# Patient Record
Sex: Female | Born: 1935 | Race: White | Hispanic: No | Smoking: Never smoker
Health system: Southern US, Community
[De-identification: ages and names within clinical notes are randomized; demographics above are authoritative.]

## PROBLEM LIST (undated history)

## (undated) DIAGNOSIS — I482 Chronic atrial fibrillation, unspecified: Secondary | ICD-10-CM

## (undated) DIAGNOSIS — I10 Essential (primary) hypertension: Secondary | ICD-10-CM

## (undated) DIAGNOSIS — M1990 Unspecified osteoarthritis, unspecified site: Secondary | ICD-10-CM

## (undated) DIAGNOSIS — S8992XA Unspecified injury of left lower leg, initial encounter: Secondary | ICD-10-CM

## (undated) DIAGNOSIS — IMO0001 Reserved for inherently not codable concepts without codable children: Secondary | ICD-10-CM

## (undated) DIAGNOSIS — D469 Myelodysplastic syndrome, unspecified: Secondary | ICD-10-CM

## (undated) DIAGNOSIS — Z5189 Encounter for other specified aftercare: Secondary | ICD-10-CM

## (undated) DIAGNOSIS — E785 Hyperlipidemia, unspecified: Secondary | ICD-10-CM

## (undated) HISTORY — PX: TONSILLECTOMY: SUR1361

## (undated) HISTORY — DX: Unspecified osteoarthritis, unspecified site: M19.90

## (undated) HISTORY — DX: Reserved for inherently not codable concepts without codable children: IMO0001

## (undated) HISTORY — DX: Myelodysplastic syndrome, unspecified: D46.9

## (undated) HISTORY — DX: Hyperlipidemia, unspecified: E78.5

## (undated) HISTORY — PX: ABDOMINAL HYSTERECTOMY: SHX81

## (undated) HISTORY — DX: Essential (primary) hypertension: I10

---

## 1998-06-17 ENCOUNTER — Other Ambulatory Visit: Admission: RE | Admit: 1998-06-17 | Discharge: 1998-06-17 | Payer: Self-pay | Admitting: Family Medicine

## 2000-05-03 ENCOUNTER — Other Ambulatory Visit: Admission: RE | Admit: 2000-05-03 | Discharge: 2000-05-03 | Payer: Self-pay | Admitting: Family Medicine

## 2002-11-26 ENCOUNTER — Encounter: Payer: Self-pay | Admitting: Family Medicine

## 2002-11-26 ENCOUNTER — Encounter: Admission: RE | Admit: 2002-11-26 | Discharge: 2002-11-26 | Payer: Self-pay | Admitting: Family Medicine

## 2007-07-31 ENCOUNTER — Ambulatory Visit (HOSPITAL_COMMUNITY): Admission: RE | Admit: 2007-07-31 | Discharge: 2007-07-31 | Payer: Self-pay | Admitting: Ophthalmology

## 2007-08-21 ENCOUNTER — Ambulatory Visit (HOSPITAL_COMMUNITY): Admission: RE | Admit: 2007-08-21 | Discharge: 2007-08-21 | Payer: Self-pay | Admitting: Ophthalmology

## 2011-07-26 LAB — BASIC METABOLIC PANEL
BUN: 14
CO2: 29
Calcium: 9.4
Chloride: 103
Creatinine, Ser: 1.19
GFR calc Af Amer: 52 — ABNORMAL LOW
GFR calc non Af Amer: 43 — ABNORMAL LOW
Glucose, Bld: 94
Potassium: 3.9
Sodium: 139

## 2011-07-26 LAB — HEMOGLOBIN AND HEMATOCRIT, BLOOD
HCT: 35.8 — ABNORMAL LOW
Hemoglobin: 12.3

## 2011-10-24 ENCOUNTER — Encounter (INDEPENDENT_AMBULATORY_CARE_PROVIDER_SITE_OTHER): Payer: Medicare Other | Admitting: *Deleted

## 2011-10-24 ENCOUNTER — Other Ambulatory Visit: Payer: Self-pay | Admitting: *Deleted

## 2011-10-24 DIAGNOSIS — R0989 Other specified symptoms and signs involving the circulatory and respiratory systems: Secondary | ICD-10-CM

## 2011-10-24 DIAGNOSIS — R42 Dizziness and giddiness: Secondary | ICD-10-CM

## 2012-02-04 ENCOUNTER — Telehealth: Payer: Self-pay | Admitting: *Deleted

## 2012-02-04 NOTE — Telephone Encounter (Signed)
patient refused to come and see dr.rubin did not want to schedule anything until she goes back to see dr.moore in July

## 2012-02-13 ENCOUNTER — Telehealth: Payer: Self-pay | Admitting: Oncology

## 2012-02-13 NOTE — Telephone Encounter (Signed)
Made a follow up call to [facility] re pt refusing new pt appt. S/w gina and per gina they did receive our notification re pt declining appt.

## 2012-09-10 ENCOUNTER — Other Ambulatory Visit: Payer: Self-pay

## 2012-09-10 ENCOUNTER — Telehealth: Payer: Self-pay | Admitting: Hematology & Oncology

## 2012-09-10 ENCOUNTER — Telehealth: Payer: Self-pay | Admitting: Oncology

## 2012-09-10 NOTE — Telephone Encounter (Signed)
Pt aware of 12-6

## 2012-09-10 NOTE — Telephone Encounter (Signed)
Pt decided to go to Dr. Gustavo Lah office

## 2012-09-10 NOTE — Telephone Encounter (Signed)
np pt referral from Dr. Diamond Nickel pt and pt stated that she will call back.

## 2012-09-19 ENCOUNTER — Ambulatory Visit: Payer: Medicare Other

## 2012-09-19 ENCOUNTER — Ambulatory Visit (HOSPITAL_BASED_OUTPATIENT_CLINIC_OR_DEPARTMENT_OTHER): Payer: Medicare Other | Admitting: Lab

## 2012-09-19 ENCOUNTER — Ambulatory Visit (HOSPITAL_BASED_OUTPATIENT_CLINIC_OR_DEPARTMENT_OTHER): Payer: Medicare Other | Admitting: Medical

## 2012-09-19 ENCOUNTER — Encounter (HOSPITAL_COMMUNITY)
Admission: RE | Admit: 2012-09-19 | Discharge: 2012-09-19 | Disposition: A | Discharge status: Home / Self Care | Payer: Medicare Other | Source: Ambulatory Visit | Attending: Hematology & Oncology | Admitting: Hematology & Oncology

## 2012-09-19 VITALS — BP 145/49 | HR 86 | Temp 98.3°F | Resp 18 | Ht 60.0 in | Wt 132.0 lb

## 2012-09-19 DIAGNOSIS — D649 Anemia, unspecified: Secondary | ICD-10-CM

## 2012-09-19 LAB — MANUAL DIFFERENTIAL (CHCC SATELLITE)
ALC: 0.9 10*3/uL (ref 0.6–2.2)
ANC (CHCC HP manual diff): 4.5 10*3/uL (ref 1.5–6.7)
Band Neutrophils: 4 % (ref 0–10)
SEG: 75 % (ref 40–75)

## 2012-09-19 LAB — CBC WITH DIFFERENTIAL (CANCER CENTER ONLY)
HCT: 21.7 % — ABNORMAL LOW (ref 34.8–46.6)
MCH: 40 pg — ABNORMAL HIGH (ref 26.0–34.0)
MCHC: 33.2 g/dL (ref 32.0–36.0)
RDW: 13.4 % (ref 11.1–15.7)

## 2012-09-19 LAB — PREPARE RBC (CROSSMATCH)

## 2012-09-19 LAB — CHCC SATELLITE - SMEAR

## 2012-09-19 NOTE — H&P (Signed)
[facility] NEW PATIENT EVALUATION   Name: Monica Riggs Date: 09/19/2012 MRN: [id number] DOB: January 17, 1919   REFERRING PHYSICIAN: Rudi Heap, M.D.  REASON FOR REFERRAL:  Anemia  HISTORY OF PRESENT ILLNESS: Monica Riggs is a 78 y.o. female who is who was kindly referred to [place] for further evaluation of her anemia.  Monica Riggs is a very pleasant elderly, female, who, for the most part, has been quite healthy.  Monica Riggs daughter and granddaughter accompany her.  Today.  Monica Riggs, reports, that she is been recently followed by her primary care physician for anemia.  Over the past several months.  Her hemoglobin has been holding steady around the 8 range.  It was felt that her anemia of was unclear of etiology, and she needed to be referred to hematologist.  Monica Riggs states, that as far.  She, knows she's never had to have a blood transfusion.  She was never told that she had any type of iron deficiency.  She reports, that she's not been symptomatic.  She does not have any syncopal spells, or dizziness.  She does not have excessive weakness, or fatigue.  She does not hear her heartbeat in her ears.  She is never craves ice.  She is able to perform our her activities of daily living without any hindrance or decline.  She does report, that over the past year.  She has had a decreased in appetite.  She has had some unintentional weight loss over the past 4 months.  She does have issues with constipation.  She denies any type of nausea, vomiting, any cough, chest pain, shortness of breath, fevers, chills, or night sweats.  She denies any palpable adenopathy.  She denies any headaches, visual changes, or rashes.  She denies any odynophagia or dysphasia.  She denies any type of lower leg swelling.  She denies any type of obvious, or abnormal bleeding.  She is 51, and gets around quite remarkable.  She still continues to do things for herself.  She does have a little bit of renal  insufficiency.  Her creatinine was 1.13.  Her LDH is normal at 168.  Her vitamin B12 is 1359.  We are getting a serum protein electrophoresis and erythropoietin level.  Her MCV is quite elevated at 121.  This could potentially signify a myelodysplastic syndrome.  Her hemoglobin today is 7.2.  I do feel that we should go ahead and transfuse her with 2 units of packed red blood cells.  This was discussed with Monica Riggs, as well as her family members.  She is agreeable to this and we will set her up for transfusion on Monday, December 9.   PAST MEDICAL HISTORY:  Hypertension, Hyperlipidemia, Hypothyroidism    PAST SURGICAL HISTORY: Hysterectomy, Tonsillectomy  Current Outpatient Prescriptions on File Prior to Visit  Medication Sig Dispense Refill  . atorvastatin (LIPITOR) 80 MG tablet Take 80 mg by mouth daily. Take 1/2 tablet (40mg ) daily      . diltiazem (TIAZAC) 300 MG 24 hr capsule Take 300 mg by mouth daily.      . ferrous sulfate 325 (65 FE) MG tablet Take 325 mg by mouth 2 (two) times daily.      . hydrochlorothiazide (HYDRODIURIL) 25 MG tablet Take 25 mg by mouth daily.      Marland Kitchen levothyroxine (SYNTHROID, LEVOTHROID) 25 MCG tablet Take 25 mcg by mouth daily.      Marland Kitchen olmesartan (BENICAR) 40 MG tablet Take 40 mg  by mouth daily. Take 1/2 talblet (20mg ) daily      . omega-3 acid ethyl esters (LOVAZA) 1 G capsule Take 2 g by mouth daily.       Peripheral smear  shows a well defined population of red blood cells. There is no  polychromasia. I do not see any nucleated red blood cells. There are  no schistocytes. She had no rouleaux formation. I saw no target cells.  There are no sickle cells. Her white cells appear normal in morphology  and maturation. She has no hypersegmented polys. There are no immature  myeloid or lymphoid forms.  I do not see any blasts. Platelets were well-  Granulated and normal size.  ALLERGIES: Sulfa drugs  SOCIAL HISTORY: She is widowed.  She has 2 twin daughters.   She lives in [place].  She does not have any history of tobacco use.  She denies any alcohol use or history of illicit drug use.  FAMILY HISTORY: Non-Contributory  Laboratory Data: White count 5.7, hemoglobin 7.2, hematocrit 21.7, MCV 121, platelets 215,000  Review of Systems: Constitutional:Negative for malaise/fatigue, fever, chills, weight loss, diaphoresis, activity change, appetite change, and unexpected weight change.  HEENT: Negative for double vision, blurred vision, visual loss, ear pain, tinnitus, congestion, rhinorrhea, epistaxis sore throat or sinus disease, oral pain/lesion, tongue soreness Respiratory: Negative for cough, chest tightness, shortness of breath, wheezing and stridor.  Cardiovascular: Negative for chest pain, palpitations, leg swelling, orthopnea, PND, DOE or claudication Gastrointestinal: Negative for nausea, vomiting, abdominal pain, diarrhea, constipation, blood in stool, melena, hematochezia, abdominal distention, anal bleeding, rectal pain, anorexia and hematemesis.  Genitourinary: Negative for dysuria, frequency, hematuria,  Musculoskeletal: Negative for myalgias, back pain, joint swelling, arthralgias and gait problem.  Skin: Negative for rash, color change, pallor and wound.  Neurological:. Negative for dizziness/light-headedness, tremors, seizures, syncope, facial asymmetry, speech difficulty, weakness, numbness, headaches and paresthesias.  Hematological: Negative for adenopathy. Does not bruise/bleed easily.  Psychiatric/Behavioral:  Negative for depression, no loss of interest in normal activity or change in sleep pattern.   Physical Exam: This is a pleasant, elderly, 78 year old, white female, in no obvious distress Vitals: Temperature 97.0 degrees, pulse 72, respirations 18, blood pressure 120/70, weight 132 pounds HEENT reveals a normocephalic, atraumatic skull, no scleral icterus, no oral lesions  Neck is supple without any cervical or  supraclavicular adenopathy.  Lungs are clear to auscultation bilaterally. There are no wheezes, rales or rhonci Cardiac is regular rate and rhythm with a normal S1 and S2. There are no murmurs, rubs, or bruits.  Abdomen is soft with good bowel sounds, there is no palpable mass. There is no palpable hepatosplenomegaly. There is no palpable fluid wave.  Musculoskeletal no tenderness of the spine, ribs, or hips.  Extremities there are no clubbing, cyanosis, or edema.  Skin no petechia, purpura or ecchymosis Neurologic is nonfocal.   Assessment/Plan: This is a pleasant, 78 year old, female, with the following issues:  #1.  Anemia.  With an elevated MCV this could indicate a vitamin B12 deficiency, severe homolysis, or myelodysplastic syndrome.  Images Gohr's case, this is more than likely MDS.  The only true way to check for MDS is with a bone marrow, biopsy.  I just think that we do not need to put Ms. Ardelean through this.  We are checking an erythropoietin level on her.  We will have to see what this level is.  She may benefit from Aranesp.  Her peripheral smear looked good.  There is no rouleaux  formation or nucleated red blood cells. We will go ahead and transfuse.  Ms. Stanwood, with 2 units of packed red blood cells on Monday.  We will await the results for the rest of her blood workAnd bring her back in a few weeks to discuss further options.  Again, Ms. Fulp is a remarkable 78 year old, female, who is quite independent and has a good quality of life.  .  #2.  Followup.  We will follow back up with Ms. Sisler in 2 weeks, but before then should there be questions or concerns.

## 2012-09-22 ENCOUNTER — Other Ambulatory Visit: Payer: Self-pay | Admitting: Lab

## 2012-09-22 ENCOUNTER — Ambulatory Visit (HOSPITAL_BASED_OUTPATIENT_CLINIC_OR_DEPARTMENT_OTHER): Payer: Medicare Other

## 2012-09-22 VITALS — BP 131/75 | HR 83 | Temp 97.2°F | Resp 18

## 2012-09-22 DIAGNOSIS — D649 Anemia, unspecified: Secondary | ICD-10-CM

## 2012-09-22 LAB — COMPREHENSIVE METABOLIC PANEL
Alkaline Phosphatase: 90 U/L (ref 39–117)
BUN: 13 mg/dL (ref 6–23)
CO2: 27 mEq/L (ref 19–32)
Glucose, Bld: 145 mg/dL — ABNORMAL HIGH (ref 70–99)
Total Bilirubin: 0.7 mg/dL (ref 0.3–1.2)

## 2012-09-22 LAB — ERYTHROPOIETIN: Erythropoietin: 718 m[IU]/mL — ABNORMAL HIGH (ref 2.6–34.0)

## 2012-09-22 LAB — LACTATE DEHYDROGENASE: LDH: 168 U/L (ref 94–250)

## 2012-09-22 LAB — VITAMIN B12: Vitamin B-12: 1359 pg/mL — ABNORMAL HIGH (ref 211–911)

## 2012-09-22 LAB — RETICULOCYTES (CHCC)
ABS Retic: 41.1 10*3/uL (ref 19.0–186.0)
Retic Ct Pct: 2.2 % (ref 0.4–2.3)

## 2012-09-22 MED ORDER — SODIUM CHLORIDE 0.9 % IV SOLN
250.0000 mL | Freq: Once | INTRAVENOUS | Status: AC
  Administered 2012-09-22: 250 mL via INTRAVENOUS

## 2012-09-22 MED ORDER — FUROSEMIDE 10 MG/ML IJ SOLN
10.0000 mg | Freq: Once | INTRAMUSCULAR | Status: AC
  Administered 2012-09-22: 10 mg via INTRAVENOUS

## 2012-09-22 MED ORDER — ACETAMINOPHEN 325 MG PO TABS: 650.0000 mg | ORAL_TABLET | Freq: Once | ORAL | Status: DC

## 2012-09-22 MED ORDER — DIPHENHYDRAMINE HCL 25 MG PO CAPS
25.0000 mg | ORAL_CAPSULE | Freq: Once | ORAL | Status: AC
  Administered 2012-09-22: 25 mg via ORAL

## 2012-09-22 MED ORDER — ACETAMINOPHEN 325 MG PO TABS
650.0000 mg | ORAL_TABLET | Freq: Once | ORAL | Status: AC
  Administered 2012-09-22: 650 mg via ORAL

## 2012-09-22 NOTE — Progress Notes (Signed)
This office note has been dictated.

## 2012-09-22 NOTE — Patient Instructions (Signed)
Blood Transfusion Information WHAT IS A BLOOD TRANSFUSION? A transfusion is the replacement of blood or some of its parts. Blood is made up of multiple cells which provide different functions.  Red blood cells carry oxygen and are used for blood loss replacement.  White blood cells fight against infection.  Platelets control bleeding.  Plasma helps clot blood.  Other blood products are available for specialized needs, such as hemophilia or other clotting disorders. BEFORE THE TRANSFUSION  Who gives blood for transfusions?   You may be able to donate blood to be used at a later date on yourself (autologous donation).  Relatives can be asked to donate blood. This is generally not any safer than if you have received blood from a stranger. The same precautions are taken to ensure safety when a relative's blood is donated.  Healthy volunteers who are fully evaluated to make sure their blood is safe. This is blood bank blood. Transfusion therapy is the safest it has ever been in the practice of medicine. Before blood is taken from a donor, a complete history is taken to make sure that person has no history of diseases nor engages in risky social behavior (examples are intravenous drug use or sexual activity with multiple partners). The donor's travel history is screened to minimize risk of transmitting infections, such as malaria. The donated blood is tested for signs of infectious diseases, such as HIV and hepatitis. The blood is then tested to be sure it is compatible with you in order to minimize the chance of a transfusion reaction. If you or a relative donates blood, this is often done in anticipation of surgery and is not appropriate for emergency situations. It takes many days to process the donated blood. RISKS AND COMPLICATIONS Although transfusion therapy is very safe and saves many lives, the main dangers of transfusion include:   Getting an infectious disease.  Developing a  transfusion reaction. This is an allergic reaction to something in the blood you were given. Every precaution is taken to prevent this. The decision to have a blood transfusion has been considered carefully by your caregiver before blood is given. Blood is not given unless the benefits outweigh the risks. AFTER THE TRANSFUSION  Right after receiving a blood transfusion, you will usually feel much better and more energetic. This is especially true if your red blood cells have gotten low (anemic). The transfusion raises the level of the red blood cells which carry oxygen, and this usually causes an energy increase.  The nurse administering the transfusion will monitor you carefully for complications. HOME CARE INSTRUCTIONS  No special instructions are needed after a transfusion. You may find your energy is better. Speak with your caregiver about any limitations on activity for underlying diseases you may have. SEEK MEDICAL CARE IF:   Your condition is not improving after your transfusion.  You develop redness or irritation at the intravenous (IV) site. SEEK IMMEDIATE MEDICAL CARE IF:  Any of the following symptoms occur over the next 12 hours:  Shaking chills.  You have a temperature by mouth above 102 F (38.9 C), not controlled by medicine.  Chest, back, or muscle pain.  People around you feel you are not acting correctly or are confused.  Shortness of breath or difficulty breathing.  Dizziness and fainting.  You get a rash or develop hives.  You have a decrease in urine output.  Your urine turns a dark color or changes to pink, red, or brown. Any of the following   symptoms occur over the next 10 days:  You have a temperature by mouth above 102 F (38.9 C), not controlled by medicine.  Shortness of breath.  Weakness after normal activity.  The white part of the eye turns yellow (jaundice).  You have a decrease in the amount of urine or are urinating less often.  Your  urine turns a dark color or changes to pink, red, or brown. Document Released: 09/28/2000 Document Revised: 12/24/2011 Document Reviewed: 05/17/2008 Exit[employer] Patient Information 2013 [employer].  

## 2012-09-24 LAB — PROTEIN ELECTROPHORESIS, SERUM, WITH REFLEX
Alpha-1-Globulin: 6.4 % — ABNORMAL HIGH (ref 2.9–4.9)
Beta 2: 4.6 % (ref 3.2–6.5)
Gamma Globulin: 12.8 % (ref 11.1–18.8)

## 2012-09-24 LAB — TYPE AND SCREEN
ABO/RH(D): O NEG
Antibody Screen: NEGATIVE
Unit division: 0

## 2012-09-25 ENCOUNTER — Encounter: Payer: Self-pay | Admitting: Hematology & Oncology

## 2012-10-06 ENCOUNTER — Other Ambulatory Visit (HOSPITAL_BASED_OUTPATIENT_CLINIC_OR_DEPARTMENT_OTHER): Payer: Medicare Other | Admitting: Lab

## 2012-10-06 ENCOUNTER — Ambulatory Visit (HOSPITAL_BASED_OUTPATIENT_CLINIC_OR_DEPARTMENT_OTHER): Payer: Medicare Other | Admitting: Hematology & Oncology

## 2012-10-06 VITALS — BP 129/57 | HR 73 | Temp 97.9°F | Resp 16 | Ht 60.0 in | Wt 127.0 lb

## 2012-10-06 DIAGNOSIS — D46Z Other myelodysplastic syndromes: Secondary | ICD-10-CM

## 2012-10-06 DIAGNOSIS — D649 Anemia, unspecified: Secondary | ICD-10-CM

## 2012-10-06 LAB — CBC WITH DIFFERENTIAL (CANCER CENTER ONLY)
HCT: 28.7 % — ABNORMAL LOW (ref 34.8–46.6)
HGB: 9.6 g/dL — ABNORMAL LOW (ref 11.6–15.9)
MCV: 103 fL — ABNORMAL HIGH (ref 81–101)
RBC: 2.79 10*6/uL — ABNORMAL LOW (ref 3.70–5.32)
WBC: 3.3 10*3/uL — ABNORMAL LOW (ref 3.9–10.0)

## 2012-10-06 LAB — MANUAL DIFFERENTIAL (CHCC SATELLITE)
ALC: 0.9 10*3/uL (ref 0.6–2.2)
Eos: 6 % (ref 0–7)
LYMPH: 27 % (ref 14–48)
MONO: 4 % (ref 0–13)

## 2012-10-06 LAB — RETICULOCYTES (CHCC)
RBC.: 2.88 MIL/uL — ABNORMAL LOW (ref 3.87–5.11)
Retic Ct Pct: 0.9 % (ref 0.4–2.3)

## 2012-10-06 NOTE — Progress Notes (Signed)
This office note has been dictated.

## 2012-10-07 NOTE — Progress Notes (Signed)
CC:   Monica Riggs, M.D.  DIAGNOSIS:  Myelodysplastic syndrome.  CURRENT THERAPY:  Palliative transfusions.  INTERIM HISTORY:  Monica Riggs comes in for followup.  This is her second office visit.  We first saw her back in early December.  At that point in time, our workup basically showed myelodysplastic issues.  She had a vitamin B12 of 1400.  LDH was 168.  She had a negative monoclonal spike in her serum.  She had an erythropoietin level of 718.  Her reticulocyte count, corrected, was less than 1.  Again, I suspect that Monica Riggs has myelodysplasia.  Her blood smear does not show any blasts.  There are no nucleated red blood cells or teardrop cells.  She has gotten 2 units of blood already.  This has helped her.  She feels better for the holidays.  She has had no bleeding.  Her appetite is okay.  She has had no change in bowel or bladder habits.  She has had some leg swelling.  I told her and her family this was likely from her anemia.  PHYSICAL EXAMINATION:  General:  This is an elderly white female in no obvious distress.  Vital signs:  97.9, pulse of 73, respiratory rate 16, blood pressure is 129/57.  Weight is 127.  Head and neck: Normocephalic, atraumatic skull.  There are no ocular or oral lesions. There are no palpable cervical or supraclavicular lymph nodes.  Lungs: Clear bilaterally.  Cardiac:  Regular rate and rhythm with a normal S1 and S2.  There are no murmurs, rubs, or bruits.  Abdomen:  Soft with good bowel sounds.  There is no palpable abdominal mass.  No palpable hepatosplenomegaly.  Extremities:  Some trace edema in her legs.  She has good range of motion of her joints.  Skin:  No rashes, ecchymoses, or petechiae.  LABORATORY STUDIES:  White cell count is 3.3, hemoglobin 9.6, hematocrit 28.7, platelet count 221.  MCV is 103.  IMPRESSION:  Monica Riggs is a 78 year old female with, highly likely, myelodysplasia.  Again, I just do not see that putting her  through a bone marrow test is going to change our management decisions.  Given her age, I just feel that transfusing her as indicated would be appropriate.  She is in agreement with this.  Her family also agrees.  Everything with her lab work and her blood smear is pointing to myelodysplasia.  She has a reticulocyte count that is incredibly low. Her blood smear does not show any obvious leukemic changes.  Again, quality of life is key.  I told Monica Riggs to take some over-the- counter folic acid and vitamin B12.  I do not see how this is going to hurt her.  I think she still needs to have her blood work checked every couple weeks.  Her family doctor can do this and just fax [place] the results.  I gave her granddaughter a prescription for this to give to her family doctor.  I want to see her back in about 2 months' time.  We can see her back sooner, if necessary.    ______________________________ Josph Macho, M.D. PRE/MEDQ  D:  10/06/2012  T:  10/07/2012  Job:  [id number]

## 2012-12-08 ENCOUNTER — Ambulatory Visit (HOSPITAL_BASED_OUTPATIENT_CLINIC_OR_DEPARTMENT_OTHER): Payer: Medicare Other | Admitting: Hematology & Oncology

## 2012-12-08 ENCOUNTER — Encounter (HOSPITAL_COMMUNITY)
Admission: RE | Admit: 2012-12-08 | Discharge: 2012-12-08 | Disposition: A | Discharge status: Home / Self Care | Payer: Medicare Other | Source: Ambulatory Visit | Attending: Hematology & Oncology | Admitting: Hematology & Oncology

## 2012-12-08 ENCOUNTER — Other Ambulatory Visit (HOSPITAL_BASED_OUTPATIENT_CLINIC_OR_DEPARTMENT_OTHER): Payer: Medicare Other | Admitting: Lab

## 2012-12-08 VITALS — BP 150/64 | HR 89 | Temp 97.5°F | Resp 16 | Ht 61.0 in | Wt 133.0 lb

## 2012-12-08 DIAGNOSIS — D4620 Refractory anemia with excess of blasts, unspecified: Secondary | ICD-10-CM

## 2012-12-08 DIAGNOSIS — D46Z Other myelodysplastic syndromes: Secondary | ICD-10-CM

## 2012-12-08 LAB — RETICULOCYTES (CHCC)
RBC.: 2.17 MIL/uL — ABNORMAL LOW (ref 3.87–5.11)
Retic Ct Pct: 2 % (ref 0.4–2.3)

## 2012-12-08 LAB — CBC WITH DIFFERENTIAL (CANCER CENTER ONLY)
HCT: 22.8 % — ABNORMAL LOW (ref 34.8–46.6)
MCH: 37.6 pg — ABNORMAL HIGH (ref 26.0–34.0)
MCV: 113 fL — ABNORMAL HIGH (ref 81–101)
RBC: 2.02 10*6/uL — ABNORMAL LOW (ref 3.70–5.32)
RDW: 19.9 % — ABNORMAL HIGH (ref 11.1–15.7)
WBC: 2.7 10*3/uL — ABNORMAL LOW (ref 3.9–10.0)

## 2012-12-08 LAB — MANUAL DIFFERENTIAL (CHCC SATELLITE)
ANC (CHCC HP manual diff): 1.6 10*3/uL (ref 1.5–6.7)
BASO: 2 % (ref 0–2)
LYMPH: 26 % (ref 14–48)
MONO: 5 % (ref 0–13)
Myelocytes: 3 % — ABNORMAL HIGH (ref 0–0)
PLT EST ~~LOC~~: ADEQUATE

## 2012-12-08 LAB — HOLD TUBE, BLOOD BANK - CHCC SATELLITE

## 2012-12-08 NOTE — Progress Notes (Signed)
This office note has been dictated.

## 2012-12-09 ENCOUNTER — Ambulatory Visit (HOSPITAL_BASED_OUTPATIENT_CLINIC_OR_DEPARTMENT_OTHER): Payer: Medicare Other

## 2012-12-09 VITALS — BP 166/79 | HR 79 | Temp 97.8°F | Resp 18

## 2012-12-09 DIAGNOSIS — D649 Anemia, unspecified: Secondary | ICD-10-CM

## 2012-12-09 DIAGNOSIS — D4620 Refractory anemia with excess of blasts, unspecified: Secondary | ICD-10-CM

## 2012-12-09 LAB — PREPARE RBC (CROSSMATCH)

## 2012-12-09 MED ORDER — SODIUM CHLORIDE 0.9 % IJ SOLN
10.0000 mL | INTRAMUSCULAR | Status: DC | PRN
  Filled 2012-12-09: qty 10

## 2012-12-09 MED ORDER — SODIUM CHLORIDE 0.9 % IV SOLN
250.0000 mL | Freq: Once | INTRAVENOUS | Status: AC
  Administered 2012-12-09: 250 mL via INTRAVENOUS

## 2012-12-09 MED ORDER — FUROSEMIDE 10 MG/ML IJ SOLN
10.0000 mg | Freq: Once | INTRAMUSCULAR | Status: AC
  Administered 2012-12-09: 10 mg via INTRAVENOUS

## 2012-12-09 MED ORDER — ACETAMINOPHEN 325 MG PO TABS
650.0000 mg | ORAL_TABLET | Freq: Once | ORAL | Status: AC
  Administered 2012-12-09: 650 mg via ORAL

## 2012-12-09 NOTE — Progress Notes (Signed)
CC:   Ernestina Penna, M.D.  DIAGNOSIS:  Myelodysplasia-refractory anemia.  CURRENT THERAPY:  Supportive transfusions as indicated.  INTERIM HISTORY:  Monica Riggs comes in for followup.  We last saw her a couple months ago.  She got a transfusion back in early December and did well with it.  She has noticed a little bit more in the way of leg swelling.  She has had a slight increase in fatigue.  There has been no cough.  She has had no fever.  She has not noticed any bleeding.  There has been no bony pain.  She has noticed some slight swelling in her lower legs.  PHYSICAL EXAMINATION:  General:  This is a elderly white female in no obvious distress.  Vital signs:  Temperature of 97.5, pulse 89, respiratory rate 16, blood pressure 150/64.  Weight is 133.  Head and neck:  Normocephalic, atraumatic skull.  There are no ocular or oral lesions.  There are no palpable cervical or supraclavicular lymph nodes. Lungs:  Clear bilaterally.  Cardiac:  Regular rate and rhythm with a normal S1 and S2.  There are no murmurs, rubs, or bruits.  Abdomen: Soft with good bowel sounds.  There is no palpable abdominal mass. There is no palpable hepatosplenomegaly.  Extremities:  Some trace edema in her lower legs.  She has good pulses in her distal extremities. Skin:  No rashes, ecchymoses, or petechiae.  LABORATORY STUDIES:  White cell count is 2.7, hemoglobin 7.6, hematocrit 22.8, platelet count is 215.  MCV is 113.  IMPRESSION:  Ms. Fike is a 79 year old white female with low-grade myelodysplasia.  Because of her age, we have not had to do a bone marrow biopsy on her.  I felt that just transfusing her supportively would be appropriate.  Her erythropoietin level is quite high, so she would not respond to ESA.  We will go ahead and transfuse her tomorrow.  I think this will make her feel better.  We will have her come back in about 3 weeks or so.  She should be able to respond nicely to the  blood as she has had only 1 transfusion to- date.    ______________________________ Josph Macho, M.D. PRE/MEDQ  D:  12/08/2012  T:  12/09/2012  Job:  [id number]

## 2012-12-09 NOTE — Patient Instructions (Signed)
Blood Transfusion Information WHAT IS A BLOOD TRANSFUSION? A transfusion is the replacement of blood or some of its parts. Blood is made up of multiple cells which provide different functions.  Red blood cells carry oxygen and are used for blood loss replacement.  White blood cells fight against infection.  Platelets control bleeding.  Plasma helps clot blood.  Other blood products are available for specialized needs, such as hemophilia or other clotting disorders. BEFORE THE TRANSFUSION  Who gives blood for transfusions?   You may be able to donate blood to be used at a later date on yourself (autologous donation).  Relatives can be asked to donate blood. This is generally not any safer than if you have received blood from a stranger. The same precautions are taken to ensure safety when a relative's blood is donated.  Healthy volunteers who are fully evaluated to make sure their blood is safe. This is blood bank blood. Transfusion therapy is the safest it has ever been in the practice of medicine. Before blood is taken from a donor, a complete history is taken to make sure that person has no history of diseases nor engages in risky social behavior (examples are intravenous drug use or sexual activity with multiple partners). The donor's travel history is screened to minimize risk of transmitting infections, such as malaria. The donated blood is tested for signs of infectious diseases, such as HIV and hepatitis. The blood is then tested to be sure it is compatible with you in order to minimize the chance of a transfusion reaction. If you or a relative donates blood, this is often done in anticipation of surgery and is not appropriate for emergency situations. It takes many days to process the donated blood. RISKS AND COMPLICATIONS Although transfusion therapy is very safe and saves many lives, the main dangers of transfusion include:   Getting an infectious disease.  Developing a  transfusion reaction. This is an allergic reaction to something in the blood you were given. Every precaution is taken to prevent this. The decision to have a blood transfusion has been considered carefully by your caregiver before blood is given. Blood is not given unless the benefits outweigh the risks. AFTER THE TRANSFUSION  Right after receiving a blood transfusion, you will usually feel much better and more energetic. This is especially true if your red blood cells have gotten low (anemic). The transfusion raises the level of the red blood cells which carry oxygen, and this usually causes an energy increase.  The nurse administering the transfusion will monitor you carefully for complications. HOME CARE INSTRUCTIONS  No special instructions are needed after a transfusion. You may find your energy is better. Speak with your caregiver about any limitations on activity for underlying diseases you may have. SEEK MEDICAL CARE IF:   Your condition is not improving after your transfusion.  You develop redness or irritation at the intravenous (IV) site. SEEK IMMEDIATE MEDICAL CARE IF:  Any of the following symptoms occur over the next 12 hours:  Shaking chills.  You have a temperature by mouth above 102 F (38.9 C), not controlled by medicine.  Chest, back, or muscle pain.  People around you feel you are not acting correctly or are confused.  Shortness of breath or difficulty breathing.  Dizziness and fainting.  You get a rash or develop hives.  You have a decrease in urine output.  Your urine turns a dark color or changes to pink, red, or brown. Any of the following   symptoms occur over the next 10 days:  You have a temperature by mouth above 102 F (38.9 C), not controlled by medicine.  Shortness of breath.  Weakness after normal activity.  The white part of the eye turns yellow (jaundice).  You have a decrease in the amount of urine or are urinating less often.  Your  urine turns a dark color or changes to pink, red, or brown. Document Released: 09/28/2000 Document Revised: 12/24/2011 Document Reviewed: 05/17/2008 Exit[employer] Patient Information 2013 [employer].  

## 2012-12-10 LAB — TYPE AND SCREEN: Unit division: 0

## 2012-12-25 ENCOUNTER — Telehealth: Payer: Self-pay | Admitting: Hematology & Oncology

## 2012-12-25 NOTE — Telephone Encounter (Signed)
Pt moved 3-24 to 3-31

## 2013-01-05 ENCOUNTER — Other Ambulatory Visit: Payer: Medicare Other | Admitting: Lab

## 2013-01-05 ENCOUNTER — Ambulatory Visit: Payer: Medicare Other | Admitting: Medical

## 2013-01-12 ENCOUNTER — Other Ambulatory Visit (HOSPITAL_BASED_OUTPATIENT_CLINIC_OR_DEPARTMENT_OTHER): Payer: Medicare Other | Admitting: Lab

## 2013-01-12 ENCOUNTER — Ambulatory Visit (HOSPITAL_BASED_OUTPATIENT_CLINIC_OR_DEPARTMENT_OTHER): Payer: Medicare Other | Admitting: Medical

## 2013-01-12 VITALS — BP 121/60 | HR 69 | Temp 97.4°F | Resp 16 | Ht 61.0 in | Wt 130.0 lb

## 2013-01-12 DIAGNOSIS — D4620 Refractory anemia with excess of blasts, unspecified: Secondary | ICD-10-CM

## 2013-01-12 DIAGNOSIS — D46Z Other myelodysplastic syndromes: Secondary | ICD-10-CM

## 2013-01-12 LAB — MANUAL DIFFERENTIAL (CHCC SATELLITE)
BASO: 1 % (ref 0–2)
Eos: 6 % (ref 0–7)
MONO: 6 % (ref 0–13)
PLT EST ~~LOC~~: ADEQUATE
SEG: 63 % (ref 40–75)

## 2013-01-12 LAB — CBC WITH DIFFERENTIAL (CANCER CENTER ONLY)
HGB: 9 g/dL — ABNORMAL LOW (ref 11.6–15.9)
MCH: 35.7 pg — ABNORMAL HIGH (ref 26.0–34.0)
Platelets: 170 10*3/uL (ref 145–400)
RBC: 2.52 10*6/uL — ABNORMAL LOW (ref 3.70–5.32)
WBC: 2.5 10*3/uL — ABNORMAL LOW (ref 3.9–10.0)

## 2013-01-12 LAB — HOLD TUBE, BLOOD BANK - CHCC SATELLITE

## 2013-01-12 LAB — CHCC SATELLITE - SMEAR

## 2013-01-12 LAB — RETICULOCYTES (CHCC)
ABS Retic: 41.8 10*3/uL (ref 19.0–186.0)
RBC.: 2.61 MIL/uL — ABNORMAL LOW (ref 3.87–5.11)
Retic Ct Pct: 1.6 % (ref 0.4–2.3)

## 2013-01-12 NOTE — Progress Notes (Signed)
DIAGNOSIS:  Myelodysplastic syndrome.  CURRENT THERAPY:  Palliative transfusions.  INTERIM HISTORY: Monica Riggs presents today for an office followup visit. .  Overall, she, reports, that she's doing well.  She still continues to remain active.  The last, time, she received a blood transfusion was February, 24th 2014.  Her hemoglobin today is 9.0.  She goes every few weeks to her primary care physician for lab work, and they faxed it over to [place].  Her blood, has been holding relatively stable since her last transfusion.  She's not complaining of any increase in fatigue, or weakness.  She's not reporting any type of shortness of breath.  She has a good appetite.  She denies any nausea, vomiting, diarrhea, constipation, chest pain, shortness of breath, or cough.  She denies any fevers, chills, or night sweats.  She denies any lower leg swelling.  She denies any obvious, or abnormal bleeding.  She denies any headaches, visual changes, or rashes.  Review of Systems: Constitutional:Negative for malaise/fatigue, fever, chills, weight loss, diaphoresis, activity change, appetite change, and unexpected weight change.  HEENT: Negative for double vision, blurred vision, visual loss, ear pain, tinnitus, congestion, rhinorrhea, epistaxis sore throat or sinus disease, oral pain/lesion, tongue soreness Respiratory: Negative for cough, chest tightness, shortness of breath, wheezing and stridor.  Cardiovascular: Negative for chest pain, palpitations, leg swelling, orthopnea, PND, DOE or claudication Gastrointestinal: Negative for nausea, vomiting, abdominal pain, diarrhea, constipation, blood in stool, melena, hematochezia, abdominal distention, anal bleeding, rectal pain, anorexia and hematemesis.  Genitourinary: Negative for dysuria, frequency, hematuria,  Musculoskeletal: Negative for myalgias, back pain, joint swelling, arthralgias and gait problem.  Skin: Negative for rash, color change, pallor and wound.   Neurological:. Negative for dizziness/light-headedness, tremors, seizures, syncope, facial asymmetry, speech difficulty, weakness, numbness, headaches and paresthesias.  Hematological: Negative for adenopathy. Does not bruise/bleed easily.  Psychiatric/Behavioral:  Negative for depression, no loss of interest in normal activity or change in sleep pattern.   Physical Exam: This is a pleasant, elderly, 79 year old, white female, in no obvious distress Vitals: temperature 97.4 degrees, pulse 69, respirations 16, blood pressure 121/60, weight 130 pounds  HEENT reveals a normocephalic, atraumatic skull, no scleral icterus, no oral lesions  Neck is supple without any cervical or supraclavicular adenopathy.  Lungs are clear to auscultation bilaterally. There are no wheezes, rales or rhonci Cardiac is regular rate and rhythm with a normal S1 and S2. There are no murmurs, rubs, or bruits.  Abdomen is soft with good bowel sounds, there is no palpable mass. There is no palpable hepatosplenomegaly. There is no palpable fluid wave.  Musculoskeletal no tenderness of the spine, ribs, or hips.  Extremities there are no clubbing, cyanosis, or edema.  Skin no petechia, purpura or ecchymosis Neurologic is nonfocal.  Laboratory Data:  white count 2.4, hemoglobin 9.0, hematocrit 26.5.  Platelets 170,000   Current Outpatient Prescriptions on File Prior to Visit  Medication Sig Dispense Refill  . aspirin 325 MG EC tablet Take 325 mg by mouth daily.      Marland Kitchen atorvastatin (LIPITOR) 80 MG tablet Take 80 mg by mouth daily. Take 1/2 tablet (40mg ) daily      . brimonidine (ALPHAGAN) 0.2 % ophthalmic solution Place 1 drop into both eyes 2 (two) times daily.      . cholecalciferol (VITAMIN D) 1000 UNITS tablet Take 1,000 Units by mouth daily.      Marland Kitchen co-enzyme Q-10 50 MG capsule Take 50 mg by mouth daily.      Marland Kitchen  Cyanocobalamin (VITAMIN B 12 PO) Take 1,000 mg by mouth every morning.      . diltiazem (TIAZAC) 300 MG 24 hr  capsule Take 300 mg by mouth daily.      . ferrous sulfate 325 (65 FE) MG tablet Take 325 mg by mouth 2 (two) times daily.      . folic acid (FOLVITE) 400 MCG tablet Take 400 mcg by mouth 2 (two) times daily.      . hydrochlorothiazide (HYDRODIURIL) 25 MG tablet Take 25 mg by mouth daily.      Marland Kitchen levothyroxine (SYNTHROID, LEVOTHROID) 25 MCG tablet Take 25 mcg by mouth daily.      Marland Kitchen olmesartan (BENICAR) 40 MG tablet Take 40 mg by mouth daily. Take 1/2 talblet (20mg ) daily      . omega-3 acid ethyl esters (LOVAZA) 1 G capsule Take 2 g by mouth daily.       No current facility-administered medications on file prior to visit.   Assessment/Plan: This is a pleasant, 79 year old, white female, with the following issues:  #1.  Low-grade myelodysplasia.  Again, secondary to her age, we decided not to do a bone marrow, biopsy on her.  She is agreeable to supportive transfusions only. again, she's not had to receive a blood transfusion in about one month.  Her hemoglobin has remained relatively stable.  She will continue to go to her primary care physician for lab work.  She will followup there in one week.  We will continue to monitor her blood counts closely.  #2.  Followup.  We'll follow back up with Monica Riggs in 3-4 weeks, but before then should there be questions or concerns.

## 2013-01-20 ENCOUNTER — Other Ambulatory Visit (INDEPENDENT_AMBULATORY_CARE_PROVIDER_SITE_OTHER): Payer: Medicare Other

## 2013-01-20 DIAGNOSIS — Q068 Other specified congenital malformations of spinal cord: Secondary | ICD-10-CM

## 2013-01-20 NOTE — Progress Notes (Signed)
Ordered by outside dr,([email or web address])

## 2013-01-21 LAB — CBC WITH DIFFERENTIAL/PLATELET
Basophils Relative: 3 % — ABNORMAL HIGH (ref 0–1)
Eosinophils Absolute: 0.2 10*3/uL (ref 0.0–0.7)
Eosinophils Relative: 6 % — ABNORMAL HIGH (ref 0–5)
Lymphs Abs: 0.7 10*3/uL (ref 0.7–4.0)
MCH: 35.2 pg — ABNORMAL HIGH (ref 26.0–34.0)
MCHC: 34.2 g/dL (ref 30.0–36.0)
MCV: 102.8 fL — ABNORMAL HIGH (ref 78.0–100.0)
Monocytes Relative: 10 % (ref 3–12)
Neutrophils Relative %: 54 % (ref 43–77)
Platelets: 205 10*3/uL (ref 150–400)
RBC: 2.53 MIL/uL — ABNORMAL LOW (ref 3.87–5.11)

## 2013-01-21 NOTE — Progress Notes (Signed)
Patient came in had order from another doctor

## 2013-01-26 ENCOUNTER — Telehealth: Payer: Self-pay | Admitting: *Deleted

## 2013-01-26 NOTE — Telephone Encounter (Signed)
Notified Belenda Cruise of results Copy sent to Dr Myna Hidalgo

## 2013-02-09 ENCOUNTER — Other Ambulatory Visit (INDEPENDENT_AMBULATORY_CARE_PROVIDER_SITE_OTHER): Payer: Medicare Other

## 2013-02-09 DIAGNOSIS — R5381 Other malaise: Secondary | ICD-10-CM

## 2013-02-09 DIAGNOSIS — R5383 Other fatigue: Secondary | ICD-10-CM

## 2013-02-09 LAB — CBC WITH DIFFERENTIAL/PLATELET
Basophils Absolute: 0.1 10*3/uL (ref 0.0–0.1)
Basophils Relative: 2 % — ABNORMAL HIGH (ref 0–1)
Eosinophils Relative: 8 % — ABNORMAL HIGH (ref 0–5)
HCT: 23.9 % — ABNORMAL LOW (ref 36.0–46.0)
MCHC: 34.3 g/dL (ref 30.0–36.0)
MCV: 104.4 fL — ABNORMAL HIGH (ref 78.0–100.0)
Monocytes Absolute: 0.2 10*3/uL (ref 0.1–1.0)
Platelets: 220 10*3/uL (ref 150–400)
RDW: 21.1 % — ABNORMAL HIGH (ref 11.5–15.5)

## 2013-02-09 NOTE — Progress Notes (Signed)
Fax to dr,ennever

## 2013-02-12 ENCOUNTER — Other Ambulatory Visit: Payer: Self-pay | Admitting: Medical

## 2013-02-12 ENCOUNTER — Other Ambulatory Visit: Payer: Medicare Other | Admitting: Lab

## 2013-02-12 ENCOUNTER — Ambulatory Visit: Payer: Medicare Other | Admitting: Hematology & Oncology

## 2013-02-19 ENCOUNTER — Ambulatory Visit (HOSPITAL_BASED_OUTPATIENT_CLINIC_OR_DEPARTMENT_OTHER): Payer: Medicare Other | Admitting: Hematology & Oncology

## 2013-02-19 ENCOUNTER — Other Ambulatory Visit (HOSPITAL_BASED_OUTPATIENT_CLINIC_OR_DEPARTMENT_OTHER): Payer: Medicare Other | Admitting: Lab

## 2013-02-19 ENCOUNTER — Encounter (HOSPITAL_COMMUNITY)
Admission: RE | Admit: 2013-02-19 | Discharge: 2013-02-19 | Disposition: A | Discharge status: Home / Self Care | Payer: Medicare Other | Source: Ambulatory Visit | Attending: Hematology & Oncology | Admitting: Hematology & Oncology

## 2013-02-19 VITALS — BP 134/57 | HR 73 | Temp 97.6°F | Resp 16 | Ht 61.0 in | Wt 131.0 lb

## 2013-02-19 DIAGNOSIS — Q068 Other specified congenital malformations of spinal cord: Secondary | ICD-10-CM | POA: Insufficient documentation

## 2013-02-19 DIAGNOSIS — D4620 Refractory anemia with excess of blasts, unspecified: Secondary | ICD-10-CM

## 2013-02-19 LAB — MANUAL DIFFERENTIAL (CHCC SATELLITE)
Band Neutrophils: 7 % (ref 0–10)
Eos: 7 % (ref 0–7)
LYMPH: 26 % (ref 14–48)
MONO: 10 % (ref 0–13)
PLT EST ~~LOC~~: ADEQUATE
SEG: 48 % (ref 40–75)

## 2013-02-19 LAB — CBC WITH DIFFERENTIAL (CANCER CENTER ONLY)
HCT: 24 % — ABNORMAL LOW (ref 34.8–46.6)
HGB: 7.9 g/dL — ABNORMAL LOW (ref 11.6–15.9)
MCH: 37.3 pg — ABNORMAL HIGH (ref 26.0–34.0)
MCHC: 32.9 g/dL (ref 32.0–36.0)
MCV: 113 fL — ABNORMAL HIGH (ref 81–101)

## 2013-02-19 LAB — PREPARE RBC (CROSSMATCH)

## 2013-02-19 NOTE — Progress Notes (Signed)
This office note has been dictated.

## 2013-02-20 ENCOUNTER — Ambulatory Visit (HOSPITAL_BASED_OUTPATIENT_CLINIC_OR_DEPARTMENT_OTHER): Payer: Medicare Other

## 2013-02-20 VITALS — BP 116/72 | HR 74 | Temp 97.0°F | Resp 20

## 2013-02-20 DIAGNOSIS — D4620 Refractory anemia with excess of blasts, unspecified: Secondary | ICD-10-CM

## 2013-02-20 LAB — PREPARE RBC (CROSSMATCH)

## 2013-02-20 MED ORDER — FUROSEMIDE 10 MG/ML IJ SOLN
10.0000 mg | Freq: Once | INTRAMUSCULAR | Status: AC
  Administered 2013-02-20: 10 mg via INTRAVENOUS

## 2013-02-20 MED ORDER — SODIUM CHLORIDE 0.9 % IV SOLN
250.0000 mL | Freq: Once | INTRAVENOUS | Status: AC
  Administered 2013-02-20: 250 mL via INTRAVENOUS

## 2013-02-20 MED ORDER — ACETAMINOPHEN 325 MG PO TABS
650.0000 mg | ORAL_TABLET | Freq: Once | ORAL | Status: AC
  Administered 2013-02-20: 650 mg via ORAL

## 2013-02-20 NOTE — Progress Notes (Signed)
CC:   Ernestina Penna, M.D.  DIAGNOSIS:  Myelodysplasia, refractory anemia.  CURRENT THERAPY:  Palliative transfusions.  INTERIM HISTORY:  Ms. Dekoning comes in for followup.  She is actually doing fairly well.  She has not been transfused for 3 months.  She does feel a little bit tired.  She has had no bleeding.  There has been no fever.  She has had no nausea or vomiting.  She has had a good appetite.  She does have some leg swelling.  This mostly is during the daytime.  Her medicines have not changed at all.  She is Synthroid for hypothyroidism.  PHYSICAL EXAMINATION:  General:  This is an elderly white female in no obvious distress.  Vital signs:  Show temperature of 97.6, pulse 73, respiratory rate 16, blood pressure 134/57.  Weight is 131.  Head and neck:  Shows a normocephalic, atraumatic skull.  There are no ocular or oral lesions.  There are no palpable cervical or supraclavicular lymph nodes.  Lungs:  Clear bilaterally.  Cardiac:  Regular rate and rhythm with a normal S1, S2.  She has a 1/6 systolic ejection murmur.  Abdomen: Soft with good bowel sounds.  There is no palpable abdominal mass. There is no palpable hepatosplenomegaly.  Extremities:  Show some trace edema in her legs.  Neurological:  Shows a little bit of a tremor.  LABORATORY STUDIES:  White cell count is 2.1, hemoglobin 7.9, hematocrit 24, platelet count 257.  Peripheral smear does not show any blasts.  She has no nucleated red blood cells.  I see no hypersegmented polys.  Platelets are adequate in number and size.  There is no rouleaux formation.  IMPRESSION:  Ms. Perrault is a very nice 79 year old white female with myelodysplasia.  She has refractory anemia.  We have not yet done a bone marrow biopsy on her.  I just do not think that we would benefit from doing a bone marrow biopsy.  I am sure she has abnormal cytogenetics but that really would not change our management of her.  We will go ahead and  transfuse her tomorrow.  This I think will hopefully be able to get her through the summer time.  I will plan to see her back in another 6 weeks.    ______________________________ Josph Macho, M.D. PRE/MEDQ  D:  02/19/2013  T:  02/20/2013  Job:  [id number]

## 2013-02-20 NOTE — Patient Instructions (Signed)
Blood Transfusion Information WHAT IS A BLOOD TRANSFUSION? A transfusion is the replacement of blood or some of its parts. Blood is made up of multiple cells which provide different functions.  Red blood cells carry oxygen and are used for blood loss replacement.  White blood cells fight against infection.  Platelets control bleeding.  Plasma helps clot blood.  Other blood products are available for specialized needs, such as hemophilia or other clotting disorders. BEFORE THE TRANSFUSION  Who gives blood for transfusions?   You may be able to donate blood to be used at a later date on yourself (autologous donation).  Relatives can be asked to donate blood. This is generally not any safer than if you have received blood from a stranger. The same precautions are taken to ensure safety when a relative's blood is donated.  Healthy volunteers who are fully evaluated to make sure their blood is safe. This is blood bank blood. Transfusion therapy is the safest it has ever been in the practice of medicine. Before blood is taken from a donor, a complete history is taken to make sure that person has no history of diseases nor engages in risky social behavior (examples are intravenous drug use or sexual activity with multiple partners). The donor's travel history is screened to minimize risk of transmitting infections, such as malaria. The donated blood is tested for signs of infectious diseases, such as HIV and hepatitis. The blood is then tested to be sure it is compatible with you in order to minimize the chance of a transfusion reaction. If you or a relative donates blood, this is often done in anticipation of surgery and is not appropriate for emergency situations. It takes many days to process the donated blood. RISKS AND COMPLICATIONS Although transfusion therapy is very safe and saves many lives, the main dangers of transfusion include:   Getting an infectious disease.  Developing a  transfusion reaction. This is an allergic reaction to something in the blood you were given. Every precaution is taken to prevent this. The decision to have a blood transfusion has been considered carefully by your caregiver before blood is given. Blood is not given unless the benefits outweigh the risks. AFTER THE TRANSFUSION  Right after receiving a blood transfusion, you will usually feel much better and more energetic. This is especially true if your red blood cells have gotten low (anemic). The transfusion raises the level of the red blood cells which carry oxygen, and this usually causes an energy increase.  The nurse administering the transfusion will monitor you carefully for complications. HOME CARE INSTRUCTIONS  No special instructions are needed after a transfusion. You may find your energy is better. Speak with your caregiver about any limitations on activity for underlying diseases you may have. SEEK MEDICAL CARE IF:   Your condition is not improving after your transfusion.  You develop redness or irritation at the intravenous (IV) site. SEEK IMMEDIATE MEDICAL CARE IF:  Any of the following symptoms occur over the next 12 hours:  Shaking chills.  You have a temperature by mouth above 102 F (38.9 C), not controlled by medicine.  Chest, back, or muscle pain.  People around you feel you are not acting correctly or are confused.  Shortness of breath or difficulty breathing.  Dizziness and fainting.  You get a rash or develop hives.  You have a decrease in urine output.  Your urine turns a dark color or changes to pink, red, or brown. Any of the following   symptoms occur over the next 10 days:  You have a temperature by mouth above 102 F (38.9 C), not controlled by medicine.  Shortness of breath.  Weakness after normal activity.  The white part of the eye turns yellow (jaundice).  You have a decrease in the amount of urine or are urinating less often.  Your  urine turns a dark color or changes to pink, red, or brown. Document Released: 09/28/2000 Document Revised: 12/24/2011 Document Reviewed: 05/17/2008 Exit[employer] Patient Information 2013 [employer].  

## 2013-02-21 LAB — TYPE AND SCREEN
ABO/RH(D): O NEG
Unit division: 0
Unit division: 0

## 2013-03-10 ENCOUNTER — Other Ambulatory Visit: Payer: Self-pay | Admitting: *Deleted

## 2013-03-10 MED ORDER — ATORVASTATIN CALCIUM 80 MG PO TABS: 80.0000 mg | ORAL_TABLET | Freq: Every day | ORAL | Status: DC

## 2013-03-10 MED ORDER — HYDROCHLOROTHIAZIDE 25 MG PO TABS: 25.0000 mg | ORAL_TABLET | Freq: Every day | ORAL | Status: DC

## 2013-03-20 ENCOUNTER — Other Ambulatory Visit (HOSPITAL_BASED_OUTPATIENT_CLINIC_OR_DEPARTMENT_OTHER): Payer: Medicare Other | Admitting: Lab

## 2013-03-20 ENCOUNTER — Ambulatory Visit (HOSPITAL_BASED_OUTPATIENT_CLINIC_OR_DEPARTMENT_OTHER): Payer: Medicare Other | Admitting: Medical

## 2013-03-20 VITALS — BP 93/43 | HR 83 | Temp 97.5°F | Resp 16 | Ht 61.0 in | Wt 129.0 lb

## 2013-03-20 DIAGNOSIS — D4620 Refractory anemia with excess of blasts, unspecified: Secondary | ICD-10-CM

## 2013-03-20 LAB — MANUAL DIFFERENTIAL (CHCC SATELLITE)
ALC: 1 10*3/uL (ref 0.6–2.2)
ANC (CHCC HP manual diff): 1.8 10*3/uL (ref 1.5–6.7)
LYMPH: 32 % (ref 14–48)
MONO: 3 % (ref 0–13)
PLT EST ~~LOC~~: ADEQUATE
SEG: 56 % (ref 40–75)

## 2013-03-20 LAB — CBC WITH DIFFERENTIAL (CANCER CENTER ONLY)
MCH: 35.1 pg — ABNORMAL HIGH (ref 26.0–34.0)
MCHC: 33.3 g/dL (ref 32.0–36.0)
Platelets: 209 10*3/uL (ref 145–400)
RBC: 2.68 10*6/uL — ABNORMAL LOW (ref 3.70–5.32)

## 2013-03-20 LAB — HOLD TUBE, BLOOD BANK - CHCC SATELLITE

## 2013-03-20 NOTE — Progress Notes (Signed)
DIAGNOSIS:  Myelodysplastic syndrome.  CURRENT THERAPY:  Palliative transfusions.  INTERIM HISTORY: Monica Riggs presents today for an office followup visit. .  Overall, she, reports, that she's doing well.  She still continues to remain active.  The last, time, she received a blood transfusion was May 9th 2014.  Her hemoglobin today is 9.4.  She goes every few weeks to her primary care physician for lab work, and they fax it over to [place].  Her blood, has been holding relatively stable since her last transfusion.  She's not complaining of any increase in fatigue, or weakness.  She's not reporting any type of shortness of breath.  She has a good appetite.  She denies any nausea, vomiting, diarrhea, constipation, chest pain, shortness of breath, or cough.  She denies any fevers, chills, or night sweats.  She denies any lower leg swelling.  She denies any obvious, or abnormal bleeding.  She denies any headaches, visual changes, or rashes.  Review of Systems: Constitutional:Negative for malaise/fatigue, fever, chills, weight loss, diaphoresis, activity change, appetite change, and unexpected weight change.  HEENT: Negative for double vision, blurred vision, visual loss, ear pain, tinnitus, congestion, rhinorrhea, epistaxis sore throat or sinus disease, oral pain/lesion, tongue soreness Respiratory: Negative for cough, chest tightness, shortness of breath, wheezing and stridor.  Cardiovascular: Negative for chest pain, palpitations, leg swelling, orthopnea, PND, DOE or claudication Gastrointestinal: Negative for nausea, vomiting, abdominal pain, diarrhea, constipation, blood in stool, melena, hematochezia, abdominal distention, anal bleeding, rectal pain, anorexia and hematemesis.  Genitourinary: Negative for dysuria, frequency, hematuria,  Musculoskeletal: Negative for myalgias, back pain, joint swelling, arthralgias and gait problem.  Skin: Negative for rash, color change, pallor and wound.  Neurological:.  Negative for dizziness/light-headedness, tremors, seizures, syncope, facial asymmetry, speech difficulty, weakness, numbness, headaches and paresthesias.  Hematological: Negative for adenopathy. Does not bruise/bleed easily.  Psychiatric/Behavioral:  Negative for depression, no loss of interest in normal activity or change in sleep pattern.   Physical Exam: This is a pleasant, elderly, 79 year old, white female, in no obvious distress Vitals: Temperature 97.5 degrees pulse 83 respirations 16 blood pressure 90/43 weight 129 pounds HEENT reveals a normocephalic, atraumatic skull, no scleral icterus, no oral lesions  Neck is supple without any cervical or supraclavicular adenopathy.  Lungs are clear to auscultation bilaterally. There are no wheezes, rales or rhonci Cardiac is regular rate and rhythm with a normal S1 and S2. There are no murmurs, rubs, or bruits.  Abdomen is soft with good bowel sounds, there is no palpable mass. There is no palpable hepatosplenomegaly. There is no palpable fluid wave.  Musculoskeletal no tenderness of the spine, ribs, or hips.  Extremities there are no clubbing, cyanosis, or edema.  Skin no petechia, purpura or ecchymosis Neurologic is nonfocal.  Laboratory Data:  White count 3.2 hemoglobin 9.4 hematocrit 28.2 platelets 209,000.  Current Outpatient Prescriptions on File Prior to Visit  Medication Sig Dispense Refill  . aspirin 325 MG EC tablet Take 325 mg by mouth daily.      Marland Kitchen atorvastatin (LIPITOR) 80 MG tablet Take 80 mg by mouth daily. Take 1/2 tablet (40mg ) daily      . brimonidine (ALPHAGAN) 0.2 % ophthalmic solution Place 1 drop into both eyes 2 (two) times daily.      . cholecalciferol (VITAMIN D) 1000 UNITS tablet Take 1,000 Units by mouth daily.      Marland Kitchen co-enzyme Q-10 50 MG capsule Take 50 mg by mouth daily.      . Cyanocobalamin (VITAMIN B  12 PO) Take 1,000 mg by mouth every morning.      . diltiazem (TIAZAC) 300 MG 24 hr capsule Take 300 mg by  mouth daily.      . ferrous sulfate 325 (65 FE) MG tablet Take 325 mg by mouth 2 (two) times daily.      . folic acid (FOLVITE) 400 MCG tablet Take 400 mcg by mouth 2 (two) times daily.      . hydrochlorothiazide (HYDRODIURIL) 25 MG tablet Take 25 mg by mouth daily.      Marland Kitchen levothyroxine (SYNTHROID, LEVOTHROID) 25 MCG tablet Take 25 mcg by mouth daily.      Marland Kitchen olmesartan (BENICAR) 40 MG tablet Take 40 mg by mouth daily. Take 1/2 talblet (20mg ) daily      . omega-3 acid ethyl esters (LOVAZA) 1 G capsule Take 2 g by mouth daily.       No current facility-administered medications on file prior to visit.   Assessment/Plan: This is a pleasant, 79 year old, white female, with the following issues:  #1.  Low-grade myelodysplasia.  Again, secondary to her age, we decided not to do a bone marrow, biopsy on her.  She is agreeable to supportive transfusions only.  She will continue to go to her primary care physician for lab work.  We will continue to monitor her blood counts closely.  #2.  Followup.  We'll follow back up with [name] in 6 weeks, but before then should there be questions or concerns.

## 2013-04-08 ENCOUNTER — Other Ambulatory Visit: Payer: Self-pay | Admitting: *Deleted

## 2013-04-08 MED ORDER — DILTIAZEM HCL ER BEADS 300 MG PO CP24: 300.0000 mg | ORAL_CAPSULE | Freq: Every day | ORAL | Status: DC

## 2013-04-13 ENCOUNTER — Other Ambulatory Visit (INDEPENDENT_AMBULATORY_CARE_PROVIDER_SITE_OTHER): Payer: Medicare Other

## 2013-04-13 DIAGNOSIS — Q068 Other specified congenital malformations of spinal cord: Secondary | ICD-10-CM

## 2013-04-14 ENCOUNTER — Other Ambulatory Visit: Payer: Self-pay

## 2013-04-14 MED ORDER — LEVOTHYROXINE SODIUM 25 MCG PO TABS: 25.0000 ug | ORAL_TABLET | Freq: Every day | ORAL | Status: DC

## 2013-04-22 NOTE — Progress Notes (Signed)
Patient came in for labs only.

## 2013-04-27 ENCOUNTER — Encounter: Payer: Self-pay | Admitting: Family Medicine

## 2013-04-27 ENCOUNTER — Ambulatory Visit (INDEPENDENT_AMBULATORY_CARE_PROVIDER_SITE_OTHER): Payer: Medicare Other | Admitting: Family Medicine

## 2013-04-27 VITALS — BP 122/56 | HR 84 | Temp 96.9°F | Ht 61.5 in | Wt 128.0 lb

## 2013-04-27 DIAGNOSIS — IMO0001 Reserved for inherently not codable concepts without codable children: Secondary | ICD-10-CM

## 2013-04-27 DIAGNOSIS — E559 Vitamin D deficiency, unspecified: Secondary | ICD-10-CM

## 2013-04-27 DIAGNOSIS — E039 Hypothyroidism, unspecified: Secondary | ICD-10-CM

## 2013-04-27 DIAGNOSIS — R5383 Other fatigue: Secondary | ICD-10-CM

## 2013-04-27 DIAGNOSIS — E785 Hyperlipidemia, unspecified: Secondary | ICD-10-CM

## 2013-04-27 DIAGNOSIS — Q068 Other specified congenital malformations of spinal cord: Secondary | ICD-10-CM

## 2013-04-27 NOTE — Addendum Note (Signed)
Addended by: Orma Render F on: 04/27/2013 04:15 PM   Modules accepted: Orders

## 2013-04-27 NOTE — Progress Notes (Signed)
  Subjective:    Patient ID: Monica Riggs, female    DOB: [birth year], 79 y.o.   MRN: [id number]  HPI Patient comes in today for followup of chronic medical problems and management. These problems include hyperlipidemia, hypertension, hypothyroidism, and most prominently myelodysplasia. She sees the hematologist for this. Lab work is being drawn today and a copy of this will be forwarded to her hematologist. Her last hemoglobin in March was 11.1. On June 6 her hemoglobin was 9.4.   Review of Systems  Constitutional: Positive for fatigue.  HENT: Negative.   Eyes: Negative.   Respiratory: Positive for cough (dry).   Cardiovascular: Negative.   Gastrointestinal: Positive for constipation. Negative for abdominal pain.  Endocrine: Negative.   Genitourinary: Negative.   Musculoskeletal: Positive for arthralgias (bilateral hips).  Skin: Negative.   Allergic/Immunologic: Negative.   Neurological: Positive for dizziness. Negative for headaches.  Hematological: Negative.   Psychiatric/Behavioral: Negative.        Objective:   Physical Exam BP 122/56  Pulse 84  Temp(Src) 96.9 F (36.1 C) (Oral)  Ht 5' 1.5" (1.562 m)  Wt 128 lb (58.06 kg)  BMI 23.8 kg/m2  The patient appeared well nourished and normally developed for her age, alert and oriented to time and place. Speech, behavior and judgement appear normal. Vital signs as documented.  Head exam is unremarkable. No scleral icterus or pallor noted.  Neck is without jugular venous distension, thyromegally, or carotid bruits. Carotid upstrokes are brisk bilaterally. No cervical adenopathy. Lungs are clear anteriorly and posteriorly to auscultation. Normal respiratory effort. Cardiac exam reveals regular rate and rhythm at 72/min. First and second heart sounds normal.  No murmurs, rubs or gallops.  Abdominal exam reveals normal bowl sounds, no masses, no organomegaly and no aortic enlargement. No inguinal adenopathy. There was no  tenderness to palpation. Extremities are nonedematous and both femoral and pedal pulses are normal. Skin without pallor or jaundice.  Warm and dry, without rash. Neurologic exam reveals normal deep tendon reflexes and normal sensation.          Assessment & Plan:  1. Myelodysplasia - POCT CBC; Standing  2. Hyperlipemia - Hepatic function panel; Standing - NMR Lipoprofile with Lipids; Standing  3. Hypothyroid - Thyroid Panel With TSH  4. Fatigue - POCT CBC; Standing - BASIC METABOLIC PANEL WITH GFR; Standing  5. Vitamin D deficiency - Vitamin D 25 hydroxy; Standing  Patient Instructions  Fall precautions discussed Continue current meds and therapeutic lifestyle changes

## 2013-04-27 NOTE — Patient Instructions (Addendum)
Fall precautions discussed Continue current meds and therapeutic lifestyle changes 

## 2013-04-27 NOTE — Addendum Note (Signed)
Addended by: Orma Render F on: 04/27/2013 04:16 PM   Modules accepted: Orders

## 2013-04-28 LAB — HEPATIC FUNCTION PANEL
Albumin: 4.1 g/dL (ref 3.5–5.2)
Alkaline Phosphatase: 98 U/L (ref 39–117)
Bilirubin, Direct: 0.2 mg/dL (ref 0.0–0.3)
Indirect Bilirubin: 0.5 mg/dL (ref 0.0–0.9)
Total Bilirubin: 0.7 mg/dL (ref 0.3–1.2)

## 2013-04-28 LAB — BASIC METABOLIC PANEL WITH GFR
CO2: 27 mEq/L (ref 19–32)
Chloride: 101 mEq/L (ref 96–112)
Creat: 1.02 mg/dL (ref 0.50–1.10)
GFR, Est Non African American: 48 mL/min — ABNORMAL LOW
Potassium: 4.2 mEq/L (ref 3.5–5.3)
Sodium: 139 mEq/L (ref 135–145)

## 2013-04-28 LAB — NMR LIPOPROFILE WITH LIPIDS
HDL Particle Number: 30.5 umol/L (ref >=30.5)
HDL Size: 9.6 nm (ref >=9.2)
HDL-C: 44 mg/dL (ref >=40)
LDL (calc): 42 mg/dL (ref <100)
LDL Size: 19.8 nm — ABNORMAL LOW (ref >20.5)
LP-IR Score: 29 (ref <=45)
Triglycerides: 110 mg/dL (ref <150)

## 2013-04-28 LAB — CBC WITH DIFFERENTIAL/PLATELET
Eosinophils Relative: 9 % — ABNORMAL HIGH (ref 0–5)
HCT: 20.9 % — ABNORMAL LOW (ref 36.0–46.0)
Lymphocytes Relative: 17 % (ref 12–46)
Lymphs Abs: 0.6 10*3/uL — ABNORMAL LOW (ref 0.7–4.0)
MCV: 105 fL — ABNORMAL HIGH (ref 78.0–100.0)
Neutro Abs: 2.2 10*3/uL (ref 1.7–7.7)
Platelets: 248 10*3/uL (ref 150–400)
RBC: 1.99 MIL/uL — ABNORMAL LOW (ref 3.87–5.11)
WBC: 3.5 10*3/uL — ABNORMAL LOW (ref 4.0–10.5)

## 2013-04-28 LAB — THYROID PANEL WITH TSH
Free Thyroxine Index: 3.9 (ref 1.0–3.9)
TSH: 3.62 u[IU]/mL (ref 0.350–4.500)

## 2013-05-01 ENCOUNTER — Other Ambulatory Visit: Payer: Medicare Other | Admitting: Lab

## 2013-05-01 ENCOUNTER — Ambulatory Visit: Payer: Medicare Other | Admitting: Hematology & Oncology

## 2013-05-01 ENCOUNTER — Ambulatory Visit (HOSPITAL_BASED_OUTPATIENT_CLINIC_OR_DEPARTMENT_OTHER): Payer: Medicare Other | Admitting: Hematology & Oncology

## 2013-05-01 ENCOUNTER — Ambulatory Visit (HOSPITAL_COMMUNITY)
Admission: RE | Admit: 2013-05-01 | Discharge: 2013-05-01 | Disposition: A | Discharge status: Home / Self Care | Payer: Medicare Other | Source: Ambulatory Visit | Attending: Hematology & Oncology | Admitting: Hematology & Oncology

## 2013-05-01 ENCOUNTER — Other Ambulatory Visit (HOSPITAL_BASED_OUTPATIENT_CLINIC_OR_DEPARTMENT_OTHER): Payer: Medicare Other | Admitting: Lab

## 2013-05-01 VITALS — BP 124/47 | HR 69 | Temp 97.5°F | Resp 16 | Ht 61.0 in | Wt 127.0 lb

## 2013-05-01 DIAGNOSIS — D4620 Refractory anemia with excess of blasts, unspecified: Secondary | ICD-10-CM | POA: Insufficient documentation

## 2013-05-01 DIAGNOSIS — Q068 Other specified congenital malformations of spinal cord: Secondary | ICD-10-CM

## 2013-05-01 LAB — MANUAL DIFFERENTIAL (CHCC SATELLITE)
ALC: 0.8 10*3/uL (ref 0.6–2.2)
ANC (CHCC HP manual diff): 2.4 10*3/uL (ref 1.5–6.7)
BASO: 4 % — ABNORMAL HIGH (ref 0–2)
Band Neutrophils: 2 % (ref 0–10)
LYMPH: 19 % (ref 14–48)
PLT EST ~~LOC~~: ADEQUATE
SEG: 57 % (ref 40–75)

## 2013-05-01 LAB — CBC WITH DIFFERENTIAL (CANCER CENTER ONLY)
HGB: 6.8 g/dL — CL (ref 11.6–15.9)
MCH: 38 pg — ABNORMAL HIGH (ref 26.0–34.0)
Platelets: 249 10*3/uL (ref 145–400)
RBC: 1.79 10*6/uL — ABNORMAL LOW (ref 3.70–5.32)
WBC: 4.1 10*3/uL (ref 3.9–10.0)

## 2013-05-01 NOTE — Progress Notes (Signed)
This office note has been dictated.

## 2013-05-01 NOTE — Addendum Note (Signed)
Addended by: Arlan Organ R on: 05/01/2013 03:15 PM   Modules accepted: Orders, SmartSet

## 2013-05-02 NOTE — Progress Notes (Signed)
CC:   Ernestina Penna, M.D.  DIAGNOSIS:  Refractory anemia.  CURRENT THERAPY:  Transfusions for symptom support.  INTERIM HISTORY:  [name] comes in for followup.  She is feeling a little bit tired.  I think the last time she got transfused was back in May.  She has had no bleeding.  Her appetite is down a little bit.  She has had no fever.  She has had some weight loss.  She has had no cough or shortness of breath.  Overall, her performance status is ECOG 2-3.  PHYSICAL EXAM:  General:  This is an elderly, somewhat thin white female in no obvious distress.  Vital Signs:  Temperature of 97.5, pulse 69, respiratory rate 16, blood pressure 124/47.  Weight is 127.  Head and Neck:  She has a normocephalic, atraumatic skull.  There are no ocular or oral lesions.  There are no palpable cervical or supraclavicular lymph nodes.  Lungs:  Clear bilaterally.  Cardiac:  Regular rate and rhythm with a normal S1 and S2.  There are no murmurs, rubs, or bruits. Abdomen:  Soft with good bowel sounds.  There is no palpable abdominal mass.  There is no palpable hepatosplenomegaly.  Extremities:  Show no clubbing, cyanosis, or edema.  Neurological:  Shows no focal neurological deficits.  LABORATORY STUDIES:  The white cell count is 4.1.  Hemoglobin 6.8, hematocrit 20.5, platelet count 249.  MCV is 115.  Peripheral smear shows no blasts.  She does have some metamyelocytes. There are no nucleated red blood cells.  Platelets are adequate in number and size.  I see no target cells.  IMPRESSION:  Monica Riggs is a 79 year old white female with myelodysplasia.  She has refractory anemia.  For now, we will go ahead and get her set up with another transfusion. The fact that she had been transfused 2-1/2 months ago, to me, is encouraging.  We will go ahead and plan to see her in another couple months ourselves.  She does have blood work done up at [facility].  Her  erythropoietin level when we first saw her was 718, so she would not be a candidate for ESA.    ______________________________ Josph Macho, M.D. PRE/MEDQ  D:  05/01/2013  T:  05/02/2013  Job:  [id number]

## 2013-05-04 ENCOUNTER — Ambulatory Visit (HOSPITAL_BASED_OUTPATIENT_CLINIC_OR_DEPARTMENT_OTHER): Payer: Medicare Other

## 2013-05-04 VITALS — BP 134/64 | HR 70 | Temp 98.1°F | Resp 18

## 2013-05-04 DIAGNOSIS — Q068 Other specified congenital malformations of spinal cord: Secondary | ICD-10-CM

## 2013-05-04 MED ORDER — FUROSEMIDE 10 MG/ML IJ SOLN
10.0000 mg | Freq: Once | INTRAMUSCULAR | Status: AC
  Administered 2013-05-04: 10 mg via INTRAVENOUS

## 2013-05-04 MED ORDER — ACETAMINOPHEN 325 MG PO TABS
650.0000 mg | ORAL_TABLET | Freq: Once | ORAL | Status: AC
  Administered 2013-05-04: 650 mg via ORAL

## 2013-05-04 NOTE — Patient Instructions (Addendum)
Blood Transfusion  A blood transfusion replaces your blood or some of its parts. Blood is replaced when you have lost blood because of surgery, an accident, or for severe blood conditions like anemia. You can donate blood to be used on yourself if you have a planned surgery. If you lose blood during that surgery, your own blood can be given back to you. Any blood given to you is checked to make sure it matches your blood type. Your temperature, blood pressure, and heart rate (vital signs) will be checked often.  GET HELP RIGHT AWAY IF:   You feel sick to your stomach (nauseous) or throw up (vomit).  You have watery poop (diarrhea).  You have shortness of breath or trouble breathing.  You have blood in your pee (urine) or have dark colored pee.  You have chest pain or tightness.  Your eyes or skin turn yellow (jaundice).  You have a temperature by mouth above 102 F (38.9 C), not controlled by medicine.  You start to shake and have chills.  You develop a a red rash (hives) or feel itchy.  You develop lightheadedness or feel confused.  You develop back, joint, or muscle pain.  You do not feel hungry (lost appetite).  You feel tired, restless, or nervous.  You develop belly (abdominal) cramps. Document Released: 12/28/2008 Document Revised: 12/24/2011 Document Reviewed: 12/28/2008 Exit[employer] Patient Information 2014 [employer].  

## 2013-05-05 LAB — TYPE AND SCREEN: Unit division: 0

## 2013-05-07 ENCOUNTER — Encounter: Payer: Self-pay | Admitting: Hematology & Oncology

## 2013-05-12 ENCOUNTER — Telehealth: Payer: Self-pay | Admitting: *Deleted

## 2013-05-12 NOTE — Telephone Encounter (Signed)
Message copied by Bearl Mulberry on Tue May 12, 2013 12:35 PM ------      Message from: Ernestina Penna      Created: Tue Apr 28, 2013  1:38 PM       Thyroid Is within normal limits      Electrolytes were good. Blood sugar slightly increased at 111. Kidney function tests good..      Liver function tests within normal limits      Vitamin D level is good and within normal limits      All cholesterol numbers by mask lipid testing were excellent      The CBC had a white blood cell count that was slightly decreased at 3.5. The hemoglobin was decreased at 7.2. The hematocrit was 20.9. Platelets were adequate.       ------

## 2013-05-12 NOTE — Telephone Encounter (Signed)
Pt notified of results

## 2013-05-14 ENCOUNTER — Other Ambulatory Visit: Payer: Self-pay | Admitting: *Deleted

## 2013-05-14 MED ORDER — OLMESARTAN MEDOXOMIL 40 MG PO TABS: 40.0000 mg | ORAL_TABLET | Freq: Every day | ORAL | Status: DC

## 2013-06-08 ENCOUNTER — Other Ambulatory Visit (INDEPENDENT_AMBULATORY_CARE_PROVIDER_SITE_OTHER): Payer: Medicare Other

## 2013-06-08 DIAGNOSIS — C801 Malignant (primary) neoplasm, unspecified: Secondary | ICD-10-CM

## 2013-06-08 NOTE — Progress Notes (Signed)
Patient came in for labs from dr.ennever hemoglobin recheck

## 2013-06-09 LAB — HEMOGLOBIN: Hemoglobin: 7.8 g/dL — ABNORMAL LOW (ref 11.1–15.9)

## 2013-07-02 ENCOUNTER — Ambulatory Visit (HOSPITAL_COMMUNITY)
Admission: RE | Admit: 2013-07-02 | Discharge: 2013-07-02 | Disposition: A | Discharge status: Home / Self Care | Payer: Medicare Other | Source: Ambulatory Visit | Attending: Hematology & Oncology | Admitting: Hematology & Oncology

## 2013-07-02 ENCOUNTER — Other Ambulatory Visit (HOSPITAL_BASED_OUTPATIENT_CLINIC_OR_DEPARTMENT_OTHER): Payer: Medicare Other | Admitting: Lab

## 2013-07-02 ENCOUNTER — Ambulatory Visit (HOSPITAL_BASED_OUTPATIENT_CLINIC_OR_DEPARTMENT_OTHER): Payer: Medicare Other | Admitting: Hematology & Oncology

## 2013-07-02 VITALS — BP 127/47 | HR 72 | Temp 97.2°F | Resp 14 | Ht 61.0 in | Wt 127.0 lb

## 2013-07-02 DIAGNOSIS — D4620 Refractory anemia with excess of blasts, unspecified: Secondary | ICD-10-CM

## 2013-07-02 DIAGNOSIS — D649 Anemia, unspecified: Secondary | ICD-10-CM | POA: Insufficient documentation

## 2013-07-02 LAB — CBC WITH DIFFERENTIAL (CANCER CENTER ONLY)
HCT: 20 % — ABNORMAL LOW (ref 34.8–46.6)
MCHC: 32.5 g/dL (ref 32.0–36.0)
MCV: 115 fL — ABNORMAL HIGH (ref 81–101)
Platelets: 201 10*3/uL (ref 145–400)
RDW: 21.2 % — ABNORMAL HIGH (ref 11.1–15.7)

## 2013-07-02 LAB — MANUAL DIFFERENTIAL (CHCC SATELLITE)
ALC: 0.6 10*3/uL (ref 0.6–2.2)
ANC (CHCC HP manual diff): 2.4 10*3/uL (ref 1.5–6.7)
Metamyelocytes: 1 % — ABNORMAL HIGH (ref 0–0)
SEG: 61 % (ref 40–75)

## 2013-07-02 LAB — HOLD TUBE, BLOOD BANK - CHCC SATELLITE

## 2013-07-02 NOTE — Progress Notes (Signed)
This office note has been dictated.

## 2013-07-03 ENCOUNTER — Ambulatory Visit (HOSPITAL_BASED_OUTPATIENT_CLINIC_OR_DEPARTMENT_OTHER): Payer: Medicare Other

## 2013-07-03 VITALS — BP 134/63 | HR 69 | Temp 96.9°F | Resp 20

## 2013-07-03 DIAGNOSIS — D4620 Refractory anemia with excess of blasts, unspecified: Secondary | ICD-10-CM

## 2013-07-03 MED ORDER — ACETAMINOPHEN 325 MG PO TABS
650.0000 mg | ORAL_TABLET | Freq: Once | ORAL | Status: AC
  Administered 2013-07-03: 650 mg via ORAL

## 2013-07-03 MED ORDER — SODIUM CHLORIDE 0.9 % IV SOLN
250.0000 mL | Freq: Once | INTRAVENOUS | Status: AC
  Administered 2013-07-03: 250 mL via INTRAVENOUS

## 2013-07-06 LAB — TYPE AND SCREEN: Unit division: 0

## 2013-07-08 ENCOUNTER — Encounter: Payer: Self-pay | Admitting: Hematology & Oncology

## 2013-07-13 ENCOUNTER — Other Ambulatory Visit: Payer: Self-pay

## 2013-07-13 MED ORDER — DILTIAZEM HCL ER BEADS 300 MG PO CP24: 300.0000 mg | ORAL_CAPSULE | Freq: Every day | ORAL | Status: DC

## 2013-07-13 MED ORDER — GLUCOSE BLOOD VI STRP: ORAL_STRIP | Status: DC

## 2013-07-14 NOTE — Progress Notes (Signed)
DIAGNOSIS:  Refractory anemia-low grade.  CURRENT THERAPY:  Transfusions as indicated.  INTERIM HISTORY:  Monica Riggs comes in for her followup.  She is doing okay.  She was last transfused back in July. She is starting to feel more tired.  This usually is an automatic indicator for transfusion.  She is not bleeding.  She has had a poor appetite.  Her weight has not changed however, she says that she forces herself to eat.  There has been no cough.  She has had no leg swelling.  There has been no rashes.  Overall, her performance status is ECOG 2.  PHYSICAL EXAMINATION:  General:  This is an elderly, petite, white female in no obvious distress.  Vital signs:  Temperature of 97.4, pulse 72, respiratory rate 18, blood pressure 127/47.  Weight is 127.  Head and neck:  Normocephalic, atraumatic skull.  There are no ocular or oral lesions.  She has pale conjunctivae.  She has no adenopathy in the neck. Thyroid is nonpalpable.  Lungs:  Clear bilaterally.  There are no rales, wheezes or rhonchi.  Cardiac:  Regular rate and rhythm with a normal S1 and S2.  There are no murmurs, rubs or bruits.  Abdomen:  Soft.  She has good bowel sounds.  There is no fluid wave.  There is no palpable hepatosplenomegaly.  Extremities:  No clubbing, cyanosis or edema. Neurological:  Shows no focal neurological deficits.  LABORATORY STUDIES:  White cell count is 3.3, hemoglobin 6.5, hematocrit 20, platelet count 201.  MCV is 115. On her peripheral smear, which I have reviewed, I do not see any blasts. She had macrocytic red cells.  There were no nucleated red blood cells. Platelets were adequate in number and size.  IMPRESSION:  The patient is a very nice 79 year old white female with refractory anemia.  We are transfusing her as indicated for symptoms. Again, she was last transfused back in July so she has really done quite well.  We transfused her every 2 or 3 months.  We will go ahead and plan to get her  back to see [place] in another couple months.  She has blood work done over at [facility], I think, every couple of weeks or so.  Again, I will plan to see her back myself in 2 months.  Her daughter was with her.  I reviewed the lab work with her daughter.  We will have Monica Riggs come in tomorrow for her transfusion.   ______________________________ Josph Macho, M.D. PRE/MEDQ  D:  07/02/2013  T:  07/14/2013  Job:  [id number]

## 2013-08-19 ENCOUNTER — Ambulatory Visit (HOSPITAL_COMMUNITY)
Admission: RE | Admit: 2013-08-19 | Discharge: 2013-08-19 | Disposition: A | Discharge status: Home / Self Care | Payer: Medicare Other | Source: Ambulatory Visit | Attending: Hematology & Oncology | Admitting: Hematology & Oncology

## 2013-08-19 DIAGNOSIS — D649 Anemia, unspecified: Secondary | ICD-10-CM | POA: Insufficient documentation

## 2013-08-19 DIAGNOSIS — D469 Myelodysplastic syndrome, unspecified: Secondary | ICD-10-CM | POA: Insufficient documentation

## 2013-09-03 ENCOUNTER — Ambulatory Visit (HOSPITAL_BASED_OUTPATIENT_CLINIC_OR_DEPARTMENT_OTHER): Payer: Medicare Other | Admitting: Hematology & Oncology

## 2013-09-03 ENCOUNTER — Other Ambulatory Visit (HOSPITAL_BASED_OUTPATIENT_CLINIC_OR_DEPARTMENT_OTHER): Payer: Medicare Other | Admitting: Lab

## 2013-09-03 VITALS — BP 121/53 | HR 92 | Temp 97.5°F | Resp 14 | Ht 61.0 in | Wt 131.0 lb

## 2013-09-03 DIAGNOSIS — D469 Myelodysplastic syndrome, unspecified: Secondary | ICD-10-CM

## 2013-09-03 DIAGNOSIS — D4620 Refractory anemia with excess of blasts, unspecified: Secondary | ICD-10-CM

## 2013-09-03 DIAGNOSIS — D46Z Other myelodysplastic syndromes: Secondary | ICD-10-CM

## 2013-09-03 LAB — CBC WITH DIFFERENTIAL (CANCER CENTER ONLY)
HCT: 18.8 % — ABNORMAL LOW (ref 34.8–46.6)
HGB: 6 g/dL — CL (ref 11.6–15.9)
MCV: 113 fL — ABNORMAL HIGH (ref 81–101)
RBC: 1.66 10*6/uL — ABNORMAL LOW (ref 3.70–5.32)
WBC: 3.2 10*3/uL — ABNORMAL LOW (ref 3.9–10.0)

## 2013-09-03 LAB — MANUAL DIFFERENTIAL (CHCC SATELLITE)
ANC (CHCC HP manual diff): 2.2 10*3/uL (ref 1.5–6.7)
BASO: 1 % (ref 0–2)
Eos: 5 % (ref 0–7)
MONO: 7 % (ref 0–13)
Myelocytes: 1 % — ABNORMAL HIGH (ref 0–0)
SEG: 64 % (ref 40–75)

## 2013-09-03 LAB — RETICULOCYTES (CHCC)
ABS Retic: 39.8 10*3/uL (ref 19.0–186.0)
RBC.: 1.73 MIL/uL — ABNORMAL LOW (ref 3.87–5.11)
Retic Ct Pct: 2.3 % (ref 0.4–2.3)

## 2013-09-03 NOTE — Progress Notes (Signed)
Hematology and Oncology Follow Up Visit  Monica Riggs [id number] [birth year] 79 y.o. 09/03/2013   Principle Diagnosis:   Myelodysplasia-refractory anemia  Current Therapy:    Supportive transfusions as indicated or symptoms.     Interim History:  [name] is in for followup. We last saw back in September. We transfuse her at that time.  She is doing okay. She does feel a little more weak. She did not want to have her blood work checked by her family Dr. Geronimo Riggs had no bleeding. She's had no fever. There's been no change in bowel or bladder habits. She's had no increased leg swelling. She's had a little bit of a cough which is chronic this time of year.  She does feel that more fatigue. She feels like she is starting to slow down. She's not going to cook for Thanksgiving which is unusual for her.    Medications: Current outpatient prescriptions:aspirin 325 MG EC tablet, Take 325 mg by mouth daily., Disp: , Rfl: ;  atorvastatin (LIPITOR) 80 MG tablet, Take 1 tablet (80 mg total) by mouth daily. Take 1/2 tablet (40mg ) daily, Disp: 30 tablet, Rfl: 5;  brimonidine (ALPHAGAN) 0.2 % ophthalmic solution, Place 1 drop into both eyes 2 (two) times daily., Disp: , Rfl: ;  cholecalciferol (VITAMIN D) 1000 UNITS tablet, Take 1,000 Units by mouth daily., Disp: , Rfl:  co-enzyme Q-10 50 MG capsule, Take 50 mg by mouth daily., Disp: , Rfl: ;  Cyanocobalamin (VITAMIN B 12 PO), Take 1,000 mg by mouth every morning., Disp: , Rfl: ;  diltiazem (TIAZAC) 300 MG 24 hr capsule, Take 1 capsule (300 mg total) by mouth daily., Disp: 30 capsule, Rfl: 3;  ferrous sulfate 325 (65 FE) MG tablet, Take 325 mg by mouth 2 (two) times daily., Disp: , Rfl:  folic acid (FOLVITE) 400 MCG tablet, Take 400 mcg by mouth 2 (two) times daily., Disp: , Rfl: ;  hydrochlorothiazide (HYDRODIURIL) 25 MG tablet, Take 1 tablet (25 mg total) by mouth daily., Disp: 30 tablet, Rfl: 9;  levothyroxine (SYNTHROID, LEVOTHROID) 25 MCG tablet,  Take 1 tablet (25 mcg total) by mouth daily., Disp: 30 tablet, Rfl: 8 olmesartan (BENICAR) 40 MG tablet, Take 1 tablet (40 mg total) by mouth daily. Take 1/2 talblet (20mg ) daily, Disp: 30 tablet, Rfl: 3;  omega-3 acid ethyl esters (LOVAZA) 1 G capsule, Take 2 g by mouth daily., Disp: , Rfl:   Allergies:  Allergies  Allergen Reactions  . Sulfa Antibiotics Rash    Past Medical History, Surgical history, Social history, and Family History were reviewed and updated.  Physical Exam:  height is 5\' 1"  (1.549 m) and weight is 131 lb (59.421 kg). Her oral temperature is 97.5 F (36.4 C). Her blood pressure is 121/53 and her pulse is 92. Her respiration is 14.   She is a lovely petite elderly female in no obvious distress. Vital signs are listed above. Head in exam shows some conjunctival pallor. She has no oral lesions. She has no adenopathy in the neck. Lungs are clear. She has no rales wheezes or rhonchi. Cardiac exam irregular rate and rhythm which is chronic. She is a 1/6 systolic ejection murmur. Abdomen is soft. She has good bowel sounds. There is no fluid wave. There is no hepato-splenomegaly. Extremities shows some trace edema in her legs. She has osteoarthritic changes in her joints. She has muscle atrophy which is age-related. Muscle strength is 4/5. Skin exam no ecchymoses. Neurological exam no focal neurological deficits.  Lab Results  Component Value Date   WBC 3.2* 09/03/2013   HGB 6.0* 09/03/2013   HCT 18.8* 09/03/2013   MCV 113* 09/03/2013   PLT 277 09/03/2013     Chemistry      Component Value Date/Time   NA 139 04/27/2013 1616   K 4.2 04/27/2013 1616   CL 101 04/27/2013 1616   CO2 27 04/27/2013 1616   BUN 18 04/27/2013 1616   CREATININE 1.02 04/27/2013 1616   CREATININE 1.13* 09/19/2012 1429      Component Value Date/Time   CALCIUM 9.7 04/27/2013 1616   ALKPHOS 98 04/27/2013 1616   AST 12 04/27/2013 1616   ALT 10 04/27/2013 1616   BILITOT 0.7 04/27/2013 1616          Impression and Plan: Ms.. Monica Riggs is 79 year old white female with myelodysplasia. We will go ahead and and and transfuse her.  I noted that her white cell count is trending downward. This we will need to watch closely. This could be an indicator form her marrow beginning to transform into something more aggressive.  Mrs. Monica Riggs would not want anything more aggressive to be done at she understands that she has a process that is not curable and that she wants to focus on her quality of life. I totally agree with this.  We will transfuse her tomorrow. I'll plan to see her back in 6 weeks.   Josph Macho, MD 11/20/201412:04 PM

## 2013-09-04 ENCOUNTER — Ambulatory Visit (HOSPITAL_BASED_OUTPATIENT_CLINIC_OR_DEPARTMENT_OTHER): Payer: Medicare Other

## 2013-09-04 VITALS — BP 123/66 | HR 77 | Temp 98.1°F | Resp 20

## 2013-09-04 DIAGNOSIS — D469 Myelodysplastic syndrome, unspecified: Secondary | ICD-10-CM

## 2013-09-04 DIAGNOSIS — Z23 Encounter for immunization: Secondary | ICD-10-CM

## 2013-09-04 DIAGNOSIS — D4620 Refractory anemia with excess of blasts, unspecified: Secondary | ICD-10-CM

## 2013-09-04 MED ORDER — SODIUM CHLORIDE 0.9 % IV SOLN
250.0000 mL | Freq: Once | INTRAVENOUS | Status: AC
  Administered 2013-09-04: 250 mL via INTRAVENOUS

## 2013-09-04 MED ORDER — ACETAMINOPHEN 325 MG PO TABS
ORAL_TABLET | ORAL | Status: AC
  Filled 2013-09-04: qty 2

## 2013-09-04 MED ORDER — FUROSEMIDE 10 MG/ML IJ SOLN
10.0000 mg | Freq: Once | INTRAMUSCULAR | Status: AC
  Administered 2013-09-04: 10 mg via INTRAVENOUS

## 2013-09-04 MED ORDER — ACETAMINOPHEN 325 MG PO TABS
650.0000 mg | ORAL_TABLET | Freq: Once | ORAL | Status: AC
  Administered 2013-09-04: 650 mg via ORAL

## 2013-09-04 MED ORDER — INFLUENZA VAC SPLIT QUAD 0.5 ML IM SUSP
0.5000 mL | Freq: Once | INTRAMUSCULAR | Status: AC
  Administered 2013-09-04: 0.5 mL via INTRAMUSCULAR
  Filled 2013-09-04: qty 0.5

## 2013-09-04 MED ORDER — FUROSEMIDE 10 MG/ML IJ SOLN
INTRAMUSCULAR | Status: AC
  Filled 2013-09-04: qty 4

## 2013-09-04 NOTE — Patient Instructions (Signed)
Blood Transfusion  A blood transfusion replaces your blood or some of its parts. Blood is replaced when you have lost blood because of surgery, an accident, or for severe blood conditions like anemia. You can donate blood to be used on yourself if you have a planned surgery. If you lose blood during that surgery, your own blood can be given back to you. Any blood given to you is checked to make sure it matches your blood type. Your temperature, blood pressure, and heart rate (vital signs) will be checked often.  GET HELP RIGHT AWAY IF:   You feel sick to your stomach (nauseous) or throw up (vomit).  You have watery poop (diarrhea).  You have shortness of breath or trouble breathing.  You have blood in your pee (urine) or have dark colored pee.  You have chest pain or tightness.  Your eyes or skin turn yellow (jaundice).  You have a temperature by mouth above 102 F (38.9 C), not controlled by medicine.  You start to shake and have chills.  You develop a a red rash (hives) or feel itchy.  You develop lightheadedness or feel confused.  You develop back, joint, or muscle pain.  You do not feel hungry (lost appetite).  You feel tired, restless, or nervous.  You develop belly (abdominal) cramps. Document Released: 12/28/2008 Document Revised: 12/24/2011 Document Reviewed: 12/28/2008 Exit[employer] Patient Information 2014 [employer].  

## 2013-09-06 LAB — TYPE AND SCREEN
Antibody Screen: NEGATIVE
Unit division: 0

## 2013-09-07 ENCOUNTER — Encounter: Payer: Self-pay | Admitting: Hematology & Oncology

## 2013-09-07 ENCOUNTER — Encounter: Payer: Self-pay | Admitting: *Deleted

## 2013-09-18 ENCOUNTER — Ambulatory Visit (HOSPITAL_COMMUNITY)
Admission: RE | Admit: 2013-09-18 | Discharge: 2013-09-18 | Disposition: A | Discharge status: Home / Self Care | Payer: Medicare Other | Source: Ambulatory Visit | Attending: Hematology & Oncology | Admitting: Hematology & Oncology

## 2013-09-18 DIAGNOSIS — D4620 Refractory anemia with excess of blasts, unspecified: Secondary | ICD-10-CM | POA: Insufficient documentation

## 2013-09-18 DIAGNOSIS — I4891 Unspecified atrial fibrillation: Secondary | ICD-10-CM | POA: Insufficient documentation

## 2013-09-21 NOTE — Progress Notes (Signed)
CC:   Monica Riggs, M.D.  DIAGNOSIS:  Refractory anemia - low grade.  CURRENT THERAPY:  Transfusions as indicated.  INTERIM HISTORY:  Monica Riggs comes in for followup.  She is doing okay. She was last transfused back in July.  She started to feel more tired.  This usually is an automatic indicator for transfusion.  She is not bleeding.  She has had poor appetite.  Her weight has not changed, however.  She says that she forces herself to eat.  There has been no cough.  She has had no leg swelling.  There have been no rashes.  Overall, her performance status is ECOG 2.  PHYSICAL EXAMINATION:  General:  This is an elderly, petite, white female, in no obvious distress.  Vital Signs:  Temperature of 97.4, pulse 72, respiratory rate 18, blood pressure 127/47.  Weight is 127. Head and Neck:  Normocephalic, atraumatic skull.  There are no ocular or oral lesions.  She has pale conjunctivae.  She has no adenopathy in the neck.  Thyroid is nonpalpable.  Lungs:  Clear bilaterally.  There are no rales, wheezes, or rhonchi.  Cardiac:  Regular rate and rhythm with a normal S1 and S2.  There are no murmurs, rubs, or bruits.  Abdomen: Soft.  She has good bowel sounds.  There is no fluid wave.  There is no palpable hepatosplenomegaly.  Extremities:  No clubbing, cyanosis, or edema.  Neurological:  No focal neurological deficits.  LABORATORY STUDIES:  White cell count is 3.3, hemoglobin 6.5, hematocrit 20, platelet count 201.  MCV is 115.  On peripheral smear, which I reviewed, I do not see any blasts.  She had macrocytic red cells.  There were no nucleated red blood cells. Platelets were adequate in number and size.  IMPRESSION:  Monica Riggs is a very nice 79 year old white female with refractory anemia.  We are transfusing her as indicated for symptoms. Again, she was last transfused back in July.  She has really done quite well.  We typically transfuse her every 2-3 months.  We will go  ahead and plan to get her back to see [place] in another couple of months.  She has blood work done at [facility], I think every couple of weeks or so.  Again, I will plan to see back her myself in 2 months.  ADDENDUM:  Her daughter was with her.  I reviewed her lab work with her daughter.  We will have Monica Riggs come in tomorrow for her transfusion.    ______________________________ Josph Macho, M.D. PRE/MEDQ  D:  07/02/2013  T:  09/20/2013  Job:  [id number]

## 2013-10-01 ENCOUNTER — Ambulatory Visit (INDEPENDENT_AMBULATORY_CARE_PROVIDER_SITE_OTHER): Payer: Medicare Other | Admitting: Family Medicine

## 2013-10-01 ENCOUNTER — Ambulatory Visit (INDEPENDENT_AMBULATORY_CARE_PROVIDER_SITE_OTHER): Payer: Medicare Other

## 2013-10-01 ENCOUNTER — Encounter: Payer: Self-pay | Admitting: Family Medicine

## 2013-10-01 VITALS — BP 120/63 | HR 117 | Temp 96.5°F | Ht 61.0 in | Wt 129.0 lb

## 2013-10-01 DIAGNOSIS — D649 Anemia, unspecified: Secondary | ICD-10-CM

## 2013-10-01 DIAGNOSIS — E039 Hypothyroidism, unspecified: Secondary | ICD-10-CM

## 2013-10-01 DIAGNOSIS — R5383 Other fatigue: Secondary | ICD-10-CM

## 2013-10-01 DIAGNOSIS — IMO0001 Reserved for inherently not codable concepts without codable children: Secondary | ICD-10-CM

## 2013-10-01 DIAGNOSIS — I499 Cardiac arrhythmia, unspecified: Secondary | ICD-10-CM

## 2013-10-01 DIAGNOSIS — I4891 Unspecified atrial fibrillation: Secondary | ICD-10-CM

## 2013-10-01 DIAGNOSIS — Q068 Other specified congenital malformations of spinal cord: Secondary | ICD-10-CM

## 2013-10-01 DIAGNOSIS — E559 Vitamin D deficiency, unspecified: Secondary | ICD-10-CM

## 2013-10-01 DIAGNOSIS — Z23 Encounter for immunization: Secondary | ICD-10-CM

## 2013-10-01 DIAGNOSIS — E785 Hyperlipidemia, unspecified: Secondary | ICD-10-CM

## 2013-10-01 DIAGNOSIS — R5381 Other malaise: Secondary | ICD-10-CM

## 2013-10-01 NOTE — Patient Instructions (Addendum)
Continue current medications. Continue good therapeutic lifestyle changes which include good diet and exercise. Fall precautions discussed with patient. Always be careful Schedule your flu vaccine if you haven't had it yet If you are over 77 years old - you may need Prevnar 13 or the adult Pneumonia vaccine. Continue your regular followup with the hematologist We made the scheduling you to see the cardiologist because of the cardiac arrhythmia that was detected today

## 2013-10-01 NOTE — Progress Notes (Signed)
Subjective:    Patient ID: Monica Riggs, female    DOB: February 08, 1919, 79 y.o.   MRN: [id number]  HPI Pt here for follow up and management of chronic medical problems. Patient is doing well for her age. She is active physically and handles her activities of daily living and a very positive way. She still sees the hematologist for her myelodysplasia. She does complain of some shortness of breath with activity but no more than usual. She denies any paroxysmal nocturnal dyspnea. She denies chest pain. Calls of her myelodysplasia she sees the hematologist and gets CBCs fairly regularly. He indicates that her anemia secondary to myelodysplasia is getting worse and that she may have to see him more often. She gets transfusions periodically and this has been about every 3 months. She is hearing impaired and wears bilateral hearing aids. Her granddaughter is very close to her and monitors her well being on a daily basis. She is in the office by herself today.      Patient Active Problem List   Diagnosis Date Noted  . Hyperlipemia 04/27/2013  . Hypothyroid 04/27/2013  . Fatigue 04/27/2013  . Vitamin D deficiency 04/27/2013  . Myelodysplasia 01/12/2013   Outpatient Encounter Prescriptions as of 10/01/2013  Medication Sig  . aspirin 325 MG EC tablet Take 325 mg by mouth daily.  Marland Kitchen atorvastatin (LIPITOR) 80 MG tablet Take 1 tablet (80 mg total) by mouth daily. Take 1/2 tablet (40mg ) daily  . brimonidine (ALPHAGAN) 0.2 % ophthalmic solution Place 1 drop into both eyes 2 (two) times daily.  . cholecalciferol (VITAMIN D) 1000 UNITS tablet Take 1,000 Units by mouth daily.  Marland Kitchen co-enzyme Q-10 50 MG capsule Take 50 mg by mouth daily.  . Cyanocobalamin (VITAMIN B 12 PO) Take 1,000 mg by mouth every morning.  . diltiazem (TIAZAC) 300 MG 24 hr capsule Take 1 capsule (300 mg total) by mouth daily.  . ferrous sulfate 325 (65 FE) MG tablet Take 325 mg by mouth 2 (two) times daily.  . folic acid (FOLVITE) 400 MCG  tablet Take 400 mcg by mouth 2 (two) times daily.  . hydrochlorothiazide (HYDRODIURIL) 25 MG tablet Take 1 tablet (25 mg total) by mouth daily.  Marland Kitchen levothyroxine (SYNTHROID, LEVOTHROID) 25 MCG tablet Take 1 tablet (25 mcg total) by mouth daily.  Marland Kitchen olmesartan (BENICAR) 40 MG tablet Take 1 tablet (40 mg total) by mouth daily. Take 1/2 talblet (20mg ) daily  . omega-3 acid ethyl esters (LOVAZA) 1 G capsule Take 2 g by mouth daily.    Review of Systems  Constitutional: Negative.   HENT: Negative.   Eyes: Negative.   Respiratory: Negative.   Cardiovascular: Negative.   Gastrointestinal: Negative.   Endocrine: Negative.   Genitourinary: Negative.   Musculoskeletal: Negative.   Skin: Negative.        Skin change since last blood transfusion. Very dry skin- upper body  Allergic/Immunologic: Negative.   Neurological: Negative.   Hematological: Negative.   Psychiatric/Behavioral: Negative.        Objective:   Physical Exam  Nursing note and vitals reviewed. Constitutional: She is oriented to person, place, and time. She appears well-developed and well-nourished. No distress.  Somewhat pale in color but this is normal for her appearance. She is pleasant smiling and cooperative  HENT:  Head: Normocephalic and atraumatic.  Right Ear: External ear normal.  Left Ear: External ear normal.  Nose: Nose normal.  Mouth/Throat: Oropharynx is clear and moist.  Bilateral hearing aids are in  place and she does have some cerumen in the left ear canal  Eyes: Conjunctivae and EOM are normal. Pupils are equal, round, and reactive to light. Right eye exhibits no discharge. Left eye exhibits no discharge. No scleral icterus.  Neck: Normal range of motion. Neck supple. No thyromegaly present.  Cardiovascular: Normal rate, normal heart sounds and intact distal pulses.  Exam reveals no gallop and no friction rub.   No murmur heard. Irregular irregular rhythm at 84 per minute  Pulmonary/Chest: Effort normal  and breath sounds normal. No respiratory distress. She has no wheezes. She has no rales. She exhibits no tenderness.  Abdominal: Soft. Bowel sounds are normal. She exhibits no mass. There is no tenderness. There is no rebound and no guarding.  No inguinal nodes  Genitourinary:  A breast check today revealed no masses or lumps and no axillary adenopathy on either side.  Musculoskeletal: Normal range of motion. She exhibits no edema and no tenderness.  Lymphadenopathy:    She has no cervical adenopathy.  Neurological: She is alert and oriented to person, place, and time. She has normal reflexes. No cranial nerve deficit.  Skin: Skin is warm and dry.  Skin pallor  Psychiatric: She has a normal mood and affect. Her behavior is normal. Judgment and thought content normal.   BP 120/63  Pulse 117  Temp(Src) 96.5 F (35.8 C) (Oral)  Ht 5\' 1"  (1.549 m)  Wt 129 lb (58.514 kg)  BMI 24.39 kg/m2  EKG: Atrial fibrillation at 89 per minute       Assessment & Plan:  1. Hyperlipemia - POCT CBC - BMP8+EGFR - Hepatic function panel - NMR, lipoprofile  2. Hypothyroid - POCT CBC - Thyroid Panel With TSH  3. Vitamin D deficiency - POCT CBC - Vit D  25 hydroxy (rtn osteoporosis monitoring)  4. Fatigue  5. Myelodysplasia -Followed by hematologist  6. Cardiac arrhythmia -EKG today with possible referral to cardiologist  7. Anemia -This is secondary to her myelodysplasia - Continue to followup with the hematologist  Orders Placed This Encounter  Procedures  . BMP8+EGFR  . Hepatic function panel  . NMR, lipoprofile  . Vit D  25 hydroxy (rtn osteoporosis monitoring)  . Thyroid Panel With TSH  . POCT CBC   No orders of the defined types were placed in this encounter.   Patient Instructions  Continue current medications. Continue good therapeutic lifestyle changes which include good diet and exercise. Fall precautions discussed with patient. Always be careful Schedule your flu  vaccine if you haven't had it yet If you are over 50 years old - you may need Prevnar 13 or the adult Pneumonia vaccine. Continue your regular followup with the hematologist We made the scheduling you to see the cardiologist because of the cardiac arrhythmia that was detected today   Nyra Capes MD

## 2013-10-01 NOTE — Addendum Note (Signed)
Addended by: Magdalene River on: 10/01/2013 11:35 AM   Modules accepted: Orders

## 2013-10-02 LAB — CBC WITH DIFFERENTIAL
Basophils Absolute: 0.1 10*3/uL (ref 0.0–0.2)
Basos: 3 %
Eos: 4 %
HCT: 22.5 % — ABNORMAL LOW (ref 34.0–46.6)
Hemoglobin: 7.8 g/dL — CL (ref 11.1–15.9)
Lymphocytes Absolute: 0.6 10*3/uL — ABNORMAL LOW (ref 0.7–3.1)
MCH: 33.5 pg — ABNORMAL HIGH (ref 26.6–33.0)
MCV: 97 fL (ref 79–97)
Monocytes Absolute: 0.2 10*3/uL (ref 0.1–0.9)
Monocytes: 8 %
Neutrophils Absolute: 1.7 10*3/uL (ref 1.4–7.0)
Platelets: 282 10*3/uL (ref 150–379)
RBC: 2.33 x10E6/uL — CL (ref 3.77–5.28)
RDW: 25.9 % — ABNORMAL HIGH (ref 12.3–15.4)
WBC: 2.7 10*3/uL — ABNORMAL LOW (ref 3.4–10.8)

## 2013-10-03 ENCOUNTER — Other Ambulatory Visit: Payer: Self-pay | Admitting: Family Medicine

## 2013-10-03 DIAGNOSIS — E039 Hypothyroidism, unspecified: Secondary | ICD-10-CM

## 2013-10-03 LAB — HEPATIC FUNCTION PANEL
ALT: 20 IU/L (ref 0–32)
Alkaline Phosphatase: 96 IU/L (ref 39–117)
Bilirubin, Direct: 0.21 mg/dL (ref 0.00–0.40)
Total Bilirubin: 0.5 mg/dL (ref 0.0–1.2)

## 2013-10-03 LAB — BMP8+EGFR
CO2: 21 mmol/L (ref 18–29)
Calcium: 9.5 mg/dL (ref 8.6–10.2)
Chloride: 96 mmol/L — ABNORMAL LOW (ref 97–108)
Creatinine, Ser: 1.04 mg/dL — ABNORMAL HIGH (ref 0.57–1.00)
GFR calc Af Amer: 54 mL/min/{1.73_m2} — ABNORMAL LOW (ref >59)
Glucose: 120 mg/dL — ABNORMAL HIGH (ref 65–99)
Potassium: 4 mmol/L (ref 3.5–5.2)

## 2013-10-03 LAB — NMR, LIPOPROFILE
Cholesterol: 101 mg/dL (ref <200)
HDL Particle Number: 33.8 umol/L (ref >=30.5)
LDL Size: 20.9 nm (ref >20.5)
LP-IR Score: <25 (ref <=45)
Small LDL Particle Number: <90 nmol/L (ref <=527)
Triglycerides by NMR: 79 mg/dL (ref <150)

## 2013-10-03 LAB — THYROID PANEL WITH TSH
T4, Total: 9 ug/dL (ref 4.5–12.0)
TSH: 6.12 u[IU]/mL — ABNORMAL HIGH (ref 0.450–4.500)

## 2013-10-03 LAB — VITAMIN D 25 HYDROXY (VIT D DEFICIENCY, FRACTURES): Vit D, 25-Hydroxy: 78.3 ng/mL (ref 30.0–100.0)

## 2013-10-03 MED ORDER — LEVOTHYROXINE SODIUM 25 MCG PO TABS: ORAL_TABLET | ORAL | Status: DC

## 2013-10-06 MED ORDER — LEVOTHYROXINE SODIUM 50 MCG PO TABS: 50.0000 ug | ORAL_TABLET | Freq: Every day | ORAL | Status: DC

## 2013-10-12 ENCOUNTER — Other Ambulatory Visit: Payer: Self-pay

## 2013-10-12 MED ORDER — ATORVASTATIN CALCIUM 80 MG PO TABS: 80.0000 mg | ORAL_TABLET | Freq: Every day | ORAL | Status: DC

## 2013-10-14 ENCOUNTER — Other Ambulatory Visit (HOSPITAL_BASED_OUTPATIENT_CLINIC_OR_DEPARTMENT_OTHER): Payer: Medicare Other | Admitting: Lab

## 2013-10-14 ENCOUNTER — Ambulatory Visit (HOSPITAL_BASED_OUTPATIENT_CLINIC_OR_DEPARTMENT_OTHER): Payer: Medicare Other | Admitting: Hematology & Oncology

## 2013-10-14 VITALS — BP 111/48 | HR 94 | Temp 97.4°F | Resp 14 | Ht 61.0 in | Wt 133.0 lb

## 2013-10-14 DIAGNOSIS — D4620 Refractory anemia with excess of blasts, unspecified: Secondary | ICD-10-CM

## 2013-10-14 LAB — IRON AND TIBC CHCC
%SAT: 87 % — ABNORMAL HIGH (ref 21–57)
TIBC: 184 ug/dL — ABNORMAL LOW (ref 236–444)
UIBC: 24 ug/dL — ABNORMAL LOW (ref 120–384)

## 2013-10-14 LAB — CHCC SATELLITE - SMEAR

## 2013-10-14 LAB — CBC WITH DIFFERENTIAL (CANCER CENTER ONLY)
HCT: 20.7 % — ABNORMAL LOW (ref 34.8–46.6)
HGB: 6.6 g/dL — CL (ref 11.6–15.9)
MCH: 34.2 pg — ABNORMAL HIGH (ref 26.0–34.0)
MCV: 107 fL — ABNORMAL HIGH (ref 81–101)
Platelets: 240 10*3/uL (ref 145–400)

## 2013-10-14 LAB — MANUAL DIFFERENTIAL (CHCC SATELLITE)
ANC (CHCC HP manual diff): 2.3 10*3/uL (ref 1.5–6.7)
BASO: 4 % — ABNORMAL HIGH (ref 0–2)
Eos: 5 % (ref 0–7)
LYMPH: 19 % (ref 14–48)
MONO: 2 % (ref 0–13)
Metamyelocytes: 1 % — ABNORMAL HIGH (ref 0–0)
PLT EST ~~LOC~~: ADEQUATE

## 2013-10-14 LAB — HOLD TUBE, BLOOD BANK - CHCC SATELLITE

## 2013-10-14 LAB — RETICULOCYTES (CHCC): Retic Ct Pct: 1.6 % (ref 0.4–2.3)

## 2013-10-14 NOTE — Progress Notes (Signed)
This office note has been dictated.

## 2013-10-15 NOTE — Progress Notes (Signed)
CC:   [name], M.D.  DIAGNOSIS:  Refractory anemia.  CURRENT THERAPY:  Transfusions as necessary.  INTERIM HISTORY:  [name] comes in for a followup.  She is doing fairly well.  She does feel more short of breath.  She does have atrial fibrillation.  She is followed by Cardiology for this.  She has had no bleeding.  She has had no nausea or vomiting.  There has been no rashes.  She has had no change in bowel or bladder habits.  PHYSICAL EXAMINATION:  General:  This is an elderly white female in no obvious distress.  Vital signs:  Shows temperature of 97.4, pulse 94, respiratory rate 14, blood pressure 111/48.  Weight is 133 pounds.  Head and Neck:  Normocephalic, atraumatic skull.  There are no ocular or oral lesions.  There are no palpable cervical, or supraclavicular lymph nodes.  Lungs:  Clear bilaterally.  Cardiac:  Regular rate and a regular rhythm consistent with atrial fibrillation.  She has a 1/6 systolic ejection murmur.  Abdomen:  Soft.  She has good bowel sounds.  There is no fluid wave.  There is no palpable abdominal mass.  There is no palpable hepatosplenomegaly.  Extremities:  Shows some trace edema in her legs.  This is chronic.  Skin:  No rashes, ecchymoses, or petechia.  LABORATORY STUDIES:  White cell count is at 3.3, hemoglobin 6.6, hematocrit 20.7, and platelet count is 240.  IMPRESSION:  [name] is a very charming 80 year old white female with myelodysplasia.  She has refractory anemia.  She responds well to the transfusion.  We will go ahead and transfuse her in a couple of days.  This I think will be helpful for her.  She does have the atrial fibrillation.  Cardiology is just being very conservative with this which I totally agree with.  We will go ahead and plan to get her back to see [place] in another 4 weeks or so.    ______________________________ Volanda Napoleon, M.D. PRE/MEDQ  D:  10/14/2013  T:  10/15/2013  Job:  [id number]

## 2013-10-16 ENCOUNTER — Ambulatory Visit (HOSPITAL_COMMUNITY)
Admission: RE | Admit: 2013-10-16 | Discharge: 2013-10-16 | Disposition: A | Discharge status: Home / Self Care | Payer: Medicare Other | Source: Ambulatory Visit | Attending: Hematology & Oncology | Admitting: Hematology & Oncology

## 2013-10-16 ENCOUNTER — Ambulatory Visit (HOSPITAL_BASED_OUTPATIENT_CLINIC_OR_DEPARTMENT_OTHER): Payer: Medicare Other

## 2013-10-16 VITALS — BP 120/68 | HR 77 | Temp 96.6°F | Resp 20

## 2013-10-16 DIAGNOSIS — D649 Anemia, unspecified: Secondary | ICD-10-CM | POA: Insufficient documentation

## 2013-10-16 DIAGNOSIS — D4620 Refractory anemia with excess of blasts, unspecified: Secondary | ICD-10-CM | POA: Insufficient documentation

## 2013-10-16 LAB — PREPARE RBC (CROSSMATCH)

## 2013-10-16 MED ORDER — SODIUM CHLORIDE 0.9 % IV SOLN
250.0000 mL | Freq: Once | INTRAVENOUS | Status: AC
  Administered 2013-10-16: 250 mL via INTRAVENOUS

## 2013-10-16 MED ORDER — FUROSEMIDE 10 MG/ML IJ SOLN
INTRAMUSCULAR | Status: AC
  Filled 2013-10-16: qty 4

## 2013-10-16 MED ORDER — FUROSEMIDE 10 MG/ML IJ SOLN
10.0000 mg | Freq: Once | INTRAMUSCULAR | Status: AC
  Administered 2013-10-16: 10 mg via INTRAVENOUS

## 2013-10-16 MED ORDER — ACETAMINOPHEN 325 MG PO TABS
ORAL_TABLET | ORAL | Status: AC
  Filled 2013-10-16: qty 2

## 2013-10-16 MED ORDER — ACETAMINOPHEN 325 MG PO TABS
650.0000 mg | ORAL_TABLET | Freq: Once | ORAL | Status: AC
  Administered 2013-10-16: 650 mg via ORAL

## 2013-10-16 NOTE — Patient Instructions (Signed)
Blood Transfusion  A blood transfusion replaces your blood or some of its parts. Blood is replaced when you have lost blood because of surgery, an accident, or for severe blood conditions like anemia. You can donate blood to be used on yourself if you have a planned surgery. If you lose blood during that surgery, your own blood can be given back to you. Any blood given to you is checked to make sure it matches your blood type. Your temperature, blood pressure, and heart rate (vital signs) will be checked often.  GET HELP RIGHT AWAY IF:   You feel sick to your stomach (nauseous) or throw up (vomit).  You have watery poop (diarrhea).  You have shortness of breath or trouble breathing.  You have blood in your pee (urine) or have dark colored pee.  You have chest pain or tightness.  Your eyes or skin turn yellow (jaundice).  You have a temperature by mouth above 102 F (38.9 C), not controlled by medicine.  You start to shake and have chills.  You develop a a red rash (hives) or feel itchy.  You develop lightheadedness or feel confused.  You develop back, joint, or muscle pain.  You do not feel hungry (lost appetite).  You feel tired, restless, or nervous.  You develop belly (abdominal) cramps. Document Released: 12/28/2008 Document Revised: 12/24/2011 Document Reviewed: 12/28/2008 Exit[employer] Patient Information 2014 [employer].  

## 2013-10-17 LAB — TYPE AND SCREEN
ABO/RH(D): O NEG
Antibody Screen: NEGATIVE
Unit division: 0
Unit division: 0

## 2013-10-19 ENCOUNTER — Encounter: Payer: Self-pay | Admitting: Hematology & Oncology

## 2013-10-21 ENCOUNTER — Other Ambulatory Visit: Payer: Medicare Other

## 2013-10-21 ENCOUNTER — Ambulatory Visit: Payer: Medicare Other | Admitting: Cardiology

## 2013-10-21 NOTE — Progress Notes (Signed)
Patient dropped off fobt 

## 2013-10-22 LAB — FECAL OCCULT BLOOD, IMMUNOCHEMICAL: Fecal Occult Bld: NEGATIVE

## 2013-11-11 ENCOUNTER — Other Ambulatory Visit: Payer: Self-pay | Admitting: *Deleted

## 2013-11-11 ENCOUNTER — Ambulatory Visit (HOSPITAL_BASED_OUTPATIENT_CLINIC_OR_DEPARTMENT_OTHER): Payer: Medicare Other | Admitting: Lab

## 2013-11-11 ENCOUNTER — Ambulatory Visit (HOSPITAL_BASED_OUTPATIENT_CLINIC_OR_DEPARTMENT_OTHER): Payer: Medicare Other | Admitting: Hematology & Oncology

## 2013-11-11 DIAGNOSIS — D469 Myelodysplastic syndrome, unspecified: Principal | ICD-10-CM

## 2013-11-11 DIAGNOSIS — D4620 Refractory anemia with excess of blasts, unspecified: Secondary | ICD-10-CM

## 2013-11-11 DIAGNOSIS — IMO0001 Reserved for inherently not codable concepts without codable children: Secondary | ICD-10-CM

## 2013-11-11 LAB — MANUAL DIFFERENTIAL (CHCC SATELLITE)
ALC: 0.7 10*3/uL (ref 0.6–2.2)
ANC (CHCC HP manual diff): 1.3 10*3/uL — ABNORMAL LOW (ref 1.5–6.7)
BAND NEUTROPHILS: 3 % (ref 0–10)
BASO: 2 % (ref 0–2)
EOS: 7 % (ref 0–7)
LYMPH: 29 % (ref 14–48)
MONO: 3 % (ref 0–13)
Other Cells: 3 % — ABNORMAL HIGH (ref 0–0)
PLT EST ~~LOC~~: ADEQUATE
SEG: 53 % (ref 40–75)

## 2013-11-11 LAB — CBC WITH DIFFERENTIAL (CANCER CENTER ONLY)
HEMATOCRIT: 24.7 % — AB (ref 34.8–46.6)
HGB: 8.3 g/dL — ABNORMAL LOW (ref 11.6–15.9)
MCH: 34 pg (ref 26.0–34.0)
MCHC: 33.6 g/dL (ref 32.0–36.0)
MCV: 101 fL (ref 81–101)
Platelets: 265 10*3/uL (ref 145–400)
RBC: 2.44 10*6/uL — ABNORMAL LOW (ref 3.70–5.32)
RDW: 24.1 % — ABNORMAL HIGH (ref 11.1–15.7)
WBC: 2.3 10*3/uL — ABNORMAL LOW (ref 3.9–10.0)

## 2013-11-11 LAB — CHCC SATELLITE - SMEAR

## 2013-11-11 LAB — HOLD TUBE, BLOOD BANK - CHCC SATELLITE

## 2013-11-11 MED ORDER — OLMESARTAN MEDOXOMIL 40 MG PO TABS: 40.0000 mg | ORAL_TABLET | Freq: Every day | ORAL | Status: DC

## 2013-11-11 MED ORDER — DILTIAZEM HCL ER BEADS 300 MG PO CP24: 300.0000 mg | ORAL_CAPSULE | Freq: Every day | ORAL | Status: DC

## 2013-11-11 NOTE — Progress Notes (Signed)
This office note has been dictated.

## 2013-11-12 NOTE — Progress Notes (Signed)
CC:   [name], M.D.  DIAGNOSIS:  Refractory anemia - low-grade myelodysplasia.  CURRENT THERAPY:  Transfusion support.  INTERIM HISTORY:  Monica Riggs comes in for followup.  She is doing quite well.  She was transfused on October 16, 2013.  This transfusion seemed to have helped her out quite a bit.  She has had no nausea or vomiting.  She has had no significant fatigue. There has been no cough or shortness of breath.  There has been no leg swelling.  There has been no change in bowel or bladder habits.  Overall, her performance status is ECOG 2.  PHYSICAL EXAMINATION:  General:  This is an elderly white female, in no obvious distress.  Vital Signs:  Temperature of 97.5, pulse 87, respiratory rate 14, blood pressure 127/61, weight is 126 pounds.  Head and Neck:  Normocephalic, atraumatic skull.  There are no ocular or oral lesions.  There are no palpable cervical or supraclavicular lymph nodes. Lungs:  Clear bilaterally.  Cardiac:  Regular rate and rhythm with a normal S1, S2.  There are no murmurs, rubs, or bruits.  Abdomen:  Soft. She has good bowel sounds.  There is no fluid wave.  There is no palpable abdominal mass.  There is no palpable hepatosplenomegaly. Extremities:  No clubbing, cyanosis, or edema.  Neurologic:  No focal neurological deficits.  LABORATORY STUDIES:  White cell count is 2.3, hemoglobin 8.3, hematocrit 24.7, platelet count 265.  IMPRESSION:  Monica Riggs is a very charming 80 year old white female. She has refractory anemia.  We are transfusing her for symptoms.  She has done very well with this.  She has had transfusions every 4 to 6 weeks.  We will go ahead and plan to get her back in another 4 to 5 weeks.  This would be a good time for followup.  I would suspect that by then, she probably would need transfusion.  I am just glad that her quality of life is made well and made better with transfusion  support.    ______________________________ Volanda Napoleon, M.D. PRE/MEDQ  D:  11/11/2013  T:  11/12/2013  Job:  [id number]

## 2013-11-23 ENCOUNTER — Ambulatory Visit (HOSPITAL_COMMUNITY)
Admission: RE | Admit: 2013-11-23 | Discharge: 2013-11-23 | Disposition: A | Discharge status: Home / Self Care | Payer: Medicare Other | Source: Ambulatory Visit | Attending: Hematology & Oncology | Admitting: Hematology & Oncology

## 2013-12-16 ENCOUNTER — Other Ambulatory Visit (HOSPITAL_BASED_OUTPATIENT_CLINIC_OR_DEPARTMENT_OTHER): Payer: Medicare Other | Admitting: Lab

## 2013-12-16 ENCOUNTER — Encounter: Payer: Self-pay | Admitting: Hematology & Oncology

## 2013-12-16 ENCOUNTER — Ambulatory Visit (HOSPITAL_BASED_OUTPATIENT_CLINIC_OR_DEPARTMENT_OTHER): Payer: Medicare Other | Admitting: Hematology & Oncology

## 2013-12-16 ENCOUNTER — Ambulatory Visit (HOSPITAL_COMMUNITY)
Admission: RE | Admit: 2013-12-16 | Discharge: 2013-12-16 | Disposition: A | Discharge status: Home / Self Care | Payer: Medicare Other | Source: Ambulatory Visit | Attending: Hematology & Oncology | Admitting: Hematology & Oncology

## 2013-12-16 VITALS — BP 114/59 | HR 110 | Temp 97.6°F | Resp 14 | Ht 63.0 in | Wt 133.0 lb

## 2013-12-16 DIAGNOSIS — D4620 Refractory anemia with excess of blasts, unspecified: Secondary | ICD-10-CM

## 2013-12-16 DIAGNOSIS — D649 Anemia, unspecified: Secondary | ICD-10-CM | POA: Insufficient documentation

## 2013-12-16 DIAGNOSIS — IMO0001 Reserved for inherently not codable concepts without codable children: Secondary | ICD-10-CM

## 2013-12-16 DIAGNOSIS — D469 Myelodysplastic syndrome, unspecified: Secondary | ICD-10-CM | POA: Insufficient documentation

## 2013-12-16 LAB — CBC WITH DIFFERENTIAL (CANCER CENTER ONLY)
HCT: 17.6 % — ABNORMAL LOW (ref 34.8–46.6)
HGB: 5.8 g/dL — CL (ref 11.6–15.9)
MCH: 36 pg — ABNORMAL HIGH (ref 26.0–34.0)
MCHC: 33 g/dL (ref 32.0–36.0)
MCV: 109 fL — AB (ref 81–101)
Platelets: 274 10*3/uL (ref 145–400)
RBC: 1.61 10*6/uL — ABNORMAL LOW (ref 3.70–5.32)
WBC: 3.6 10*3/uL — AB (ref 3.9–10.0)

## 2013-12-16 LAB — MANUAL DIFFERENTIAL (CHCC SATELLITE)
ALC: 1 10*3/uL (ref 0.6–2.2)
ANC (CHCC HP manual diff): 2.3 10*3/uL (ref 1.5–6.7)
Band Neutrophils: 2 % (ref 0–10)
Eos: 4 % (ref 0–7)
LYMPH: 27 % (ref 14–48)
MONO: 7 % (ref 0–13)
PLT EST ~~LOC~~: ADEQUATE
SEG: 60 % (ref 40–75)

## 2013-12-16 LAB — CHCC SATELLITE - SMEAR

## 2013-12-16 LAB — HOLD TUBE, BLOOD BANK - CHCC SATELLITE

## 2013-12-16 LAB — RETICULOCYTES (CHCC)
ABS RETIC: 29.9 10*3/uL (ref 19.0–186.0)
RBC.: 1.66 MIL/uL — ABNORMAL LOW (ref 3.87–5.11)
RETIC CT PCT: 1.8 % (ref 0.4–2.3)

## 2013-12-16 LAB — PREPARE RBC (CROSSMATCH)

## 2013-12-16 NOTE — Progress Notes (Signed)
  DIAGNOSIS:  Refractory anemia-low-grade myelodysplasia   CURRENT THERAPY:  Transfusion support as indicated   INTERIM HISTORY:  Mr. Wickstrom comes in for a followup. She is more tired. She is more short of breath. She's having more swelling in the legs. This is always an indicator that her hemoglobin is down.    His been 2 months since she was transfused. As such, she is ready for another transfusion.   Traumatized doing okay. She's had no nausea vomiting. She's had no bleeding. PHYSICAL EXAMINATION:  Elderly white female in no obvious distress. Vital signs are temperature of 97 6. Pulse 110. Blood pressure 140/59. Weight is 133 pounds. Head and neck exam shows no ocular or oral lesions. She has pale conjunctiva. There is no adenopathy in the neck. Lungs are clear. Cardiac exam irregular rate and rhythm. She is tachycardic. This is atrial fibrillation. Abdomen soft. There is no fluid wave. There is no liver or spleen tip. Extremities shows 1-2+ edema.   LABORATORY STUDIES: White cell count is 2.6. Hemoglobin 5.8. Hematocrit 17.6. Platelet count 274. MCV is 109. Blood smear does not show any immature myeloid cells. There is no teardrop cells. There is no nucleated red cells. Platelets are okay.  IMPRESSION:  Ms. Jerline Pain is a 80 year old white female. She has myelodysplasia. We transfuse her every 6-8 weeks.    We will go ahead and transfuse her.   We do have to be careful with respect to iron issues. In December, her ferritin was 744. Saturation was 87%.  I'll plan to go back to see [place] in another month.  Volanda Napoleon, MD 12/16/2013

## 2013-12-17 ENCOUNTER — Ambulatory Visit (HOSPITAL_BASED_OUTPATIENT_CLINIC_OR_DEPARTMENT_OTHER): Payer: Medicare Other

## 2013-12-17 ENCOUNTER — Encounter: Payer: Self-pay | Admitting: Hematology & Oncology

## 2013-12-17 VITALS — BP 132/68 | HR 92 | Temp 96.8°F | Resp 20

## 2013-12-17 DIAGNOSIS — D4620 Refractory anemia with excess of blasts, unspecified: Secondary | ICD-10-CM

## 2013-12-17 MED ORDER — ACETAMINOPHEN 325 MG PO TABS
ORAL_TABLET | ORAL | Status: AC
  Filled 2013-12-17: qty 2

## 2013-12-17 MED ORDER — SODIUM CHLORIDE 0.9 % IV SOLN
250.0000 mL | Freq: Once | INTRAVENOUS | Status: AC
  Administered 2013-12-17: 250 mL via INTRAVENOUS

## 2013-12-17 MED ORDER — ACETAMINOPHEN 325 MG PO TABS
650.0000 mg | ORAL_TABLET | Freq: Once | ORAL | Status: AC
  Administered 2013-12-17: 650 mg via ORAL

## 2013-12-17 MED ORDER — FUROSEMIDE 10 MG/ML IJ SOLN: 20.0000 mg | Freq: Once | INTRAMUSCULAR | Status: DC

## 2013-12-17 NOTE — Patient Instructions (Signed)
Blood Transfusion  A blood transfusion replaces your blood or some of its parts. Blood is replaced when you have lost blood because of surgery, an accident, or for severe blood conditions like anemia. You can donate blood to be used on yourself if you have a planned surgery. If you lose blood during that surgery, your own blood can be given back to you. Any blood given to you is checked to make sure it matches your blood type. Your temperature, blood pressure, and heart rate (vital signs) will be checked often.  GET HELP RIGHT AWAY IF:   You feel sick to your stomach (nauseous) or throw up (vomit).  You have watery poop (diarrhea).  You have shortness of breath or trouble breathing.  You have blood in your pee (urine) or have dark colored pee.  You have chest pain or tightness.  Your eyes or skin turn yellow (jaundice).  You have a temperature by mouth above 102 F (38.9 C), not controlled by medicine.  You start to shake and have chills.  You develop a a red rash (hives) or feel itchy.  You develop lightheadedness or feel confused.  You develop back, joint, or muscle pain.  You do not feel hungry (lost appetite).  You feel tired, restless, or nervous.  You develop belly (abdominal) cramps. Document Released: 12/28/2008 Document Revised: 12/24/2011 Document Reviewed: 12/28/2008 Exit[employer] Patient Information 2014 [employer].  

## 2013-12-18 LAB — TYPE AND SCREEN
ABO/RH(D): O NEG
ANTIBODY SCREEN: NEGATIVE
Unit division: 0
Unit division: 0

## 2014-01-04 ENCOUNTER — Encounter: Payer: Self-pay | Admitting: Family Medicine

## 2014-01-04 ENCOUNTER — Ambulatory Visit (INDEPENDENT_AMBULATORY_CARE_PROVIDER_SITE_OTHER): Payer: Medicare Other | Admitting: Family Medicine

## 2014-01-04 VITALS — BP 124/65 | HR 81 | Temp 96.5°F | Ht 63.0 in | Wt 123.0 lb

## 2014-01-04 DIAGNOSIS — E039 Hypothyroidism, unspecified: Secondary | ICD-10-CM

## 2014-01-04 DIAGNOSIS — E559 Vitamin D deficiency, unspecified: Secondary | ICD-10-CM

## 2014-01-04 DIAGNOSIS — E785 Hyperlipidemia, unspecified: Secondary | ICD-10-CM

## 2014-01-04 DIAGNOSIS — Z8679 Personal history of other diseases of the circulatory system: Secondary | ICD-10-CM

## 2014-01-04 DIAGNOSIS — D649 Anemia, unspecified: Secondary | ICD-10-CM

## 2014-01-04 NOTE — Patient Instructions (Addendum)
Medicare Annual Wellness Visit  [facility] and the medical providers at [facility] strive to bring you the best medical care.  In doing so we not only want to address your current medical conditions and concerns but also to detect new conditions early and prevent illness, disease and health-related problems.    Medicare offers a yearly Wellness Visit which allows our clinical staff to assess your need for preventative services including immunizations, lifestyle education, counseling to decrease risk of preventable diseases and screening for fall risk and other medical concerns.    This visit is provided free of charge (no copay) for all Medicare recipients. The clinical pharmacists at [facility] have begun to conduct these Wellness Visits which will also include a thorough review of all your medications.    As you primary medical provider recommend that you make an appointment for your Annual Wellness Visit if you have not done so already this year.  You may set up this appointment before you leave today or you may call back ([phone]) and schedule an appointment.  Please make sure when you call that you mention that you are scheduling your Annual Wellness Visit with the clinical pharmacist so that the appointment may be made for the proper length of time.      Continue current medications. Continue good therapeutic lifestyle changes which include good diet and exercise. Fall precautions discussed with patient. If an FOBT was given today- please return it to our front desk. If you are over 62 years old - you may need Prevnar 76 or the adult Pneumonia vaccine.  Please discuss the Lifeline with your granddaughter Take Tylenol as needed for arthralgias

## 2014-01-04 NOTE — Progress Notes (Signed)
Subjective:    Patient ID: Monica Riggs, female    DOB: [birth year], 80 y.o.   MRN: [id number]  HPI Pt here for follow up and management of chronic medical problems. The patient does have a fairly profound anemia secondary to myelodysplasia and she is being followed by the hematologist regularly for this. She does complain of some edema and arthralgias. The patient's biggest complaint is her shortness of breath with activity. Because she still lives at home, we will recommend that she get a Lifeline. She is seen by the hematologist once or twice a month and he usually does not give her a transfusion unless her hemoglobin gets as low as 6. His goal for her according to the patient is between 8 and 9. She has had lab work done today.        Patient Active Problem List   Diagnosis Date Noted  . Hyperlipemia 04/27/2013  . Hypothyroid 04/27/2013  . Fatigue 04/27/2013  . Vitamin D deficiency 04/27/2013  . Myelodysplasia 01/12/2013   Outpatient Encounter Prescriptions as of 01/04/2014  Medication Sig  . aspirin 325 MG EC tablet Take 325 mg by mouth daily.  Marland Kitchen atorvastatin (LIPITOR) 80 MG tablet Take 1 tablet (80 mg total) by mouth daily. Take 1/2 tablet (20m) daily  . brimonidine (ALPHAGAN) 0.2 % ophthalmic solution Place 1 drop into both eyes 2 (two) times daily.  . cholecalciferol (VITAMIN D) 1000 UNITS tablet Take 1,000 Units by mouth daily.  .Marland Kitchenco-enzyme Q-10 50 MG capsule Take 50 mg by mouth daily.  . Cyanocobalamin (VITAMIN B 12 PO) Take 1,000 mg by mouth every morning.  . diltiazem (TIAZAC) 300 MG 24 hr capsule Take 1 capsule (300 mg total) by mouth daily.  . folic acid (FOLVITE) 4793MCG tablet Take 400 mcg by mouth 2 (two) times daily.  . hydrochlorothiazide (HYDRODIURIL) 25 MG tablet Take 1 tablet (25 mg total) by mouth daily.  .Marland Kitchenlevothyroxine (SYNTHROID) 25 MCG tablet Take 25 mcg by mouth daily before breakfast.  . olmesartan (BENICAR) 40 MG tablet Take 1 tablet (40 mg total) by  mouth daily. Take 1/2 talblet (273m daily  . omega-3 acid ethyl esters (LOVAZA) 1 G capsule Take 2 g by mouth daily.    Review of Systems  Constitutional: Negative.   HENT: Negative.   Eyes: Negative.   Respiratory: Negative.   Cardiovascular: Positive for leg swelling.  Gastrointestinal: Negative.   Endocrine: Negative.   Genitourinary: Negative.   Musculoskeletal: Positive for arthralgias (bilateral leg pain).  Skin: Negative.   Allergic/Immunologic: Negative.   Neurological: Negative.   Hematological: Negative.   Psychiatric/Behavioral: Negative.        Objective:   Physical Exam  Nursing note and vitals reviewed. Constitutional: She is oriented to person, place, and time. She appears well-developed and well-nourished. No distress.  Pale, but alert and pleasant and other than her pallor and looks very good for her age of [age]HENT:  Head: Normocephalic and atraumatic.  Right Ear: External ear normal.  Left Ear: External ear normal.  Nose: Nose normal.  Mouth/Throat: Oropharynx is clear and moist.  Eyes: Conjunctivae and EOM are normal. Pupils are equal, round, and reactive to light. Right eye exhibits no discharge. Left eye exhibits no discharge. No scleral icterus.  Neck: Normal range of motion. Neck supple. No JVD present. No thyromegaly present.  Cardiovascular: Normal rate, normal heart sounds and intact distal pulses.  Exam reveals no gallop and no friction rub.   No  murmur heard. Irregular heart beat at 84 per minute  Pulmonary/Chest: Effort normal and breath sounds normal. No respiratory distress. She has no wheezes. She has no rales. She exhibits no tenderness.  Abdominal: Soft. Bowel sounds are normal. She exhibits no mass. There is no tenderness. There is no rebound and no guarding.  Musculoskeletal: Normal range of motion. She exhibits no edema and no tenderness.  Good joint movement  Lymphadenopathy:    She has no cervical adenopathy.  Neurological: She is  alert and oriented to person, place, and time. She has normal reflexes. No cranial nerve deficit.  Skin: Skin is warm and dry. There is pallor.  Psychiatric: She has a normal mood and affect. Her behavior is normal. Judgment and thought content normal.   BP 124/65  Pulse 81  Temp(Src) 96.5 F (35.8 C) (Oral)  Ht _0  (1.6 m)  Wt 123 lb (55.792 kg)  BMI 21.79 kg/m2        Assessment & Plan:  1. Hyperlipemia - BMP8+EGFR - Hepatic function panel - Lipid panel - CBC With differential/Platelet  2. Hypothyroid - CBC With differential/Platelet  3. Vitamin D deficiency - Vit D  25 hydroxy (rtn osteoporosis monitoring) - CBC With differential/Platelet  4. Anemia  5. History of irregular heartbeat -We will continue to monitor this  Patient Instructions                       Medicare Annual Wellness Visit  [facility] and the medical providers at [facility] strive to bring you the best medical care.  In doing so we not only want to address your current medical conditions and concerns but also to detect new conditions early and prevent illness, disease and health-related problems.    Medicare offers a yearly Wellness Visit which allows our clinical staff to assess your need for preventative services including immunizations, lifestyle education, counseling to decrease risk of preventable diseases and screening for fall risk and other medical concerns.    This visit is provided free of charge (no copay) for all Medicare recipients. The clinical pharmacists at [facility] have begun to conduct these Wellness Visits which will also include a thorough review of all your medications.    As you primary medical provider recommend that you make an appointment for your Annual Wellness Visit if you have not done so already this year.  You may set up this appointment before you leave today or you may call back [phone]) and schedule an appointment.   Please make sure when you call that you mention that you are scheduling your Annual Wellness Visit with the clinical pharmacist so that the appointment may be made for the proper length of time.      Continue current medications. Continue good therapeutic lifestyle changes which include good diet and exercise. Fall precautions discussed with patient. If an FOBT was given today- please return it to our front desk. If you are over 51 years old - you may need Prevnar 61 or the adult Pneumonia vaccine.  Please discuss the Lifeline with your granddaughter Take Tylenol as needed for arthralgias   Arrie Senate MD

## 2014-01-05 LAB — CBC WITH DIFFERENTIAL
Basophils Absolute: 0.1 10*3/uL (ref 0.0–0.2)
Basos: 3 %
EOS ABS: 0.3 10*3/uL (ref 0.0–0.4)
EOS: 10 %
HCT: 23.1 % — ABNORMAL LOW (ref 34.0–46.6)
Hemoglobin: 7.9 g/dL — CL (ref 11.1–15.9)
IMMATURE GRANULOCYTES: 1 %
Immature Grans (Abs): 0 10*3/uL (ref 0.0–0.1)
LYMPHS ABS: 0.7 10*3/uL (ref 0.7–3.1)
Lymphs: 23 %
MCH: 33.1 pg — AB (ref 26.6–33.0)
MCHC: 34.2 g/dL (ref 31.5–35.7)
MCV: 97 fL (ref 79–97)
MONOS ABS: 0.3 10*3/uL (ref 0.1–0.9)
Monocytes: 9 %
NEUTROS PCT: 54 %
Neutrophils Absolute: 1.7 10*3/uL (ref 1.4–7.0)
Platelets: 280 10*3/uL (ref 150–379)
RBC: 2.39 x10E6/uL — CL (ref 3.77–5.28)
RDW: 25.3 % — AB (ref 12.3–15.4)
WBC: 3.1 10*3/uL — ABNORMAL LOW (ref 3.4–10.8)

## 2014-01-05 LAB — BMP8+EGFR
BUN/Creatinine Ratio: 14 (ref 11–26)
BUN: 14 mg/dL (ref 10–36)
CALCIUM: 9.8 mg/dL (ref 8.7–10.3)
CHLORIDE: 98 mmol/L (ref 97–108)
CO2: 21 mmol/L (ref 18–29)
Creatinine, Ser: 1 mg/dL (ref 0.57–1.00)
GFR calc non Af Amer: 49 mL/min/{1.73_m2} — ABNORMAL LOW (ref >59)
GFR, EST AFRICAN AMERICAN: 57 mL/min/{1.73_m2} — AB (ref >59)
GLUCOSE: 119 mg/dL — AB (ref 65–99)
POTASSIUM: 4 mmol/L (ref 3.5–5.2)
Sodium: 136 mmol/L (ref 134–144)

## 2014-01-05 LAB — HEPATIC FUNCTION PANEL
ALBUMIN: 3.8 g/dL (ref 3.2–4.6)
ALT: 17 IU/L (ref 0–32)
AST: 16 IU/L (ref 0–40)
Alkaline Phosphatase: 95 IU/L (ref 39–117)
Bilirubin, Direct: 0.31 mg/dL (ref 0.00–0.40)
TOTAL PROTEIN: 5.9 g/dL — AB (ref 6.0–8.5)
Total Bilirubin: 0.8 mg/dL (ref 0.0–1.2)

## 2014-01-05 LAB — LIPID PANEL
CHOL/HDL RATIO: 1.7 ratio (ref 0.0–4.4)
Cholesterol, Total: 94 mg/dL — ABNORMAL LOW (ref 100–199)
HDL: 55 mg/dL (ref >39)
LDL CALC: 25 mg/dL (ref 0–99)
Triglycerides: 68 mg/dL (ref 0–149)
VLDL Cholesterol Cal: 14 mg/dL (ref 5–40)

## 2014-01-05 LAB — VITAMIN D 25 HYDROXY (VIT D DEFICIENCY, FRACTURES): Vit D, 25-Hydroxy: 58.9 ng/mL (ref 30.0–100.0)

## 2014-01-13 ENCOUNTER — Other Ambulatory Visit (HOSPITAL_BASED_OUTPATIENT_CLINIC_OR_DEPARTMENT_OTHER): Payer: Medicare Other | Admitting: Lab

## 2014-01-13 ENCOUNTER — Ambulatory Visit (HOSPITAL_COMMUNITY)
Admission: RE | Admit: 2014-01-13 | Discharge: 2014-01-13 | Disposition: A | Discharge status: Home / Self Care | Payer: Medicare Other | Source: Ambulatory Visit | Attending: Hematology & Oncology | Admitting: Hematology & Oncology

## 2014-01-13 ENCOUNTER — Ambulatory Visit (HOSPITAL_BASED_OUTPATIENT_CLINIC_OR_DEPARTMENT_OTHER): Payer: Medicare Other | Admitting: Hematology & Oncology

## 2014-01-13 DIAGNOSIS — D4620 Refractory anemia with excess of blasts, unspecified: Secondary | ICD-10-CM

## 2014-01-13 DIAGNOSIS — D46Z Other myelodysplastic syndromes: Secondary | ICD-10-CM

## 2014-01-13 DIAGNOSIS — D649 Anemia, unspecified: Secondary | ICD-10-CM | POA: Insufficient documentation

## 2014-01-13 LAB — CBC WITH DIFFERENTIAL (CANCER CENTER ONLY)
HCT: 20.3 % — ABNORMAL LOW (ref 34.8–46.6)
HGB: 6.8 g/dL — CL (ref 11.6–15.9)
MCH: 35.1 pg — ABNORMAL HIGH (ref 26.0–34.0)
MCHC: 33.5 g/dL (ref 32.0–36.0)
MCV: 105 fL — AB (ref 81–101)
Platelets: 292 10*3/uL (ref 145–400)
RBC: 1.94 10*6/uL — AB (ref 3.70–5.32)
RDW: 25 % — ABNORMAL HIGH (ref 11.1–15.7)
WBC: 3.9 10*3/uL (ref 3.9–10.0)

## 2014-01-13 LAB — MANUAL DIFFERENTIAL (CHCC SATELLITE)
ALC: 0.7 10*3/uL (ref 0.6–2.2)
ANC (CHCC HP manual diff): 2.5 10*3/uL (ref 1.5–6.7)
BASO: 1 % (ref 0–2)
Band Neutrophils: 5 % (ref 0–10)
Eos: 11 % — ABNORMAL HIGH (ref 0–7)
LYMPH: 17 % (ref 14–48)
MONO: 3 % (ref 0–13)
Other Cells: 3 % — ABNORMAL HIGH (ref 0–0)
PLT EST ~~LOC~~: ADEQUATE
SEG: 60 % (ref 40–75)

## 2014-01-13 LAB — RETICULOCYTES (CHCC)
ABS Retic: 32.2 10*3/uL (ref 19.0–186.0)
RBC.: 2.01 MIL/uL — ABNORMAL LOW (ref 3.87–5.11)
Retic Ct Pct: 1.6 % (ref 0.4–2.3)

## 2014-01-13 LAB — CHCC SATELLITE - SMEAR

## 2014-01-13 LAB — HOLD TUBE, BLOOD BANK - CHCC SATELLITE

## 2014-01-13 LAB — PREPARE RBC (CROSSMATCH)

## 2014-01-13 NOTE — Progress Notes (Signed)
Hematology and Oncology Follow Up Visit  RHILEY TARVER [id number] Oct 11, 1919 80 y.o. 01/13/2014   Principle Diagnosis:   Refractory anemia - high-grade myelodysplasia  Current Therapy:    Supportive care with blood transfusions as indicated     Interim History:  Ms.  Schlag is in for followup. She looks pale. She feels tired. She does not have much of an appetite. She is not hurting. She's having no shortness of breath.  We last transfused her back on March 5. Typically, she can go 2 or 3 months without being transfused.  She's had no bleeding. She's had no bruising. Prevacid been no change in bowel or bladder habits. She has had leg swelling. This is chronic for her.  She's had no headache. There's been no issues with bony pain.  Overall, her performance status is ECOG 3  Medications: Current outpatient prescriptions:aspirin 325 MG EC tablet, Take 325 mg by mouth daily., Disp: , Rfl: ;  atorvastatin (LIPITOR) 80 MG tablet, Take 1 tablet (80 mg total) by mouth daily. Take 1/2 tablet (40mg ) daily, Disp: 30 tablet, Rfl: 5;  brimonidine (ALPHAGAN) 0.2 % ophthalmic solution, Place 1 drop into both eyes 2 (two) times daily., Disp: , Rfl: ;  cholecalciferol (VITAMIN D) 1000 UNITS tablet, Take 1,000 Units by mouth daily., Disp: , Rfl:  co-enzyme Q-10 50 MG capsule, Take 50 mg by mouth daily., Disp: , Rfl: ;  Cyanocobalamin (VITAMIN B 12 PO), Take 1,000 mg by mouth every morning., Disp: , Rfl: ;  diltiazem (TIAZAC) 300 MG 24 hr capsule, Take 1 capsule (300 mg total) by mouth daily., Disp: 30 capsule, Rfl: 4;  folic acid (FOLVITE) 664 MCG tablet, Take 400 mcg by mouth 2 (two) times daily., Disp: , Rfl:  hydrochlorothiazide (HYDRODIURIL) 25 MG tablet, Take 1 tablet (25 mg total) by mouth daily., Disp: 30 tablet, Rfl: 9;  levothyroxine (SYNTHROID) 25 MCG tablet, Take 25 mcg by mouth daily before breakfast., Disp: , Rfl: ;  olmesartan (BENICAR) 40 MG tablet, Take 1 tablet (40 mg total) by mouth daily.  Take 1/2 talblet (20mg ) daily, Disp: 30 tablet, Rfl: 4;  omega-3 acid ethyl esters (LOVAZA) 1 G capsule, Take 2 g by mouth daily., Disp: , Rfl:   Allergies:  Allergies  Allergen Reactions  . Sulfa Antibiotics Rash    Past Medical History, Surgical history, Social history, and Family History were reviewed and updated.  Review of Systems: As above  Physical Exam:  vitals were not taken for this visit.  Elderly, petite white female. Lungs are clear. Cardiac exam tachycardic with irregular rhythm. This is consistent with atrial fibrillation. Ocular exam shows pale conjunctiva. There are no oral lesions. Abdomen is soft. She has good bowel sounds. There is no palpable liver or spleen tip. Back exam shows a slight kyphosis. There is no tenderness over the spine. Extremities shows trace 1+ edema in her lower leg. She is a related osteoarthritic changes in her joints. Muscle strength is 4/5 bilaterally. Skin exam shows no ecchymosis or petechia. Neurological exam is nonfocal.  Lab Results  Component Value Date   WBC 3.9 01/13/2014   HGB 6.8* 01/13/2014   HCT 20.3* 01/13/2014   MCV 105* 01/13/2014   PLT 292 01/13/2014     Chemistry      Component Value Date/Time   NA 136 01/04/2014 1047   NA 139 04/27/2013 1616   K 4.0 01/04/2014 1047   CL 98 01/04/2014 1047   CO2 21 01/04/2014 1047   BUN  14 01/04/2014 1047   BUN 18 04/27/2013 1616   CREATININE 1.00 01/04/2014 1047   CREATININE 1.02 04/27/2013 1616      Component Value Date/Time   CALCIUM 9.8 01/04/2014 1047   ALKPHOS 95 01/04/2014 1047   AST 16 01/04/2014 1047   ALT 17 01/04/2014 1047   BILITOT 0.8 01/04/2014 1047       Other data blood smear. She does have some oral immature myeloid cells. There may be a blasts. Platelets are adequate. Red blood cells had normal morphology. There are no nucleated red cells.  Impression and Plan: Ms. Yeaman is a 80 year old white female. She is myelodysplasia. Unfortunately, I favor going to have to transfuse her  again. This is quite early for her.  By her blood smear, I am somewhat concerned about possibility of transformation to leukemia or least a more high-grade myelodysplasia.  We will go ahead and transfuse her tomorrow. I think this would be wise to do this before the Easter holiday. I want her feeling good for Easter.  I'll plan to get her back to see [place] in another month.  Something is different about her. I just worry that she is beginning to decline. Her lack of appetite is concerning.   Volanda Napoleon, MD 4/1/201511:54 AM

## 2014-01-14 ENCOUNTER — Encounter: Payer: Self-pay | Admitting: Hematology & Oncology

## 2014-01-14 ENCOUNTER — Ambulatory Visit (HOSPITAL_BASED_OUTPATIENT_CLINIC_OR_DEPARTMENT_OTHER): Payer: Medicare Other

## 2014-01-14 VITALS — BP 125/72 | HR 80 | Temp 97.0°F | Resp 20

## 2014-01-14 DIAGNOSIS — D46Z Other myelodysplastic syndromes: Secondary | ICD-10-CM

## 2014-01-14 LAB — IRON AND TIBC CHCC
%SAT: UNDETERMINED % (ref 21–?)
Iron: 177 ug/dL — ABNORMAL HIGH (ref 41–142)
TIBC: 173 ug/dL — ABNORMAL LOW (ref 236–444)
UIBC: UNDETERMINED ug/dL (ref 120–384)

## 2014-01-14 LAB — FERRITIN CHCC

## 2014-01-14 LAB — PREPARE RBC (CROSSMATCH)

## 2014-01-14 MED ORDER — FUROSEMIDE 10 MG/ML IJ SOLN
INTRAMUSCULAR | Status: AC
  Filled 2014-01-14: qty 4

## 2014-01-14 MED ORDER — FUROSEMIDE 10 MG/ML IJ SOLN
10.0000 mg | Freq: Once | INTRAMUSCULAR | Status: AC
  Administered 2014-01-14: 10 mg via INTRAVENOUS

## 2014-01-14 MED ORDER — ACETAMINOPHEN 325 MG PO TABS
650.0000 mg | ORAL_TABLET | Freq: Once | ORAL | Status: AC
  Administered 2014-01-14: 650 mg via ORAL

## 2014-01-14 MED ORDER — ACETAMINOPHEN 325 MG PO TABS
ORAL_TABLET | ORAL | Status: AC
  Filled 2014-01-14: qty 2

## 2014-01-14 MED ORDER — SODIUM CHLORIDE 0.9 % IV SOLN
250.0000 mL | Freq: Once | INTRAVENOUS | Status: AC
  Administered 2014-01-14: 250 mL via INTRAVENOUS

## 2014-01-14 NOTE — Patient Instructions (Signed)
Blood Transfusion  A blood transfusion replaces your blood or some of its parts. Blood is replaced when you have lost blood because of surgery, an accident, or for severe blood conditions like anemia. You can donate blood to be used on yourself if you have a planned surgery. If you lose blood during that surgery, your own blood can be given back to you. Any blood given to you is checked to make sure it matches your blood type. Your temperature, blood pressure, and heart rate (vital signs) will be checked often.  GET HELP RIGHT AWAY IF:   You feel sick to your stomach (nauseous) or throw up (vomit).  You have watery poop (diarrhea).  You have shortness of breath or trouble breathing.  You have blood in your pee (urine) or have dark colored pee.  You have chest pain or tightness.  Your eyes or skin turn yellow (jaundice).  You have a temperature by mouth above 102 F (38.9 C), not controlled by medicine.  You start to shake and have chills.  You develop a a red rash (hives) or feel itchy.  You develop lightheadedness or feel confused.  You develop back, joint, or muscle pain.  You do not feel hungry (lost appetite).  You feel tired, restless, or nervous.  You develop belly (abdominal) cramps. Document Released: 12/28/2008 Document Revised: 12/24/2011 Document Reviewed: 12/28/2008 Exit[employer] Patient Information 2014 [employer].  

## 2014-01-15 LAB — TYPE AND SCREEN
ABO/RH(D): O NEG
ANTIBODY SCREEN: NEGATIVE
UNIT DIVISION: 0
Unit division: 0

## 2014-02-09 ENCOUNTER — Other Ambulatory Visit (HOSPITAL_BASED_OUTPATIENT_CLINIC_OR_DEPARTMENT_OTHER): Payer: Medicare Other | Admitting: Lab

## 2014-02-09 ENCOUNTER — Ambulatory Visit (HOSPITAL_BASED_OUTPATIENT_CLINIC_OR_DEPARTMENT_OTHER): Payer: Medicare Other | Admitting: Hematology & Oncology

## 2014-02-09 ENCOUNTER — Encounter: Payer: Self-pay | Admitting: Hematology & Oncology

## 2014-02-09 VITALS — BP 115/48 | HR 79 | Temp 97.4°F | Resp 14 | Ht 63.0 in | Wt 123.0 lb

## 2014-02-09 DIAGNOSIS — D4620 Refractory anemia with excess of blasts, unspecified: Secondary | ICD-10-CM

## 2014-02-09 DIAGNOSIS — D469 Myelodysplastic syndrome, unspecified: Principal | ICD-10-CM

## 2014-02-09 DIAGNOSIS — D46Z Other myelodysplastic syndromes: Secondary | ICD-10-CM

## 2014-02-09 DIAGNOSIS — IMO0001 Reserved for inherently not codable concepts without codable children: Secondary | ICD-10-CM

## 2014-02-09 LAB — PREALBUMIN: Prealbumin: 17.3 mg/dL (ref 17.0–34.0)

## 2014-02-09 LAB — CBC WITH DIFFERENTIAL (CANCER CENTER ONLY)
HEMATOCRIT: 21 % — AB (ref 34.8–46.6)
HGB: 7.1 g/dL — ABNORMAL LOW (ref 11.6–15.9)
MCH: 32.9 pg (ref 26.0–34.0)
MCHC: 33.8 g/dL (ref 32.0–36.0)
MCV: 97 fL (ref 81–101)
Platelets: 288 10*3/uL (ref 145–400)
RBC: 2.16 10*6/uL — AB (ref 3.70–5.32)
RDW: 23.1 % — ABNORMAL HIGH (ref 11.1–15.7)
WBC: 3.2 10*3/uL — ABNORMAL LOW (ref 3.9–10.0)

## 2014-02-09 LAB — IRON AND TIBC CHCC
%SAT: UNDETERMINED % (ref 21–?)
IRON: 179 ug/dL — AB (ref 41–142)
TIBC: 174 ug/dL — AB (ref 236–444)
UIBC: UNDETERMINED ug/dL (ref 120–384)

## 2014-02-09 LAB — MANUAL DIFFERENTIAL (CHCC SATELLITE)
ALC: 0.5 10*3/uL — ABNORMAL LOW (ref 0.6–2.2)
ANC (CHCC HP manual diff): 2.1 10*3/uL (ref 1.5–6.7)
BASO: 1 % (ref 0–2)
Band Neutrophils: 4 % (ref 0–10)
Eos: 9 % — ABNORMAL HIGH (ref 0–7)
LYMPH: 16 % (ref 14–48)
MONO: 8 % (ref 0–13)
Other Cells: 2 % — ABNORMAL HIGH (ref 0–0)
PLT EST ~~LOC~~: ADEQUATE
SEG: 60 % (ref 40–75)

## 2014-02-09 LAB — HOLD TUBE, BLOOD BANK - CHCC SATELLITE

## 2014-02-09 LAB — FERRITIN CHCC

## 2014-02-09 LAB — RETICULOCYTES (CHCC)
ABS Retic: 33 10*3/uL (ref 19.0–186.0)
RBC.: 2.2 MIL/uL — ABNORMAL LOW (ref 3.87–5.11)
Retic Ct Pct: 1.5 % (ref 0.4–2.3)

## 2014-02-09 LAB — CHCC SATELLITE - SMEAR

## 2014-02-09 NOTE — Progress Notes (Addendum)
Hematology and Oncology Follow Up Visit  Monica Riggs [id number] April 10, 1919 80 y.o. 02/09/2014   Principle Diagnosis:   Refractory anemia-low risk  Current Therapy:    Palliative transfusions     Interim History:  Monica Riggs is back for followup. She was last transfused on April 2. Since then she's been doing okay. She's had a little bit more in the way of some bony pain. I suspect that this probably is from the myelodysplasia.  She's had no bleeding. She's had no fever. Her appetite is down but she has not lost any weight. She's had no rashes. There's been no cough. Overall, her performance status is ECOG 2-3  Medications: Current outpatient prescriptions:aspirin 325 MG EC tablet, Take 325 mg by mouth daily., Disp: , Rfl: ;  atorvastatin (LIPITOR) 40 MG tablet, Take 40 mg by mouth daily., Disp: , Rfl: ;  brimonidine (ALPHAGAN) 0.2 % ophthalmic solution, Place 1 drop into both eyes 2 (two) times daily., Disp: , Rfl: ;  cholecalciferol (VITAMIN D) 1000 UNITS tablet, Take 1,000 Units by mouth daily., Disp: , Rfl:  co-enzyme Q-10 50 MG capsule, Take 50 mg by mouth daily., Disp: , Rfl: ;  Cyanocobalamin (VITAMIN B 12 PO), Take 1,000 mg by mouth every morning., Disp: , Rfl: ;  diltiazem (TIAZAC) 300 MG 24 hr capsule, Take 1 capsule (300 mg total) by mouth daily., Disp: 30 capsule, Rfl: 4;  folic acid (FOLVITE) 629 MCG tablet, Take 400 mcg by mouth 2 (two) times daily., Disp: , Rfl:  hydrochlorothiazide (HYDRODIURIL) 25 MG tablet, Take 1 tablet (25 mg total) by mouth daily., Disp: 30 tablet, Rfl: 9;  levothyroxine (SYNTHROID) 25 MCG tablet, Take 25 mcg by mouth daily before breakfast., Disp: , Rfl: ;  olmesartan (BENICAR) 40 MG tablet, Take 1 tablet (40 mg total) by mouth daily. Take 1/2 talblet (20mg ) daily, Disp: 30 tablet, Rfl: 4;  omega-3 acid ethyl esters (LOVAZA) 1 G capsule, Take 2 g by mouth daily., Disp: , Rfl:  Polyethylene Glycol 3350 (MIRALAX PO), Take by mouth as needed., Disp: , Rfl:    Allergies:  Allergies  Allergen Reactions  . Sulfa Antibiotics Rash    Past Medical History, Surgical history, Social history, and Family History were reviewed and updated.  Review of Systems: As above  Physical Exam:  height is 5\' 3"  (1.6 m) and weight is 123 lb (55.792 kg). Her oral temperature is 97.4 F (36.3 C). Her blood pressure is 115/48 and her pulse is 79. Her respiration is 14.   Elderly, petite white female. Lungs are clear. Cardiac exam regular rate and rhythm consistent with atrial fibrillation. She is a 1/6 murmur. Abdomen soft. Has good bowel sounds. There is no fluid wave. There is no palpable liver or spleen tip. Extremities shows chronic 1+ edema in her legs. Skin exam no rashes. Neurological exam no focal deficits.  Lab Results  Component Value Date   WBC 3.2* 02/09/2014   HGB 7.1* 02/09/2014   HCT 21.0* 02/09/2014   MCV 97 02/09/2014   PLT 288 02/09/2014     Chemistry      Component Value Date/Time   NA 136 01/04/2014 1047   NA 139 04/27/2013 1616   K 4.0 01/04/2014 1047   CL 98 01/04/2014 1047   CO2 21 01/04/2014 1047   BUN 14 01/04/2014 1047   BUN 18 04/27/2013 1616   CREATININE 1.00 01/04/2014 1047   CREATININE 1.02 04/27/2013 1616      Component Value Date/Time  CALCIUM 9.8 01/04/2014 1047   ALKPHOS 95 01/04/2014 1047   AST 16 01/04/2014 1047   ALT 17 01/04/2014 1047   BILITOT 0.8 01/04/2014 1047         Impression and Plan: Ms. Evangelist is 80 year old female with refractory anemia. She actually done very well. She currently is transfused every 4-6 weeks.  Suspect that she likely will need to be transfused in 2 or 3 weeks.  We'll get her back in 3 weeks. We  will consider her for a transfusion that same day.   Volanda Napoleon, MD 4/28/201510:26 AM

## 2014-02-12 ENCOUNTER — Ambulatory Visit (HOSPITAL_COMMUNITY)
Admission: RE | Admit: 2014-02-12 | Discharge: 2014-02-12 | Disposition: A | Discharge status: Home / Self Care | Payer: Medicare Other | Source: Ambulatory Visit | Attending: Hematology & Oncology | Admitting: Hematology & Oncology

## 2014-02-12 DIAGNOSIS — D469 Myelodysplastic syndrome, unspecified: Secondary | ICD-10-CM | POA: Insufficient documentation

## 2014-02-12 DIAGNOSIS — D649 Anemia, unspecified: Secondary | ICD-10-CM | POA: Insufficient documentation

## 2014-02-12 DIAGNOSIS — IMO0001 Reserved for inherently not codable concepts without codable children: Secondary | ICD-10-CM

## 2014-03-01 ENCOUNTER — Ambulatory Visit (HOSPITAL_BASED_OUTPATIENT_CLINIC_OR_DEPARTMENT_OTHER): Payer: Medicare Other | Admitting: Hematology & Oncology

## 2014-03-01 ENCOUNTER — Other Ambulatory Visit (HOSPITAL_BASED_OUTPATIENT_CLINIC_OR_DEPARTMENT_OTHER): Payer: Medicare Other | Admitting: Lab

## 2014-03-01 ENCOUNTER — Ambulatory Visit (HOSPITAL_BASED_OUTPATIENT_CLINIC_OR_DEPARTMENT_OTHER): Payer: Medicare Other

## 2014-03-01 ENCOUNTER — Encounter: Payer: Self-pay | Admitting: Hematology & Oncology

## 2014-03-01 VITALS — BP 143/69 | HR 66 | Temp 97.0°F | Resp 16

## 2014-03-01 VITALS — BP 100/45 | HR 83 | Temp 97.5°F | Resp 18 | Ht 64.0 in | Wt 128.0 lb

## 2014-03-01 DIAGNOSIS — D469 Myelodysplastic syndrome, unspecified: Principal | ICD-10-CM

## 2014-03-01 DIAGNOSIS — IMO0001 Reserved for inherently not codable concepts without codable children: Secondary | ICD-10-CM

## 2014-03-01 DIAGNOSIS — D4620 Refractory anemia with excess of blasts, unspecified: Secondary | ICD-10-CM

## 2014-03-01 LAB — MANUAL DIFFERENTIAL (CHCC SATELLITE)
ALC: 0.8 10*3/uL (ref 0.6–2.2)
ANC (CHCC MAN DIFF): 2.7 10*3/uL (ref 1.5–6.7)
BASO: 3 % — ABNORMAL HIGH (ref 0–2)
EOS: 6 % (ref 0–7)
LYMPH: 19 % (ref 14–48)
METAMYELOCYTES PCT: 1 % — AB (ref 0–0)
MONO: 7 % (ref 0–13)
PLT EST ~~LOC~~: ADEQUATE
SEG: 64 % (ref 40–75)

## 2014-03-01 LAB — HOLD TUBE, BLOOD BANK - CHCC SATELLITE

## 2014-03-01 LAB — CBC WITH DIFFERENTIAL (CANCER CENTER ONLY)
HCT: 16.2 % — ABNORMAL LOW (ref 34.8–46.6)
HGB: 5.5 g/dL — CL (ref 11.6–15.9)
MCH: 34.8 pg — AB (ref 26.0–34.0)
MCHC: 34 g/dL (ref 32.0–36.0)
MCV: 103 fL — ABNORMAL HIGH (ref 81–101)
PLATELETS: 299 10*3/uL (ref 145–400)
RBC: 1.58 10*6/uL — ABNORMAL LOW (ref 3.70–5.32)
WBC: 4.2 10*3/uL (ref 3.9–10.0)

## 2014-03-01 LAB — PREPARE RBC (CROSSMATCH)

## 2014-03-01 MED ORDER — ACETAMINOPHEN 325 MG PO TABS
ORAL_TABLET | ORAL | Status: AC
  Filled 2014-03-01: qty 2

## 2014-03-01 MED ORDER — SODIUM CHLORIDE 0.9 % IV SOLN
Freq: Once | INTRAVENOUS | Status: AC
  Administered 2014-03-01: 11:00:00 via INTRAVENOUS

## 2014-03-01 MED ORDER — DIPHENHYDRAMINE HCL 25 MG PO CAPS
25.0000 mg | ORAL_CAPSULE | Freq: Once | ORAL | Status: AC
  Administered 2014-03-01: 25 mg via ORAL

## 2014-03-01 MED ORDER — DIPHENHYDRAMINE HCL 25 MG PO CAPS
ORAL_CAPSULE | ORAL | Status: AC
  Filled 2014-03-01: qty 1

## 2014-03-01 MED ORDER — ACETAMINOPHEN 325 MG PO TABS
650.0000 mg | ORAL_TABLET | Freq: Once | ORAL | Status: AC
  Administered 2014-03-01: 650 mg via ORAL

## 2014-03-01 NOTE — Patient Instructions (Signed)
Blood Transfusion  A blood transfusion replaces your blood or some of its parts. Blood is replaced when you have lost blood because of surgery, an accident, or for severe blood conditions like anemia. You can donate blood to be used on yourself if you have a planned surgery. If you lose blood during that surgery, your own blood can be given back to you. Any blood given to you is checked to make sure it matches your blood type. Your temperature, blood pressure, and heart rate (vital signs) will be checked often.  GET HELP RIGHT AWAY IF:   You feel sick to your stomach (nauseous) or throw up (vomit).  You have watery poop (diarrhea).  You have shortness of breath or trouble breathing.  You have blood in your pee (urine) or have dark colored pee.  You have chest pain or tightness.  Your eyes or skin turn yellow (jaundice).  You have a temperature by mouth above 102 F (38.9 C), not controlled by medicine.  You start to shake and have chills.  You develop a a red rash (hives) or feel itchy.  You develop lightheadedness or feel confused.  You develop back, joint, or muscle pain.  You do not feel hungry (lost appetite).  You feel tired, restless, or nervous.  You develop belly (abdominal) cramps. Document Released: 12/28/2008 Document Revised: 12/24/2011 Document Reviewed: 12/28/2008 Exit[employer] Patient Information 2014 [employer].  

## 2014-03-01 NOTE — Progress Notes (Signed)
Hematology and Oncology Follow Up Visit  Monica Riggs [id number] 1919/03/25 80 y.o. 03/01/2014   Principle Diagnosis:   Refractory anemia-low risk  Current Therapy:    Palliative transfusions     Interim History:  Ms.  Riggs is back for followup. She was last transfused on April 2. She clearly is quite pale. She's very tired. She really has not been noted to do much of anything. She did not even cook over Mother's Day. She does get short of breath quite easily. She's had a little bit more in the way of some bony pain. I suspect that this probably is from the myelodysplasia.  She's had no bleeding. She's had no fever. Her appetite is down but she has not lost any weight. She's had no rashes. There's been no cough. Overall, her performance status is ECOG 2-3  Medications: Current outpatient prescriptions:aspirin 325 MG EC tablet, Take 325 mg by mouth daily., Disp: , Rfl: ;  atorvastatin (LIPITOR) 40 MG tablet, Take 40 mg by mouth daily., Disp: , Rfl: ;  brimonidine (ALPHAGAN) 0.2 % ophthalmic solution, Place 1 drop into both eyes 2 (two) times daily., Disp: , Rfl: ;  cholecalciferol (VITAMIN D) 1000 UNITS tablet, Take 1,000 Units by mouth daily., Disp: , Rfl:  co-enzyme Q-10 50 MG capsule, Take 50 mg by mouth daily., Disp: , Rfl: ;  Cyanocobalamin (VITAMIN B 12 PO), Take 1,000 mg by mouth every morning., Disp: , Rfl: ;  diltiazem (TIAZAC) 300 MG 24 hr capsule, Take 1 capsule (300 mg total) by mouth daily., Disp: 30 capsule, Rfl: 4;  folic acid (FOLVITE) 229 MCG tablet, Take 400 mcg by mouth 2 (two) times daily., Disp: , Rfl:  hydrochlorothiazide (HYDRODIURIL) 25 MG tablet, Take 1 tablet (25 mg total) by mouth daily., Disp: 30 tablet, Rfl: 9;  levothyroxine (SYNTHROID) 25 MCG tablet, Take 25 mcg by mouth daily before breakfast., Disp: , Rfl: ;  olmesartan (BENICAR) 40 MG tablet, Take 1 tablet (40 mg total) by mouth daily. Take 1/2 talblet (20mg ) daily, Disp: 30 tablet, Rfl: 4;  omega-3 acid ethyl  esters (LOVAZA) 1 G capsule, Take 2 g by mouth daily., Disp: , Rfl:  Polyethylene Glycol 3350 (MIRALAX PO), Take by mouth as needed., Disp: , Rfl:   Allergies:  Allergies  Allergen Reactions  . Sulfa Antibiotics Rash    Past Medical History, Surgical history, Social history, and Family History were reviewed and updated.  Review of Systems: As above  Physical Exam:  height is 5\' 4"  (1.626 m) and weight is 128 lb (58.06 kg). Her oral temperature is 97.5 F (36.4 C). Her blood pressure is 100/45 and her pulse is 83. Her respiration is 18.   Elderly, petite white female. Lungs are clear. Cardiac exam irregular rate and irregular consistent with atrial fibrillation. She is a 1/6 murmur. Abdomen soft. Has good bowel sounds. There is no fluid wave. There is no palpable liver or spleen tip. Extremities shows chronic 1+ edema in her legs. Skin exam no rashes. Neurological exam no focal deficits.  Lab Results  Component Value Date   WBC 4.2 03/01/2014   HGB 5.5* 03/01/2014   HCT 16.2* 03/01/2014   MCV 103* 03/01/2014   PLT 299 03/01/2014     Chemistry      Component Value Date/Time   NA 136 01/04/2014 1047   NA 139 04/27/2013 1616   K 4.0 01/04/2014 1047   CL 98 01/04/2014 1047   CO2 21 01/04/2014 1047   BUN  14 01/04/2014 1047   BUN 18 04/27/2013 1616   CREATININE 1.00 01/04/2014 1047   CREATININE 1.02 04/27/2013 1616      Component Value Date/Time   CALCIUM 9.8 01/04/2014 1047   ALKPHOS 95 01/04/2014 1047   AST 16 01/04/2014 1047   ALT 17 01/04/2014 1047   BILITOT 0.8 01/04/2014 1047         Impression and Plan: Monica Riggs is 80 year old female with refractory anemia. We will transfuse her today. She actually has done quite well. His been probably 6 or 7 weeks since she was transfused.  We will go ahead and plan to get her back in another 4 weeks.  I would be incredibly surprised if she made it through this year. I just suspect that she is gradually transforming over to a more  aggressive process.   Volanda Napoleon, MD 5/18/201511:43 AM

## 2014-03-02 ENCOUNTER — Other Ambulatory Visit: Payer: Self-pay | Admitting: *Deleted

## 2014-03-02 LAB — TYPE AND SCREEN
ABO/RH(D): O NEG
Antibody Screen: NEGATIVE
Unit division: 0
Unit division: 0

## 2014-03-02 MED ORDER — DILTIAZEM HCL ER BEADS 300 MG PO CP24: 300.0000 mg | ORAL_CAPSULE | Freq: Every day | ORAL | Status: DC

## 2014-03-03 ENCOUNTER — Encounter: Payer: Self-pay | Admitting: Hematology & Oncology

## 2014-03-11 ENCOUNTER — Other Ambulatory Visit: Payer: Self-pay | Admitting: Family Medicine

## 2014-03-15 ENCOUNTER — Ambulatory Visit (HOSPITAL_COMMUNITY)
Admission: RE | Admit: 2014-03-15 | Discharge: 2014-03-15 | Disposition: A | Discharge status: Home / Self Care | Payer: Medicare Other | Source: Ambulatory Visit | Attending: Hematology & Oncology | Admitting: Hematology & Oncology

## 2014-03-15 DIAGNOSIS — D46Z Other myelodysplastic syndromes: Secondary | ICD-10-CM | POA: Insufficient documentation

## 2014-03-15 DIAGNOSIS — D649 Anemia, unspecified: Secondary | ICD-10-CM | POA: Insufficient documentation

## 2014-03-29 ENCOUNTER — Other Ambulatory Visit (HOSPITAL_BASED_OUTPATIENT_CLINIC_OR_DEPARTMENT_OTHER): Payer: Medicare Other | Admitting: Lab

## 2014-03-29 ENCOUNTER — Ambulatory Visit (HOSPITAL_BASED_OUTPATIENT_CLINIC_OR_DEPARTMENT_OTHER): Payer: Medicare Other

## 2014-03-29 ENCOUNTER — Encounter: Payer: Self-pay | Admitting: Hematology & Oncology

## 2014-03-29 ENCOUNTER — Ambulatory Visit (HOSPITAL_BASED_OUTPATIENT_CLINIC_OR_DEPARTMENT_OTHER): Payer: Medicare Other | Admitting: Hematology & Oncology

## 2014-03-29 VITALS — BP 94/47 | HR 85 | Temp 97.7°F | Resp 14 | Ht 63.0 in | Wt 118.0 lb

## 2014-03-29 VITALS — BP 110/60 | HR 80 | Temp 98.0°F | Resp 16

## 2014-03-29 DIAGNOSIS — IMO0001 Reserved for inherently not codable concepts without codable children: Secondary | ICD-10-CM

## 2014-03-29 DIAGNOSIS — D46Z Other myelodysplastic syndromes: Secondary | ICD-10-CM

## 2014-03-29 DIAGNOSIS — D469 Myelodysplastic syndrome, unspecified: Secondary | ICD-10-CM

## 2014-03-29 LAB — HOLD TUBE, BLOOD BANK - CHCC SATELLITE

## 2014-03-29 LAB — CBC WITH DIFFERENTIAL (CANCER CENTER ONLY)
HCT: 20.1 % — ABNORMAL LOW (ref 34.8–46.6)
HGB: 6.7 g/dL — CL (ref 11.6–15.9)
MCH: 33.7 pg (ref 26.0–34.0)
MCHC: 33.3 g/dL (ref 32.0–36.0)
MCV: 101 fL (ref 81–101)
PLATELETS: 297 10*3/uL (ref 145–400)
RBC: 1.99 10*6/uL — ABNORMAL LOW (ref 3.70–5.32)
RDW: 23.9 % — ABNORMAL HIGH (ref 11.1–15.7)
WBC: 2.9 10*3/uL — ABNORMAL LOW (ref 3.9–10.0)

## 2014-03-29 LAB — MANUAL DIFFERENTIAL (CHCC SATELLITE)
ALC: 0.7 10*3/uL (ref 0.6–2.2)
ANC (CHCC HP manual diff): 1.8 10*3/uL (ref 1.5–6.7)
BAND NEUTROPHILS: 2 % (ref 0–10)
BASO: 2 % (ref 0–2)
EOS: 9 % — AB (ref 0–7)
LYMPH: 25 % (ref 14–48)
MONO: 1 % (ref 0–13)
PLT EST ~~LOC~~: ADEQUATE
SEG: 61 % (ref 40–75)

## 2014-03-29 LAB — PREPARE RBC (CROSSMATCH)

## 2014-03-29 MED ORDER — FUROSEMIDE 10 MG/ML IJ SOLN
10.0000 mg | Freq: Once | INTRAMUSCULAR | Status: AC
  Administered 2014-03-29: 10 mg via INTRAVENOUS

## 2014-03-29 MED ORDER — SODIUM CHLORIDE 0.9 % IV SOLN
250.0000 mL | Freq: Once | INTRAVENOUS | Status: AC
  Administered 2014-03-29: 250 mL via INTRAVENOUS

## 2014-03-29 MED ORDER — DIPHENHYDRAMINE HCL 25 MG PO CAPS
25.0000 mg | ORAL_CAPSULE | Freq: Once | ORAL | Status: AC
  Administered 2014-03-29: 25 mg via ORAL

## 2014-03-29 MED ORDER — ACETAMINOPHEN 325 MG PO TABS
ORAL_TABLET | ORAL | Status: AC
  Filled 2014-03-29: qty 2

## 2014-03-29 MED ORDER — DIPHENHYDRAMINE HCL 25 MG PO CAPS
ORAL_CAPSULE | ORAL | Status: AC
  Filled 2014-03-29: qty 1

## 2014-03-29 MED ORDER — ACETAMINOPHEN 325 MG PO TABS
650.0000 mg | ORAL_TABLET | Freq: Four times a day (QID) | ORAL | Status: DC | PRN
  Administered 2014-03-29: 650 mg via ORAL

## 2014-03-29 MED ORDER — FUROSEMIDE 10 MG/ML IJ SOLN
INTRAMUSCULAR | Status: AC
  Filled 2014-03-29: qty 4

## 2014-03-29 NOTE — Patient Instructions (Signed)
Blood Transfusion Information WHAT IS A BLOOD TRANSFUSION? A transfusion is the replacement of blood or some of its parts. Blood is made up of multiple cells which provide different functions.  Red blood cells carry oxygen and are used for blood loss replacement.  White blood cells fight against infection.  Platelets control bleeding.  Plasma helps clot blood.  Other blood products are available for specialized needs, such as hemophilia or other clotting disorders. BEFORE THE TRANSFUSION  Who gives blood for transfusions?   You may be able to donate blood to be used at a later date on yourself (autologous donation).  Relatives can be asked to donate blood. This is generally not any safer than if you have received blood from a stranger. The same precautions are taken to ensure safety when a relative's blood is donated.  Healthy volunteers who are fully evaluated to make sure their blood is safe. This is blood bank blood. Transfusion therapy is the safest it has ever been in the practice of medicine. Before blood is taken from a donor, a complete history is taken to make sure that person has no history of diseases nor engages in risky social behavior (examples are intravenous drug use or sexual activity with multiple partners). The donor's travel history is screened to minimize risk of transmitting infections, such as malaria. The donated blood is tested for signs of infectious diseases, such as HIV and hepatitis. The blood is then tested to be sure it is compatible with you in order to minimize the chance of a transfusion reaction. If you or a relative donates blood, this is often done in anticipation of surgery and is not appropriate for emergency situations. It takes many days to process the donated blood. RISKS AND COMPLICATIONS Although transfusion therapy is very safe and saves many lives, the main dangers of transfusion include:   Getting an infectious disease.  Developing a  transfusion reaction. This is an allergic reaction to something in the blood you were given. Every precaution is taken to prevent this. The decision to have a blood transfusion has been considered carefully by your caregiver before blood is given. Blood is not given unless the benefits outweigh the risks. AFTER THE TRANSFUSION  Right after receiving a blood transfusion, you will usually feel much better and more energetic. This is especially true if your red blood cells have gotten low (anemic). The transfusion raises the level of the red blood cells which carry oxygen, and this usually causes an energy increase.  The nurse administering the transfusion will monitor you carefully for complications. HOME CARE INSTRUCTIONS  No special instructions are needed after a transfusion. You may find your energy is better. Speak with your caregiver about any limitations on activity for underlying diseases you may have. SEEK MEDICAL CARE IF:   Your condition is not improving after your transfusion.  You develop redness or irritation at the intravenous (IV) site. SEEK IMMEDIATE MEDICAL CARE IF:  Any of the following symptoms occur over the next 12 hours:  Shaking chills.  You have a temperature by mouth above 102 F (38.9 C), not controlled by medicine.  Chest, back, or muscle pain.  People around you feel you are not acting correctly or are confused.  Shortness of breath or difficulty breathing.  Dizziness and fainting.  You get a rash or develop hives.  You have a decrease in urine output.  Your urine turns a dark color or changes to pink, red, or brown. Any of the following   symptoms occur over the next 10 days:  You have a temperature by mouth above 102 F (38.9 C), not controlled by medicine.  Shortness of breath.  Weakness after normal activity.  The white part of the eye turns yellow (jaundice).  You have a decrease in the amount of urine or are urinating less often.  Your  urine turns a dark color or changes to pink, red, or brown. Document Released: 09/28/2000 Document Revised: 12/24/2011 Document Reviewed: 05/17/2008 [employer] Patient Information 2014 [employer].  

## 2014-03-29 NOTE — Progress Notes (Signed)
Hematology and Oncology Follow Up Visit  Monica Riggs [id number] June 22, 1919 80 y.o. 03/29/2014   Principle Diagnosis:   Refractory anemia  Current Therapy:    Supportive care with transfusions     Interim History:  Monica Riggs is back for followup. Unfortunately, she is declining a little bit. Her weight has gone down 10 pounds and last saw her. She's had no problems with her heart as far as the family knows. She's been no issues with her thyroid. She just does not have a lot of energy. we probably did transfuse her a month ago.  She's had no obvious bleeding. There's been no cough. She had some shortness of breath. She's had some mild leg swelling. She's had no rashes. She's had no headache.  I talked to her about hospice. Her with the hospice will be important for her and her family. Her family clearly is in favor of hospice. At some coaxing but she agreed.  Medications: Current outpatient prescriptions:aspirin 325 MG EC tablet, Take 325 mg by mouth daily., Disp: , Rfl: ;  atorvastatin (LIPITOR) 40 MG tablet, Take 40 mg by mouth daily., Disp: , Rfl: ;  brimonidine (ALPHAGAN) 0.2 % ophthalmic solution, Place 1 drop into both eyes 2 (two) times daily., Disp: , Rfl: ;  cholecalciferol (VITAMIN D) 1000 UNITS tablet, Take 1,000 Units by mouth daily., Disp: , Rfl:  co-enzyme Q-10 50 MG capsule, Take 50 mg by mouth daily., Disp: , Rfl: ;  Cyanocobalamin (VITAMIN B 12 PO), Take 1,000 mg by mouth every morning., Disp: , Rfl: ;  diltiazem (TIAZAC) 300 MG 24 hr capsule, Take 1 capsule (300 mg total) by mouth daily., Disp: 5 capsule, Rfl: 0;  folic acid (FOLVITE) 627 MCG tablet, Take 400 mcg by mouth 2 (two) times daily., Disp: , Rfl:  hydrochlorothiazide (HYDRODIURIL) 25 MG tablet, Take 1 tablet (25 mg total) by mouth daily., Disp: 30 tablet, Rfl: 9;  levothyroxine (SYNTHROID) 25 MCG tablet, Take 25 mcg by mouth daily before breakfast., Disp: , Rfl: ;  olmesartan (BENICAR) 40 MG tablet, Take 1 tablet  (40 mg total) by mouth daily. Take 1/2 talblet (20mg ) daily, Disp: 30 tablet, Rfl: 4;  omega-3 acid ethyl esters (LOVAZA) 1 G capsule, Take 2 g by mouth daily., Disp: , Rfl:  Polyethylene Glycol 3350 (MIRALAX PO), Take by mouth as needed., Disp: , Rfl:  No current facility-administered medications for this visit. Facility-Administered Medications Ordered in Other Visits: acetaminophen (TYLENOL) tablet 650 mg, 650 mg, Oral, Q6H PRN, Volanda Napoleon, MD, 650 mg at 03/29/14 1133  Allergies:  Allergies  Allergen Reactions  . Sulfa Antibiotics Rash    Past Medical History, Surgical history, Social history, and Family History were reviewed and updated.  Review of Systems: As above  Physical Exam:  height is 5\' 3"  (1.6 m) and weight is 118 lb (53.524 kg). Her oral temperature is 97.7 F (36.5 C). Her blood pressure is 94/47 and her pulse is 85. Her respiration is 14.   Totally, petite white female. She has some temporal muscle wasting. Conjunctiva are pale. She has no oral lesions. She has clear lungs. Cardiac exam regular rate and rhythm. Abdomen soft. There is no palpable liver or spleen. There is no ascites. Extremities shows no clubbing cyanosis or edema. Neurological exam shows no focal neurological deficits. Skin exam is pale with no rashes or ecchymosis.  Lab Results  Component Value Date   WBC 2.9* 03/29/2014   HGB 6.7* 03/29/2014   HCT  20.1* 03/29/2014   MCV 101 03/29/2014   PLT 297 03/29/2014     Chemistry      Component Value Date/Time   NA 136 01/04/2014 1047   NA 139 04/27/2013 1616   K 4.0 01/04/2014 1047   CL 98 01/04/2014 1047   CO2 21 01/04/2014 1047   BUN 14 01/04/2014 1047   BUN 18 04/27/2013 1616   CREATININE 1.00 01/04/2014 1047   CREATININE 1.02 04/27/2013 1616      Component Value Date/Time   CALCIUM 9.8 01/04/2014 1047   ALKPHOS 95 01/04/2014 1047   AST 16 01/04/2014 1047   ALT 17 01/04/2014 1047   BILITOT 0.8 01/04/2014 1047         Impression and Plan: Monica Riggs is 80 year old female with refractory anemia. She used to be transfused again.  We will get hospice. I really think they helped quite a bit.  See him hospice draw her blood every couple weeks. I this will make it a lot easier for Monica Riggs.  For now, we will transfuse her this week. I this will help her.  We will see about trying to get her down here to see [place] in about 6 weeks. I'm sure that we may have to get her in sooner if she does need any transfusions. She's been incredibly well. However, the weight loss is very troublesome and certainly could be an indicator of what is to come for her in the future. I suspect that she probably will want to move in with 1 her family members at some point. Again getting hospice will make it easier for her to stay home.   Volanda Napoleon, MD 6/15/20154:30 PM

## 2014-03-30 LAB — TYPE AND SCREEN
ABO/RH(D): O NEG
ANTIBODY SCREEN: NEGATIVE
UNIT DIVISION: 0
Unit division: 0

## 2014-03-31 ENCOUNTER — Telehealth: Payer: Self-pay | Admitting: Hematology & Oncology

## 2014-03-31 ENCOUNTER — Encounter: Payer: Self-pay | Admitting: Hematology & Oncology

## 2014-03-31 NOTE — Telephone Encounter (Signed)
Pt aware of 7-29 appointment °

## 2014-04-08 ENCOUNTER — Other Ambulatory Visit: Payer: Self-pay | Admitting: Family Medicine

## 2014-04-28 ENCOUNTER — Encounter: Payer: Self-pay | Admitting: *Deleted

## 2014-05-09 ENCOUNTER — Other Ambulatory Visit: Payer: Self-pay | Admitting: Family Medicine

## 2014-05-10 NOTE — Telephone Encounter (Signed)
Last seen 10/01/13

## 2014-05-11 ENCOUNTER — Ambulatory Visit: Payer: Medicare Other | Admitting: Family Medicine

## 2014-05-12 ENCOUNTER — Other Ambulatory Visit (HOSPITAL_BASED_OUTPATIENT_CLINIC_OR_DEPARTMENT_OTHER): Payer: Medicare Other | Admitting: Lab

## 2014-05-12 ENCOUNTER — Ambulatory Visit (HOSPITAL_COMMUNITY)
Admission: RE | Admit: 2014-05-12 | Discharge: 2014-05-12 | Disposition: A | Discharge status: Home / Self Care | Payer: Medicare Other | Source: Ambulatory Visit | Attending: Hematology & Oncology | Admitting: Hematology & Oncology

## 2014-05-12 ENCOUNTER — Ambulatory Visit (HOSPITAL_BASED_OUTPATIENT_CLINIC_OR_DEPARTMENT_OTHER): Admitting: Hematology & Oncology

## 2014-05-12 VITALS — BP 107/56 | HR 102 | Temp 96.9°F | Resp 16 | Wt 118.0 lb

## 2014-05-12 DIAGNOSIS — D4620 Refractory anemia with excess of blasts, unspecified: Secondary | ICD-10-CM

## 2014-05-12 DIAGNOSIS — D649 Anemia, unspecified: Secondary | ICD-10-CM | POA: Insufficient documentation

## 2014-05-12 DIAGNOSIS — D46Z Other myelodysplastic syndromes: Secondary | ICD-10-CM

## 2014-05-12 LAB — CBC WITH DIFFERENTIAL (CANCER CENTER ONLY)
HCT: 19.1 % — ABNORMAL LOW (ref 34.8–46.6)
HGB: 6.6 g/dL — CL (ref 11.6–15.9)
MCH: 34 pg (ref 26.0–34.0)
MCHC: 34.6 g/dL (ref 32.0–36.0)
MCV: 99 fL (ref 81–101)
PLATELETS: 233 10*3/uL (ref 145–400)
RBC: 1.94 10*6/uL — AB (ref 3.70–5.32)
WBC: 2 10*3/uL — ABNORMAL LOW (ref 3.9–10.0)

## 2014-05-12 LAB — MANUAL DIFFERENTIAL (CHCC SATELLITE)
ALC: 0.6 10*3/uL (ref 0.6–2.2)
ANC (CHCC HP manual diff): 1.1 10*3/uL — ABNORMAL LOW (ref 1.5–6.7)
BASO: 1 % (ref 0–2)
Band Neutrophils: 1 % (ref 0–10)
EOS: 7 % (ref 0–7)
LYMPH: 29 % (ref 14–48)
MONO: 7 % (ref 0–13)
Metamyelocytes: 1 % — ABNORMAL HIGH (ref 0–0)
PLT EST ~~LOC~~: ADEQUATE
SEG: 54 % (ref 40–75)

## 2014-05-12 LAB — HOLD TUBE, BLOOD BANK - CHCC SATELLITE

## 2014-05-12 LAB — PREALBUMIN: PREALBUMIN: 22.1 mg/dL (ref 17.0–34.0)

## 2014-05-12 NOTE — Progress Notes (Signed)
Hematology and Oncology Follow Up Visit  Monica Riggs [id number] 1918-12-11 80 y.o. 05/12/2014   Principle Diagnosis:  Refractory anemia  Current Therapy:   Supportive care with transfusions [facility]     Interim History:  Monica Riggs is back for followup. She is now be seen by hospice. There are do a good job with her. She is still at home.  She was last transfused back 6 weeks ago. She is a goes 6-8 weeks between transfusions. She's had no bleeding. Her appetite been okay. She's not lost any additional weight. (Not hurting. She's had no cough. There's been no shortness of breath. Her overall, her performance status is ECOG 3.  Medications: Current outpatient prescriptions:aspirin 325 MG EC tablet, Take 325 mg by mouth daily., Disp: , Rfl: ;  atorvastatin (LIPITOR) 80 MG tablet, TAKE 1/2 TO 1 TABLET DAILY, Disp: 30 tablet, Rfl: 0;  BENICAR 40 MG tablet, TAKE ONE (1) TABLET EACH DAY, Disp: 30 tablet, Rfl: 2;  brimonidine (ALPHAGAN) 0.2 % ophthalmic solution, Place 1 drop into both eyes 2 (two) times daily., Disp: , Rfl:  cholecalciferol (VITAMIN D) 1000 UNITS tablet, Take 1,000 Units by mouth daily., Disp: , Rfl: ;  co-enzyme Q-10 50 MG capsule, Take 50 mg by mouth daily., Disp: , Rfl: ;  Cyanocobalamin (VITAMIN B 12 PO), Take 1,000 mg by mouth every morning., Disp: , Rfl: ;  diltiazem (CARDIZEM CD) 300 MG 24 hr capsule, TAKE ONE (1) CAPSULE EACH DAY, Disp: 30 capsule, Rfl: 2 diltiazem (TIAZAC) 300 MG 24 hr capsule, Take 1 capsule (300 mg total) by mouth daily., Disp: 5 capsule, Rfl: 0;  folic acid (FOLVITE) 353 MCG tablet, Take 400 mcg by mouth 2 (two) times daily., Disp: , Rfl: ;  hydrochlorothiazide (HYDRODIURIL) 25 MG tablet, TAKE ONE (1) TABLET EACH DAY, Disp: 30 tablet, Rfl: 2;  levothyroxine (SYNTHROID) 25 MCG tablet, Take 25 mcg by mouth daily before breakfast., Disp: , Rfl:  omega-3 acid ethyl esters (LOVAZA) 1 G capsule, Take 2 g by mouth daily., Disp: , Rfl: ;   Polyethylene Glycol 3350 (MIRALAX PO), Take by mouth as needed., Disp: , Rfl:   Allergies:  Allergies  Allergen Reactions  . Sulfa Antibiotics Rash    Past Medical History, Surgical history, Social history, and Family History were reviewed and updated.  Review of Systems: As above  Physical Exam:  weight is 118 lb (53.524 kg). Her oral temperature is 96.9 F (36.1 C). Her blood pressure is 107/56 and her pulse is 102. Her respiration is 16.   Totally, petite white female. She is somewhat pale. Lungs are clear. Cardiac exam regular rate and rhythm. She has an occasional extra beat. She is a 2/6 systolic ejection murmur. Abdomen is soft. She good bowel sounds. There is no fluid wave is no palpable liver or spleen tip. Back exam shows no tenderness over the spine ribs or hips. Chest slight kyphosis. Extremities shows minimal edema in her lower legs. Skin exam shows no rashes or ecchymosis.  Lab Results  Component Value Date   WBC 2.0* 05/12/2014   HGB 6.6* 05/12/2014   HCT 19.1* 05/12/2014   MCV 99 05/12/2014   PLT 233 05/12/2014     Chemistry      Component Value Date/Time   NA 136 01/04/2014 1047   NA 139 04/27/2013 1616   K 4.0 01/04/2014 1047   CL 98 01/04/2014 1047   CO2 21 01/04/2014 1047   BUN 14 01/04/2014 1047  BUN 18 04/27/2013 1616   CREATININE 1.00 01/04/2014 1047   CREATININE 1.02 04/27/2013 1616      Component Value Date/Time   CALCIUM 9.8 01/04/2014 1047   ALKPHOS 95 01/04/2014 1047   AST 16 01/04/2014 1047   ALT 17 01/04/2014 1047   BILITOT 0.8 01/04/2014 1047         Impression and Plan: Monica Riggs is a 80 year old white female. She is myelodysplasia.  Again, she's been doing as well as one could expect.  I haven't noted that her white cell count is slowly dropping. We'll have to be careful with this. She is somewhat neutropenic. We may need to consider Aranesp or new last him. Again, we have to be cautious.  We will go ahead and transfuse her tomorrow. This  always helps. She is responding to transfusions. At some point, she will become refractory. I am not yet seen that.  I would like to care back in 3 weeks' time.  Again, we will have to be cautious with her leukopenia.   Volanda Napoleon, MD 7/29/201510:52 AM

## 2014-05-13 ENCOUNTER — Telehealth: Payer: Self-pay | Admitting: Hematology & Oncology

## 2014-05-13 ENCOUNTER — Ambulatory Visit (HOSPITAL_BASED_OUTPATIENT_CLINIC_OR_DEPARTMENT_OTHER)

## 2014-05-13 ENCOUNTER — Encounter: Payer: Self-pay | Admitting: Hematology & Oncology

## 2014-05-13 ENCOUNTER — Ambulatory Visit: Payer: Medicare Other | Admitting: Family Medicine

## 2014-05-13 VITALS — BP 108/69 | HR 62 | Temp 96.9°F | Resp 16

## 2014-05-13 DIAGNOSIS — D649 Anemia, unspecified: Secondary | ICD-10-CM | POA: Diagnosis not present

## 2014-05-13 DIAGNOSIS — D4620 Refractory anemia with excess of blasts, unspecified: Secondary | ICD-10-CM

## 2014-05-13 LAB — PREPARE RBC (CROSSMATCH)

## 2014-05-13 MED ORDER — SODIUM CHLORIDE 0.9 % IV SOLN
250.0000 mL | Freq: Once | INTRAVENOUS | Status: AC
  Administered 2014-05-13: 250 mL via INTRAVENOUS

## 2014-05-13 MED ORDER — ACETAMINOPHEN 325 MG PO TABS
650.0000 mg | ORAL_TABLET | Freq: Once | ORAL | Status: AC
  Administered 2014-05-13: 650 mg via ORAL

## 2014-05-13 MED ORDER — DIPHENHYDRAMINE HCL 25 MG PO CAPS
25.0000 mg | ORAL_CAPSULE | Freq: Once | ORAL | Status: AC
  Administered 2014-05-13: 25 mg via ORAL

## 2014-05-13 MED ORDER — ACETAMINOPHEN 325 MG PO TABS
ORAL_TABLET | ORAL | Status: AC
  Filled 2014-05-13: qty 2

## 2014-05-13 MED ORDER — DIPHENHYDRAMINE HCL 25 MG PO CAPS
ORAL_CAPSULE | ORAL | Status: AC
  Filled 2014-05-13: qty 1

## 2014-05-13 NOTE — Patient Instructions (Signed)
Blood Transfusion Information WHAT IS A BLOOD TRANSFUSION? A transfusion is the replacement of blood or some of its parts. Blood is made up of multiple cells which provide different functions.  Red blood cells carry oxygen and are used for blood loss replacement.  White blood cells fight against infection.  Platelets control bleeding.  Plasma helps clot blood.  Other blood products are available for specialized needs, such as hemophilia or other clotting disorders. BEFORE THE TRANSFUSION  Who gives blood for transfusions?   You may be able to donate blood to be used at a later date on yourself (autologous donation).  Relatives can be asked to donate blood. This is generally not any safer than if you have received blood from a stranger. The same precautions are taken to ensure safety when a relative's blood is donated.  Healthy volunteers who are fully evaluated to make sure their blood is safe. This is blood bank blood. Transfusion therapy is the safest it has ever been in the practice of medicine. Before blood is taken from a donor, a complete history is taken to make sure that person has no history of diseases nor engages in risky social behavior (examples are intravenous drug use or sexual activity with multiple partners). The donor's travel history is screened to minimize risk of transmitting infections, such as malaria. The donated blood is tested for signs of infectious diseases, such as HIV and hepatitis. The blood is then tested to be sure it is compatible with you in order to minimize the chance of a transfusion reaction. If you or a relative donates blood, this is often done in anticipation of surgery and is not appropriate for emergency situations. It takes many days to process the donated blood. RISKS AND COMPLICATIONS Although transfusion therapy is very safe and saves many lives, the main dangers of transfusion include:   Getting an infectious disease.  Developing a  transfusion reaction. This is an allergic reaction to something in the blood you were given. Every precaution is taken to prevent this. The decision to have a blood transfusion has been considered carefully by your caregiver before blood is given. Blood is not given unless the benefits outweigh the risks. AFTER THE TRANSFUSION  Right after receiving a blood transfusion, you will usually feel much better and more energetic. This is especially true if your red blood cells have gotten low (anemic). The transfusion raises the level of the red blood cells which carry oxygen, and this usually causes an energy increase.  The nurse administering the transfusion will monitor you carefully for complications. HOME CARE INSTRUCTIONS  No special instructions are needed after a transfusion. You may find your energy is better. Speak with your caregiver about any limitations on activity for underlying diseases you may have. SEEK MEDICAL CARE IF:   Your condition is not improving after your transfusion.  You develop redness or irritation at the intravenous (IV) site. SEEK IMMEDIATE MEDICAL CARE IF:  Any of the following symptoms occur over the next 12 hours:  Shaking chills.  You have a temperature by mouth above 102 F (38.9 C), not controlled by medicine.  Chest, back, or muscle pain.  People around you feel you are not acting correctly or are confused.  Shortness of breath or difficulty breathing.  Dizziness and fainting.  You get a rash or develop hives.  You have a decrease in urine output.  Your urine turns a dark color or changes to pink, red, or brown. Any of the following   symptoms occur over the next 10 days:  You have a temperature by mouth above 102 F (38.9 C), not controlled by medicine.  Shortness of breath.  Weakness after normal activity.  The white part of the eye turns yellow (jaundice).  You have a decrease in the amount of urine or are urinating less often.  Your  urine turns a dark color or changes to pink, red, or brown. Document Released: 09/28/2000 Document Revised: 12/24/2011 Document Reviewed: 05/17/2008 ExitCare Patient Information 2015 [employer]. This information is not intended to replace advice given to you by your health care provider. Make sure you discuss any questions you have with your health care provider.  Platelet Transfusion Information This is information about transfusions of platelets. Platelets are tiny cells made by the bone marrow and found in the blood. When a blood vessel is damaged, platelets rush to the damaged area to help form a clot. This begins the healing process. When platelets get very low, your blood may have trouble clotting. This may be from:  Illness.  Blood disorder.  Chemotherapy to treat cancer. Often, lower platelet counts do not cause problems.  Platelets usually last for 7 to 10 days. If they are not used in an injury, they are broken down by the liver or spleen. Symptoms of low platelet count include:  Nosebleeds.  Bleeding gums.  Heavy periods.  Bruising and tiny blood spots in the skin.  Pinpoint spots of bleeding (petechiae).  Larger bruises (purpura).  Bleeding can be more serious if it happens in the brain or bowel. Platelet transfusions are often used to keep the platelet count at an acceptable level. Serious bleeding due to low platelets is uncommon. RISKS AND COMPLICATIONS Severe side effects from platelet transfusions are uncommon. Minor reactions may include:  Itching.  Rashes.  High temperature and shivering. Medications are available to stop transfusion reactions. Let your health care provider know if you develop any of the above problems.  If you are having platelet transfusions frequently, they may get less effective. This is called becoming refractory to platelets. It is uncommon. This can happen from non-immune causes and immune causes. Non-immune causes  include:  High temperatures.  Some medications.  An enlarged spleen. Immune causes happen when your body discovers the platelets are not your own and begins making antibodies against them. The antibodies kill the platelets quickly. Even with platelet transfusions, you may still notice problems with bleeding or bruising. Let your health care providers know about this. Other things can be done to help if this happens.  BEFORE THE PROCEDURE   Your health care provider will check your platelet count regularly.  If the platelet count is too low, it may be necessary to have a platelet transfusion.  This is more important before certain procedures with a risk of bleeding, such as a spinal tap.  Platelet transfusion reduces the risk of bleeding during or after the procedure.  Except in emergencies, giving a transfusion requires a written consent. Before blood is taken from a donor, a complete history is taken to make sure the person has no history of previous diseases, nor engages in risky social behavior. Examples of this are intravenous drug use or sexual activity with multiple partners. This could lead to infected blood or blood products being used. This history is taken in spite of the extensive testing to make sure the blood is safe. All blood products transfused are tested to make sure it is a match for the person getting   the blood. It is also checked for infections. Blood is the safest it has ever been. The risk of getting an infection is very low. PROCEDURE  The platelets are stored in small plastic bags that are kept at a low temperature.  Each bag is called a unit and sometimes two units are given. They are given through an intravenous line by drip infusion over about one-half hour.  Usually blood is collected from multiple people to get enough to transfuse.  Sometimes, the platelets are collected from a single person. This is done using a special machine that separates the platelets  from the blood. The machine is called an apheresis machine. Platelets collected in this way are called apheresed platelets. Apheresed platelets reduce the risk of becoming sensitive to the platelets. This lowers the chances of having a transfusion reaction.  As it only takes a short time to give the platelets, this treatment can be given in an outpatient department. Platelets can also be given before or after other treatments. SEEK IMMEDIATE MEDICAL CARE IF: You have any of the following symptoms over the next 12 hours or several days:  Shaking chills.  Fever with a temperature greater than 102F (38.9C) develops.  Back pain or muscle pain.  People around you feel you are not acting correctly, or you are confused.  Blood in the urine or bowel movements, or bleeding from any place in your body.  Shortness of breath, or difficulty breathing.  Dizziness.  Fainting.  You break out in a rash or develop hives.  Decrease in the amount of urine you are putting out, or the urine turns a dark color or changes to pink, red, or brown.  A severe headache or stiff neck.  Bruising more easily. Document Released: 07/29/2007 Document Revised: 02/15/2014 Document Reviewed: 07/29/2007 ExitCare Patient Information 2015 [employer]. This information is not intended to replace advice given to you by your health care provider. Make sure you discuss any questions you have with your health care provider.  

## 2014-05-13 NOTE — Telephone Encounter (Signed)
Pt moved 8-19 to 8-26 she needs to have mornings

## 2014-05-14 ENCOUNTER — Encounter: Payer: Self-pay | Admitting: Hematology & Oncology

## 2014-05-14 LAB — TYPE AND SCREEN
ABO/RH(D): O NEG
ANTIBODY SCREEN: NEGATIVE
Unit division: 0
Unit division: 0

## 2014-05-27 ENCOUNTER — Ambulatory Visit: Payer: Medicare Other | Admitting: Family Medicine

## 2014-05-27 ENCOUNTER — Ambulatory Visit (INDEPENDENT_AMBULATORY_CARE_PROVIDER_SITE_OTHER): Payer: Medicare Other | Admitting: Family Medicine

## 2014-05-27 ENCOUNTER — Encounter: Payer: Self-pay | Admitting: Family Medicine

## 2014-05-27 VITALS — BP 124/59 | HR 101 | Temp 97.0°F | Ht 63.0 in | Wt 122.0 lb

## 2014-05-27 DIAGNOSIS — E559 Vitamin D deficiency, unspecified: Secondary | ICD-10-CM

## 2014-05-27 DIAGNOSIS — E785 Hyperlipidemia, unspecified: Secondary | ICD-10-CM

## 2014-05-27 DIAGNOSIS — D508 Other iron deficiency anemias: Secondary | ICD-10-CM | POA: Diagnosis not present

## 2014-05-27 DIAGNOSIS — D469 Myelodysplastic syndrome, unspecified: Secondary | ICD-10-CM

## 2014-05-27 DIAGNOSIS — E039 Hypothyroidism, unspecified: Secondary | ICD-10-CM

## 2014-05-27 DIAGNOSIS — IMO0001 Reserved for inherently not codable concepts without codable children: Secondary | ICD-10-CM

## 2014-05-27 DIAGNOSIS — Q068 Other specified congenital malformations of spinal cord: Secondary | ICD-10-CM | POA: Diagnosis not present

## 2014-05-27 NOTE — Patient Instructions (Addendum)
Medicare Annual Wellness Visit  [facility] and the medical providers at [facility] strive to bring you the best medical care.  In doing so we not only want to address your current medical conditions and concerns but also to detect new conditions early and prevent illness, disease and health-related problems.    Medicare offers a yearly Wellness Visit which allows our clinical staff to assess your need for preventative services including immunizations, lifestyle education, counseling to decrease risk of preventable diseases and screening for fall risk and other medical concerns.    This visit is provided free of charge (no copay) for all Medicare recipients. The clinical pharmacists at [facility] have begun to conduct these Wellness Visits which will also include a thorough review of all your medications.    As you primary medical provider recommend that you make an appointment for your Annual Wellness Visit if you have not done so already this year.  You may set up this appointment before you leave today or you may call back ([phone]) and schedule an appointment.  Please make sure when you call that you mention that you are scheduling your Annual Wellness Visit with the clinical pharmacist so that the appointment may be made for the proper length of time.     Continue current medications. Continue good therapeutic lifestyle changes which include good diet and exercise. Fall precautions discussed with patient. If an FOBT was given today- please return it to our front desk. If you are over 83 years old - you may need Prevnar 56 or the adult Pneumonia vaccine.  Continue followup visits with the hematologist We will call you with the lab work results once those results are available

## 2014-05-27 NOTE — Progress Notes (Signed)
Subjective:    Patient ID: Monica Riggs, female    DOB: 03-17-1919, 80 y.o.   MRN: [id number]  HPI Pt here for follow up and management of chronic medical problems. The patient comes to the visit today without any complaints. She is seeing the hematologist on a regular basis. She continues to have low hemoglobin is and myelodysplasia. She continues to get periodic transfusions. According to one of the hematologist last notes she is somewhat neutropenic. She is on hospice but is still getting transfusions . He sees Dr. Lutricia Feil every 6 weeks and usually gets a transfusion shortly after that. He comes to the visit today with her great-grandson. She will have additional lab work done today.        Patient Active Problem List   Diagnosis Date Noted  . Hyperlipemia 04/27/2013  . Hypothyroid 04/27/2013  . Fatigue 04/27/2013  . Vitamin D deficiency 04/27/2013  . Myelodysplasia 01/12/2013   Outpatient Encounter Prescriptions as of 05/27/2014  Medication Sig  . aspirin 325 MG EC tablet Take 325 mg by mouth daily.  Marland Kitchen atorvastatin (LIPITOR) 80 MG tablet TAKE 1/2 TO 1 TABLET DAILY  . BENICAR 40 MG tablet TAKE ONE (1) TABLET EACH DAY  . brimonidine (ALPHAGAN) 0.2 % ophthalmic solution Place 1 drop into both eyes 2 (two) times daily.  . cholecalciferol (VITAMIN D) 1000 UNITS tablet Take 1,000 Units by mouth daily.  Marland Kitchen co-enzyme Q-10 50 MG capsule Take 50 mg by mouth daily.  . Cyanocobalamin (VITAMIN B 12 PO) Take 1,000 mg by mouth every morning.  . diltiazem (CARDIZEM CD) 300 MG 24 hr capsule TAKE ONE (1) CAPSULE EACH DAY  . diltiazem (TIAZAC) 300 MG 24 hr capsule Take 1 capsule (300 mg total) by mouth daily.  . folic acid (FOLVITE) 419 MCG tablet Take 400 mcg by mouth 2 (two) times daily.  . hydrochlorothiazide (HYDRODIURIL) 25 MG tablet TAKE ONE (1) TABLET EACH DAY  . levothyroxine (SYNTHROID) 25 MCG tablet Take 25 mcg by mouth daily before breakfast.  . omega-3 acid ethyl esters (LOVAZA) 1  G capsule Take 2 g by mouth daily.  . Polyethylene Glycol 3350 (MIRALAX PO) Take by mouth as needed.    Review of Systems  Constitutional: Negative.   HENT: Negative.   Eyes: Negative.   Respiratory: Negative.   Cardiovascular: Negative.   Gastrointestinal: Negative.   Endocrine: Negative.   Genitourinary: Negative.   Musculoskeletal: Negative.   Skin: Negative.   Allergic/Immunologic: Negative.   Neurological: Negative.   Hematological: Negative.   Psychiatric/Behavioral: Negative.        Objective:   Physical Exam  Nursing note and vitals reviewed. Constitutional: She is oriented to person, place, and time. She appears well-developed. No distress.  The patient is pale but alert and in some respects looks much younger than her stated age of 87   HENT:  Head: Normocephalic and atraumatic.  Right Ear: External ear normal.  Left Ear: External ear normal.  Nose: Nose normal.  Mouth/Throat: Oropharynx is clear and moist. No oropharyngeal exudate.  Eyes: Conjunctivae and EOM are normal. Pupils are equal, round, and reactive to light. Right eye exhibits no discharge. Left eye exhibits no discharge. No scleral icterus.  Neck: Normal range of motion. Neck supple. No thyromegaly present.  Cardiovascular: Normal rate, normal heart sounds and intact distal pulses.  Exam reveals no gallop and no friction rub.   No murmur heard. The rhythm is irregular irregular at 84 per minute  Pulmonary/Chest: Effort  normal and breath sounds normal. No respiratory distress. She has no wheezes. She has no rales. She exhibits no tenderness.  Abdominal: Soft. Bowel sounds are normal. She exhibits no mass. There is no tenderness. There is no rebound and no guarding.  Musculoskeletal: Normal range of motion. She exhibits no edema.  Lymphadenopathy:    She has no cervical adenopathy.  Neurological: She is alert and oriented to person, place, and time. She has normal reflexes. No cranial nerve deficit.    Skin: Skin is warm and dry. No rash noted. There is pallor.  The skin is very pale  Psychiatric: She has a normal mood and affect. Her behavior is normal. Judgment and thought content normal.   BP 124/59  Pulse 101  Temp(Src) 97 F (36.1 C) (Oral)  Ht $R'5\' 3"'UO$  (1.6 m)  Wt 122 lb (55.339 kg)  BMI 21.62 kg/m2        Assessment & Plan:  1. Hyperlipemia - POCT CBC - BMP8+EGFR - Hepatic function panel - Lipid panel  2. Hypothyroidism, unspecified hypothyroidism type - POCT CBC - Thyroid Panel With TSH  3. Vitamin D deficiency - POCT CBC - Vit D  25 hydroxy (rtn osteoporosis monitoring)  4. Myelodysplasia  5. Other iron deficiency anemias  Patient Instructions                       Medicare Annual Wellness Visit  [facility] and the medical providers at [facility] strive to bring you the best medical care.  In doing so we not only want to address your current medical conditions and concerns but also to detect new conditions early and prevent illness, disease and health-related problems.    Medicare offers a yearly Wellness Visit which allows our clinical staff to assess your need for preventative services including immunizations, lifestyle education, counseling to decrease risk of preventable diseases and screening for fall risk and other medical concerns.    This visit is provided free of charge (no copay) for all Medicare recipients. The clinical pharmacists at [facility] have begun to conduct these Wellness Visits which will also include a thorough review of all your medications.    As you primary medical provider recommend that you make an appointment for your Annual Wellness Visit if you have not done so already this year.  You may set up this appointment before you leave today or you may call back ([phone]) and schedule an appointment.  Please make sure when you call that you mention that you are scheduling your Annual  Wellness Visit with the clinical pharmacist so that the appointment may be made for the proper length of time.     Continue current medications. Continue good therapeutic lifestyle changes which include good diet and exercise. Fall precautions discussed with patient. If an FOBT was given today- please return it to our front desk. If you are over 3 years old - you may need Prevnar 40 or the adult Pneumonia vaccine.  Continue followup visits with the hematologist We will call you with the lab work results once those results are available    Arrie Senate MD

## 2014-05-27 NOTE — Addendum Note (Signed)
Addended by: Pollyann Kennedy F on: 05/27/2014 05:04 PM   Modules accepted: Orders

## 2014-05-28 LAB — CBC WITH DIFFERENTIAL
BASOS ABS: 0.1 10*3/uL (ref 0.0–0.2)
Basos: 2 %
Eos: 4 %
Eosinophils Absolute: 0.1 10*3/uL (ref 0.0–0.4)
HCT: 25.2 % — ABNORMAL LOW (ref 34.0–46.6)
Hemoglobin: 8.6 g/dL — CL (ref 11.1–15.9)
IMMATURE GRANULOCYTES: 0 %
Immature Grans (Abs): 0 10*3/uL (ref 0.0–0.1)
LYMPHS: 22 %
Lymphocytes Absolute: 0.7 10*3/uL (ref 0.7–3.1)
MCH: 32.2 pg (ref 26.6–33.0)
MCHC: 34.1 g/dL (ref 31.5–35.7)
MCV: 94 fL (ref 79–97)
MONOCYTES: 7 %
Monocytes Absolute: 0.2 10*3/uL (ref 0.1–0.9)
Neutrophils Absolute: 2.1 10*3/uL (ref 1.4–7.0)
Neutrophils Relative %: 65 %
PLATELETS: 259 10*3/uL (ref 150–379)
RBC: 2.67 x10E6/uL — CL (ref 3.77–5.28)
RDW: 22.4 % — AB (ref 12.3–15.4)
WBC: 3.3 10*3/uL — AB (ref 3.4–10.8)

## 2014-05-28 LAB — BMP8+EGFR
BUN / CREAT RATIO: 21 (ref 11–26)
BUN: 22 mg/dL (ref 10–36)
CO2: 24 mmol/L (ref 18–29)
CREATININE: 1.05 mg/dL — AB (ref 0.57–1.00)
Calcium: 9.8 mg/dL (ref 8.7–10.3)
Chloride: 90 mmol/L — ABNORMAL LOW (ref 97–108)
GFR calc Af Amer: 53 mL/min/{1.73_m2} — ABNORMAL LOW (ref >59)
GFR, EST NON AFRICAN AMERICAN: 46 mL/min/{1.73_m2} — AB (ref >59)
Glucose: 94 mg/dL (ref 65–99)
POTASSIUM: 4.4 mmol/L (ref 3.5–5.2)
Sodium: 130 mmol/L — ABNORMAL LOW (ref 134–144)

## 2014-05-28 LAB — HEPATIC FUNCTION PANEL
ALK PHOS: 94 IU/L (ref 39–117)
ALT: 44 IU/L — AB (ref 0–32)
AST: 34 IU/L (ref 0–40)
Albumin: 4 g/dL (ref 3.2–4.6)
BILIRUBIN DIRECT: 0.18 mg/dL (ref 0.00–0.40)
BILIRUBIN TOTAL: 0.5 mg/dL (ref 0.0–1.2)
Total Protein: 6.6 g/dL (ref 6.0–8.5)

## 2014-05-28 LAB — LIPID PANEL
CHOLESTEROL TOTAL: 111 mg/dL (ref 100–199)
Chol/HDL Ratio: 1.9 ratio units (ref 0.0–4.4)
HDL: 58 mg/dL (ref >39)
LDL Calculated: 41 mg/dL (ref 0–99)
Triglycerides: 59 mg/dL (ref 0–149)
VLDL Cholesterol Cal: 12 mg/dL (ref 5–40)

## 2014-05-28 LAB — VITAMIN D 25 HYDROXY (VIT D DEFICIENCY, FRACTURES): VIT D 25 HYDROXY: 70.6 ng/mL (ref 30.0–100.0)

## 2014-05-28 LAB — THYROID PANEL WITH TSH
FREE THYROXINE INDEX: 2.4 (ref 1.2–4.9)
T3 UPTAKE RATIO: 31 % (ref 24–39)
T4 TOTAL: 7.6 ug/dL (ref 4.5–12.0)
TSH: 3.87 u[IU]/mL (ref 0.450–4.500)

## 2014-06-02 ENCOUNTER — Ambulatory Visit: Payer: Medicare Other | Admitting: Hematology & Oncology

## 2014-06-02 ENCOUNTER — Other Ambulatory Visit: Payer: Medicare Other | Admitting: Lab

## 2014-06-07 ENCOUNTER — Other Ambulatory Visit: Payer: Self-pay | Admitting: Family Medicine

## 2014-06-09 ENCOUNTER — Ambulatory Visit (HOSPITAL_BASED_OUTPATIENT_CLINIC_OR_DEPARTMENT_OTHER): Payer: Medicare Other | Admitting: Family

## 2014-06-09 ENCOUNTER — Encounter: Payer: Self-pay | Admitting: Family

## 2014-06-09 ENCOUNTER — Other Ambulatory Visit (HOSPITAL_BASED_OUTPATIENT_CLINIC_OR_DEPARTMENT_OTHER): Payer: Medicare Other | Admitting: Lab

## 2014-06-09 VITALS — BP 94/40 | HR 70 | Temp 97.5°F | Resp 14 | Ht 63.0 in | Wt 118.0 lb

## 2014-06-09 DIAGNOSIS — D4620 Refractory anemia with excess of blasts, unspecified: Secondary | ICD-10-CM

## 2014-06-09 DIAGNOSIS — Q068 Other specified congenital malformations of spinal cord: Secondary | ICD-10-CM

## 2014-06-09 DIAGNOSIS — IMO0001 Reserved for inherently not codable concepts without codable children: Secondary | ICD-10-CM

## 2014-06-09 DIAGNOSIS — D469 Myelodysplastic syndrome, unspecified: Principal | ICD-10-CM

## 2014-06-09 LAB — CBC WITH DIFFERENTIAL (CANCER CENTER ONLY)
HCT: 22.1 % — ABNORMAL LOW (ref 34.8–46.6)
HGB: 7.6 g/dL — ABNORMAL LOW (ref 11.6–15.9)
MCH: 33.9 pg (ref 26.0–34.0)
MCHC: 34.4 g/dL (ref 32.0–36.0)
MCV: 99 fL (ref 81–101)
Platelets: 248 10*3/uL (ref 145–400)
RBC: 2.24 10*6/uL — ABNORMAL LOW (ref 3.70–5.32)
RDW: 22.2 % — AB (ref 11.1–15.7)
WBC: 2.6 10*3/uL — ABNORMAL LOW (ref 3.9–10.0)

## 2014-06-09 LAB — MANUAL DIFFERENTIAL (CHCC SATELLITE)
ALC: 0.7 10*3/uL (ref 0.6–2.2)
ANC (CHCC HP manual diff): 1.6 10*3/uL (ref 1.5–6.7)
BASO: 2 % (ref 0–2)
Band Neutrophils: 1 % (ref 0–10)
EOS: 6 % (ref 0–7)
LYMPH: 26 % (ref 14–48)
MONO: 6 % (ref 0–13)
Myelocytes: 1 % — ABNORMAL HIGH (ref 0–0)
PLT EST ~~LOC~~: ADEQUATE
SEG: 58 % (ref 40–75)

## 2014-06-09 LAB — IRON AND TIBC CHCC
%SAT: >100 % (ref 21–?)
Iron: 186 ug/dL — ABNORMAL HIGH (ref 41–142)
TIBC: 180 ug/dL — AB (ref 236–444)
UIBC: <1 ug/dL (ref 120–384)

## 2014-06-09 LAB — RETICULOCYTES (CHCC)
ABS RETIC: 37.4 10*3/uL (ref 19.0–186.0)
RBC.: 2.34 MIL/uL — ABNORMAL LOW (ref 3.87–5.11)
Retic Ct Pct: 1.6 % (ref 0.4–2.3)

## 2014-06-09 LAB — FERRITIN CHCC: Ferritin: 1786 ng/ml — ABNORMAL HIGH (ref 9–269)

## 2014-06-09 LAB — HOLD TUBE, BLOOD BANK - CHCC SATELLITE

## 2014-06-09 LAB — CHCC SATELLITE - SMEAR

## 2014-06-09 NOTE — Progress Notes (Signed)
[place]  Telephone:(336) [phone] [phone]) [phone]  ID: Monica Riggs OB: [birth year] MR#: [id number]#:[id number] Patient Care Team: [name], MD as PCP - General (Family Medicine) Helayne Seminole, PA-C as Physician Assistant (Hematology and Oncology)  DIAGNOSIS: Refractory anemia  INTERVAL HISTORY: Monica Riggs is here today for a follow-up. She is now be seen by hospice at home and they do well with her. She is feeling very tired, dizzy and doesn't have an appetite. She has lost 4 lbs since her last visit. She drinks 1-2 Boost a day. Her BP is 90/40 with a HR of 70. She is still dizzy sitting in her chair. She has multiple health problems and is on a lot of medication. Her last transfusion was in July. Her Hgb today is 7.6. She has SOB at times. She uses Mirilax to help with her constipation. She has had no pain or bleeding. She denies fever, shills, n/v, cough, rash, headache, dizziness, chest pain, palpitations, abdominal pain, diarrhea, blood in urine or stool. She denies swelling, tenderness, numbness or tingling in her extremities.   CURRENT TREATMENT: Supportive care with transfusions  [facility]  REVIEW OF SYSTEMS: All other 10 point review of systems is negative except for those issues mentioned above.   PAST MEDICAL HISTORY: Past Medical History  Diagnosis Date  . Hyperlipidemia   . Hypertension   . Myelodysplasia   . Arthritis    PAST SURGICAL HISTORY: No past surgical history on file.  FAMILY HISTORY No family history on file.  GYNECOLOGIC HISTORY:  No LMP recorded. Patient is postmenopausal.   SOCIAL HISTORY:  History   Social History  . Marital Status: Widowed    Spouse Name: N/A    Number of Children: N/A  . Years of Education: N/A   Occupational History  . Not on file.   Social History Main Topics  . Smoking status: Never Smoker   . Smokeless tobacco: Never Used     Comment: never used tobacco  . Alcohol Use: No  .  Drug Use: No  . Sexual Activity: Not on file   Other Topics Concern  . Not on file   Social History Narrative  . No narrative on file   ADVANCED DIRECTIVES: <no information>  HEALTH MAINTENANCE: History  Substance Use Topics  . Smoking status: Never Smoker   . Smokeless tobacco: Never Used     Comment: never used tobacco  . Alcohol Use: No   Colonoscopy: PAP: Bone density: Lipid panel:  Allergies  Allergen Reactions  . Sulfa Antibiotics Rash   Current Outpatient Prescriptions  Medication Sig Dispense Refill  . aspirin 325 MG EC tablet Take 325 mg by mouth daily.      . brimonidine (ALPHAGAN) 0.2 % ophthalmic solution Place 1 drop into both eyes 2 (two) times daily.      . cholecalciferol (VITAMIN D) 1000 UNITS tablet Take 1,000 Units by mouth daily.      Marland Kitchen co-enzyme Q-10 50 MG capsule Take 50 mg by mouth daily.      . Cyanocobalamin (VITAMIN B 12 PO) Take 1,000 mg by mouth every morning.      . diltiazem (CARDIZEM CD) 300 MG 24 hr capsule TAKE ONE (1) CAPSULE EACH DAY  30 capsule  2  . folic acid (FOLVITE) 431 MCG tablet Take 400 mcg by mouth 2 (two) times daily.      Marland Kitchen levothyroxine (SYNTHROID) 25 MCG tablet Take 25 mcg by mouth daily before breakfast.      .  Polyethylene Glycol 3350 (MIRALAX PO) Take by mouth as needed.       No current facility-administered medications for this visit.   OBJECTIVE: Filed Vitals:   06/09/14 0954  BP: 94/40  Pulse: 70  Temp: 97.5 F (36.4 C)  Resp: 14   Body mass index is 20.91 kg/(m^2). ECOG FS:1 - Symptomatic but completely ambulatory Ocular: Sclerae unicteric, pupils equal, round and reactive to light Ear-nose-throat: Oropharynx clear, dentition fair Lymphatic: No cervical or supraclavicular adenopathy Lungs no rales or rhonchi, good excursion bilaterally Heart regular rate and rhythm, no murmur appreciated Abd soft, nontender, positive bowel sounds MSK no focal spinal tenderness, no joint edema Neuro: non-focal,  well-oriented, appropriate affect Breasts: Deferred ROS Physical Exam  LAB RESULTS: CMP     Component Value Date/Time   NA 130* 05/27/2014 1624   NA 139 04/27/2013 1616   K 4.4 05/27/2014 1624   CL 90* 05/27/2014 1624   CO2 24 05/27/2014 1624   GLUCOSE 94 05/27/2014 1624   GLUCOSE 111* 04/27/2013 1616   BUN 22 05/27/2014 1624   BUN 18 04/27/2013 1616   CREATININE 1.05* 05/27/2014 1624   CREATININE 1.02 04/27/2013 1616   CALCIUM 9.8 05/27/2014 1624   PROT 6.6 05/27/2014 1624   PROT 6.4 04/27/2013 1616   ALBUMIN 4.1 04/27/2013 1616   AST 34 05/27/2014 1624   ALT 44* 05/27/2014 1624   ALKPHOS 94 05/27/2014 1624   BILITOT 0.5 05/27/2014 1624   GFRNONAA 46* 05/27/2014 1624   GFRNONAA 48* 04/27/2013 1616   GFRAA 53* 05/27/2014 1624   GFRAA 55* 04/27/2013 1616   No results found for this basename: SPEP, UPEP,  kappa and lambda light chains   Lab Results  Component Value Date   WBC 2.6* 06/09/2014   NEUTROABS 2.1 05/27/2014   HGB 7.6* 06/09/2014   HCT 22.1* 06/09/2014   MCV 99 06/09/2014   PLT 248 06/09/2014   No results found for this basename: LABCA2   No components found with this basename: LABCA125   No results found for this basename: INR,  in the last 168 hours Urinalysis No results found for this basename: colorurine, appearanceur, labspec, phurine, glucoseu, hgbur, bilirubinur, ketonesur, proteinur, urobilinogen, nitrite, leukocytesur   STUDIES: No results found.  [name] is a 80 year old white female with myelodysplasia. Her main issue today is fatigue and hypotension. After discussing her med list with Dr. Marin Olp we have agreed to stop her Lipitor, Benicar, Tiazac and Hydrodiuril. Hopefully. this will help her feel better.  Her WBC is staying steady at 2.6. Her Hgb today is 7.6. We will wait and see what the rest of her labs show.  We will see her back on 2 weeks for labs and a follow-up. We will see if she needs transfused at that time.  She is in agreement with  this and knows to call here with any questions or concerns. We can certainly see her back sooner if need be.   Eliezer Bottom, NP 06/09/2014 10:28 AM

## 2014-06-10 ENCOUNTER — Other Ambulatory Visit: Payer: Self-pay | Admitting: Family

## 2014-06-11 NOTE — Telephone Encounter (Signed)
Last seen 05/27/14  DW[facility]  Atorvastatin not on EPIC med list

## 2014-06-18 ENCOUNTER — Ambulatory Visit (HOSPITAL_COMMUNITY)
Admission: RE | Admit: 2014-06-18 | Discharge: 2014-06-18 | Disposition: A | Discharge status: Home / Self Care | Payer: Medicare Other | Source: Ambulatory Visit | Attending: Hematology & Oncology | Admitting: Hematology & Oncology

## 2014-06-18 ENCOUNTER — Other Ambulatory Visit (HOSPITAL_BASED_OUTPATIENT_CLINIC_OR_DEPARTMENT_OTHER): Payer: Medicare Other | Admitting: Lab

## 2014-06-18 ENCOUNTER — Encounter: Payer: Self-pay | Admitting: Family

## 2014-06-18 ENCOUNTER — Ambulatory Visit (HOSPITAL_BASED_OUTPATIENT_CLINIC_OR_DEPARTMENT_OTHER): Payer: Medicare Other | Admitting: Family

## 2014-06-18 VITALS — BP 106/63 | HR 100 | Temp 97.6°F | Resp 16 | Ht 62.0 in | Wt 118.0 lb

## 2014-06-18 DIAGNOSIS — D469 Myelodysplastic syndrome, unspecified: Secondary | ICD-10-CM

## 2014-06-18 DIAGNOSIS — D46Z Other myelodysplastic syndromes: Secondary | ICD-10-CM

## 2014-06-18 DIAGNOSIS — IMO0001 Reserved for inherently not codable concepts without codable children: Secondary | ICD-10-CM

## 2014-06-18 LAB — RETICULOCYTES (CHCC)
ABS Retic: 28.8 10*3/uL (ref 19.0–186.0)
RBC.: 2.06 MIL/uL — ABNORMAL LOW (ref 3.87–5.11)
Retic Ct Pct: 1.4 % (ref 0.4–2.3)

## 2014-06-18 LAB — MANUAL DIFFERENTIAL (CHCC SATELLITE)
ALC: 0.6 10*3/uL (ref 0.6–2.2)
ANC (CHCC HP manual diff): 1.7 10*3/uL (ref 1.5–6.7)
BASO: 4 % — ABNORMAL HIGH (ref 0–2)
Band Neutrophils: 2 % (ref 0–10)
Blasts: 1 % — ABNORMAL HIGH (ref 0–0)
EOS: 7 % (ref 0–7)
LYMPH: 21 % (ref 14–48)
MONO: 2 % (ref 0–13)
MYELOCYTES: 1 % — AB (ref 0–0)
Metamyelocytes: 2 % — ABNORMAL HIGH (ref 0–0)
OTHER COMMENTS: 0
Other Cells: 0 % (ref 0–0)
PLT EST ~~LOC~~: ADEQUATE
PROMYELO: 0 % (ref 0–0)
SEG: 60 % (ref 40–75)
Variant Lymph: 0 % (ref 0–0)
nRBC: 0 % (ref 0–0)

## 2014-06-18 LAB — IRON AND TIBC CHCC
IRON: 175 ug/dL — AB (ref 41–142)
TIBC: 166 ug/dL — AB (ref 236–444)

## 2014-06-18 LAB — CBC WITH DIFFERENTIAL (CANCER CENTER ONLY)
HEMATOCRIT: 20.3 % — AB (ref 34.8–46.6)
HGB: 6.8 g/dL — CL (ref 11.6–15.9)
MCH: 33.8 pg (ref 26.0–34.0)
MCHC: 33.5 g/dL (ref 32.0–36.0)
MCV: 101 fL (ref 81–101)
Platelets: 300 10*3/uL (ref 145–400)
RBC: 2.01 10*6/uL — AB (ref 3.70–5.32)
RDW: 23.8 % — ABNORMAL HIGH (ref 11.1–15.7)
WBC: 2.6 10*3/uL — ABNORMAL LOW (ref 3.9–10.0)

## 2014-06-18 LAB — FERRITIN CHCC: Ferritin: 1275 ng/ml — ABNORMAL HIGH (ref 9–269)

## 2014-06-18 LAB — HOLD TUBE, BLOOD BANK - CHCC SATELLITE

## 2014-06-18 NOTE — Progress Notes (Signed)
Monica Riggs  Telephone:(336) [phone] Fax:(336) [phone]  ID: Monica Riggs OB: 1919/10/14 MR#: [id number]#:[id number] Patient Care Team: [name], MD as PCP - General (Family Medicine) Helayne Seminole, PA-C as Physician Assistant (Hematology and Oncology)  DIAGNOSIS: Refractory anemia  INTERVAL HISTORY: Monica Riggs is here today for a follow-up. She is feeling much better today. She has stopped some of the medications that we recommend she come off of last week. Her family is here with her today and say they will follow up and make sure she stops the others. Her weight is staying stable. Her BP today is better. However, her Hgb is 6.8. She was last tranfused in July. She is Her dizziness has improved but she does become SOB with exertion. She has also decided that she does not want hospice right now. It is time for her re-evaluation with them and she has chosen to hold off for now. She uses Mirilax to help with her constipation. She has had no pain or bleeding. She denies fever, shills, n/v, cough, rash, headache, dizziness, chest pain, palpitations, abdominal pain, diarrhea, blood in urine or stool. She denies swelling, tenderness, numbness or tingling in her extremities.   CURRENT TREATMENT: Supportive care with transfusions  [facility]  REVIEW OF SYSTEMS: All other 10 point review of systems is negative except for those issues mentioned above.   PAST MEDICAL HISTORY: Past Medical History  Diagnosis Date  . Hyperlipidemia   . Hypertension   . Myelodysplasia   . Arthritis    PAST SURGICAL HISTORY: No past surgical history on file.  FAMILY HISTORY No family history on file.  GYNECOLOGIC HISTORY:  No LMP recorded. Patient is postmenopausal.   SOCIAL HISTORY:  History   Social History  . Marital Status: Widowed    Spouse Name: N/A    Number of Children: N/A  . Years of Education: N/A   Occupational History  . Not on file.   Social History  Main Topics  . Smoking status: Never Smoker   . Smokeless tobacco: Never Used     Comment: never used tobacco  . Alcohol Use: No  . Drug Use: No  . Sexual Activity: Not on file   Other Topics Concern  . Not on file   Social History Narrative  . No narrative on file   ADVANCED DIRECTIVES: <no information>  HEALTH MAINTENANCE: History  Substance Use Topics  . Smoking status: Never Smoker   . Smokeless tobacco: Never Used     Comment: never used tobacco  . Alcohol Use: No   Colonoscopy: PAP: Bone density: Lipid panel:  Allergies  Allergen Reactions  . Sulfa Antibiotics Rash   Current Outpatient Prescriptions  Medication Sig Dispense Refill  . aspirin 325 MG EC tablet Take 325 mg by mouth daily.      . brimonidine (ALPHAGAN) 0.2 % ophthalmic solution Place 1 drop into both eyes 2 (two) times daily.      . cholecalciferol (VITAMIN D) 1000 UNITS tablet Take 1,000 Units by mouth daily.      . Cyanocobalamin (VITAMIN B 12 PO) Take 1,000 mg by mouth every morning.      . folic acid (FOLVITE) 456 MCG tablet Take 400 mcg by mouth 2 (two) times daily.      Marland Kitchen levothyroxine (SYNTHROID, LEVOTHROID) 50 MCG tablet TAKE ONE (1) TABLET EACH DAY  30 tablet  11  . Polyethylene Glycol 3350 (MIRALAX PO) Take by mouth as needed.  No current facility-administered medications for this visit.   OBJECTIVE: Filed Vitals:   06/18/14 0900  BP: 106/63  Pulse: 100  Temp: 97.6 F (36.4 C)  Resp: 16   Body mass index is 21.58 kg/(m^2). ECOG FS:1 - Symptomatic but completely ambulatory Ocular: Sclerae unicteric, pupils equal, round and reactive to light Ear-nose-throat: Oropharynx clear, dentition fair Lymphatic: No cervical or supraclavicular adenopathy Lungs no rales or rhonchi, good excursion bilaterally Heart regular rate and rhythm, no murmur appreciated Abd soft, nontender, positive bowel sounds MSK no focal spinal tenderness, no joint edema Neuro: non-focal, well-oriented,  appropriate affect Breasts: Deferred ROS Physical Exam  LAB RESULTS: CMP     Component Value Date/Time   NA 130* 05/27/2014 1624   NA 139 04/27/2013 1616   K 4.4 05/27/2014 1624   CL 90* 05/27/2014 1624   CO2 24 05/27/2014 1624   GLUCOSE 94 05/27/2014 1624   GLUCOSE 111* 04/27/2013 1616   BUN 22 05/27/2014 1624   BUN 18 04/27/2013 1616   CREATININE 1.05* 05/27/2014 1624   CREATININE 1.02 04/27/2013 1616   CALCIUM 9.8 05/27/2014 1624   PROT 6.6 05/27/2014 1624   PROT 6.4 04/27/2013 1616   ALBUMIN 4.1 04/27/2013 1616   AST 34 05/27/2014 1624   ALT 44* 05/27/2014 1624   ALKPHOS 94 05/27/2014 1624   BILITOT 0.5 05/27/2014 1624   GFRNONAA 46* 05/27/2014 1624   GFRNONAA 48* 04/27/2013 1616   GFRAA 53* 05/27/2014 1624   GFRAA 55* 04/27/2013 1616   No results found for this basename: SPEP,  UPEP,   kappa and lambda light chains   Lab Results  Component Value Date   WBC 2.6* 06/18/2014   NEUTROABS 2.1 05/27/2014   HGB 6.8* 06/18/2014   HCT 20.3* 06/18/2014   MCV 101 06/18/2014   PLT 300 06/18/2014   No results found for this basename: LABCA2   No components found with this basename: LABCA125   No results found for this basename: INR,  in the last 168 hours Urinalysis No results found for this basename: colorurine,  appearanceur,  labspec,  phurine,  glucoseu,  hgbur,  bilirubinur,  ketonesur,  proteinur,  urobilinogen,  nitrite,  leukocytesur   STUDIES: No results found.  [name] is a 80 year old white female with myelodysplasia. She is feeling much better today. She is still working on quitting the medications we talked about last week. She says she is"stuck in her ways" but is working on it. Her Hgb is 6.8 and she does become SOB with exertion.  We will have her come back on September 15th for follow-up and labs, including a type and cross.  We will transfuse 2 units PRBCs on September 16.  Her WBC has stayed at 2.6. We will wait and see what her iron studies show.  She is in  agreement with this and knows to call here with any questions or concerns. We can certainly see her back sooner if need be.   Eliezer Bottom, NP 06/18/2014 9:46 AM

## 2014-06-29 ENCOUNTER — Other Ambulatory Visit (HOSPITAL_BASED_OUTPATIENT_CLINIC_OR_DEPARTMENT_OTHER): Payer: Medicare Other | Admitting: Lab

## 2014-06-29 ENCOUNTER — Encounter: Payer: Self-pay | Admitting: Family

## 2014-06-29 ENCOUNTER — Ambulatory Visit (HOSPITAL_BASED_OUTPATIENT_CLINIC_OR_DEPARTMENT_OTHER): Payer: Medicare Other | Admitting: Family

## 2014-06-29 ENCOUNTER — Other Ambulatory Visit: Payer: Self-pay | Admitting: Family

## 2014-06-29 ENCOUNTER — Other Ambulatory Visit: Payer: Self-pay | Admitting: Hematology & Oncology

## 2014-06-29 VITALS — BP 111/54 | HR 84 | Temp 97.6°F | Resp 14 | Ht 62.0 in | Wt 118.0 lb

## 2014-06-29 DIAGNOSIS — D46Z Other myelodysplastic syndromes: Secondary | ICD-10-CM

## 2014-06-29 DIAGNOSIS — IMO0001 Reserved for inherently not codable concepts without codable children: Secondary | ICD-10-CM

## 2014-06-29 DIAGNOSIS — D469 Myelodysplastic syndrome, unspecified: Secondary | ICD-10-CM

## 2014-06-29 DIAGNOSIS — R0602 Shortness of breath: Secondary | ICD-10-CM

## 2014-06-29 LAB — IRON AND TIBC CHCC
Iron: 182 ug/dL — ABNORMAL HIGH (ref 41–142)
TIBC: 175 ug/dL — AB (ref 236–444)

## 2014-06-29 LAB — RETICULOCYTES (CHCC)
ABS Retic: 34.6 10*3/uL (ref 19.0–186.0)
RBC.: 1.82 MIL/uL — ABNORMAL LOW (ref 3.87–5.11)
Retic Ct Pct: 1.9 % (ref 0.4–2.3)

## 2014-06-29 LAB — CBC WITH DIFFERENTIAL (CANCER CENTER ONLY)
HCT: 18.4 % — ABNORMAL LOW (ref 34.8–46.6)
HEMOGLOBIN: 6.2 g/dL — AB (ref 11.6–15.9)
MCH: 35.4 pg — AB (ref 26.0–34.0)
MCHC: 33.7 g/dL (ref 32.0–36.0)
MCV: 105 fL — ABNORMAL HIGH (ref 81–101)
Platelets: 227 10*3/uL (ref 145–400)
RBC: 1.75 10*6/uL — ABNORMAL LOW (ref 3.70–5.32)
WBC: 2.2 10*3/uL — ABNORMAL LOW (ref 3.9–10.0)

## 2014-06-29 LAB — HOLD TUBE, BLOOD BANK - CHCC SATELLITE

## 2014-06-29 LAB — MANUAL DIFFERENTIAL (CHCC SATELLITE)
ALC: 0.6 10*3/uL (ref 0.6–2.2)
ANC (CHCC MAN DIFF): 1.3 10*3/uL — AB (ref 1.5–6.7)
BAND NEUTROPHILS: 4 % (ref 0–10)
BASO: 2 % (ref 0–2)
Eos: 8 % — ABNORMAL HIGH (ref 0–7)
LYMPH: 28 % (ref 14–48)
MONO: 3 % (ref 0–13)
PLT EST ~~LOC~~: ADEQUATE
SEG: 55 % (ref 40–75)

## 2014-06-29 LAB — FERRITIN CHCC: Ferritin: 1877 ng/ml — ABNORMAL HIGH (ref 9–269)

## 2014-06-29 NOTE — Progress Notes (Signed)
[place]  Telephone:(336) [phone] Fax:(336) [phone]  ID: Bertram Millard OB: [birth year] MR#: [id number] CSN#:[id number] Patient Care Team: [name], MD as PCP - General (Family Medicine) Helayne Seminole, PA-C as Physician Assistant (Hematology and Oncology)  DIAGNOSIS: Refractory anemia  INTERVAL HISTORY: Monica Riggs is here today for labs and follow-up. She is feeling good but has had some SOB with exertion. Her weight is staying stable. Her BP continues to improve. Today her Hgb is 6.2. She was last tranfused in July. She uses Mirilax to help with her constipation. She has had no pain or bleeding. She denies fever, shills, n/v, cough, rash, headache, dizziness, chest pain, palpitations, abdominal pain, diarrhea, blood in urine or stool. She denies swelling, tenderness, numbness or tingling in her extremities. Her appetite is ok and she is drinking plenty of fluids. Her weight is stable.  CURRENT TREATMENT: Supportive care with transfusions   REVIEW OF SYSTEMS: All other 10 point review of systems is negative except for those issues mentioned above.   PAST MEDICAL HISTORY: Past Medical History  Diagnosis Date  . Hyperlipidemia   . Hypertension   . Myelodysplasia   . Arthritis    PAST SURGICAL HISTORY: No past surgical history on file.  FAMILY HISTORY No family history on file.  GYNECOLOGIC HISTORY:  No LMP recorded. Patient is postmenopausal.   SOCIAL HISTORY:  History   Social History  . Marital Status: Widowed    Spouse Name: N/A    Number of Children: N/A  . Years of Education: N/A   Occupational History  . Not on file.   Social History Main Topics  . Smoking status: Never Smoker   . Smokeless tobacco: Never Used     Comment: never used tobacco  . Alcohol Use: No  . Drug Use: No  . Sexual Activity: Not on file   Other Topics Concern  . Not on file   Social History Narrative  . No narrative on file   ADVANCED DIRECTIVES: <no  information>  HEALTH MAINTENANCE: History  Substance Use Topics  . Smoking status: Never Smoker   . Smokeless tobacco: Never Used     Comment: never used tobacco  . Alcohol Use: No   Colonoscopy: PAP: Bone density: Lipid panel:  Allergies  Allergen Reactions  . Sulfa Antibiotics Rash   Current Outpatient Prescriptions  Medication Sig Dispense Refill  . aspirin 325 MG EC tablet Take 325 mg by mouth daily.      . brimonidine (ALPHAGAN) 0.2 % ophthalmic solution Place 1 drop into both eyes 2 (two) times daily.      . cholecalciferol (VITAMIN D) 1000 UNITS tablet Take 1,000 Units by mouth daily.      . Cyanocobalamin (VITAMIN B 12 PO) Take 1,000 mg by mouth every morning.      . folic acid (FOLVITE) 465 MCG tablet Take 400 mcg by mouth 2 (two) times daily.      Marland Kitchen levothyroxine (SYNTHROID, LEVOTHROID) 50 MCG tablet TAKE ONE (1) TABLET EACH DAY  30 tablet  11  . Polyethylene Glycol 3350 (MIRALAX PO) Take by mouth as needed.       No current facility-administered medications for this visit.   OBJECTIVE: Filed Vitals:   06/29/14 1150  BP: 111/54  Pulse: 84  Temp: 97.6 F (36.4 C)  Resp: 14   Body mass index is 21.58 kg/(m^2). ECOG FS:1 - Symptomatic but completely ambulatory Ocular: Sclerae unicteric, pupils equal, round and reactive to light Ear-nose-throat: Oropharynx  clear, dentition fair Lymphatic: No cervical or supraclavicular adenopathy Lungs no rales or rhonchi, good excursion bilaterally Heart regular rate and rhythm, no murmur appreciated Abd soft, nontender, positive bowel sounds MSK no focal spinal tenderness, no joint edema Neuro: non-focal, well-oriented, appropriate affect Breasts: Deferred ROS Physical Exam  LAB RESULTS: CMP     Component Value Date/Time   NA 130* 05/27/2014 1624   NA 139 04/27/2013 1616   K 4.4 05/27/2014 1624   CL 90* 05/27/2014 1624   CO2 24 05/27/2014 1624   GLUCOSE 94 05/27/2014 1624   GLUCOSE 111* 04/27/2013 1616   BUN 22  05/27/2014 1624   BUN 18 04/27/2013 1616   CREATININE 1.05* 05/27/2014 1624   CREATININE 1.02 04/27/2013 1616   CALCIUM 9.8 05/27/2014 1624   PROT 6.6 05/27/2014 1624   PROT 6.4 04/27/2013 1616   ALBUMIN 4.1 04/27/2013 1616   AST 34 05/27/2014 1624   ALT 44* 05/27/2014 1624   ALKPHOS 94 05/27/2014 1624   BILITOT 0.5 05/27/2014 1624   GFRNONAA 46* 05/27/2014 1624   GFRNONAA 48* 04/27/2013 1616   GFRAA 53* 05/27/2014 1624   GFRAA 55* 04/27/2013 1616   No results found for this basename: SPEP,  UPEP,   kappa and lambda light chains   Lab Results  Component Value Date   WBC 2.2* 06/29/2014   NEUTROABS 2.1 05/27/2014   HGB 6.2* 06/29/2014   HCT 18.4* 06/29/2014   MCV 105* 06/29/2014   PLT 227 06/29/2014   No results found for this basename: LABCA2   No components found with this basename: LABCA125   No results found for this basename: INR,  in the last 168 hours Urinalysis No results found for this basename: colorurine,  appearanceur,  labspec,  phurine,  glucoseu,  hgbur,  bilirubinur,  ketonesur,  proteinur,  urobilinogen,  nitrite,  leukocytesur   STUDIES: No results found.  ASSESSMENT/PLAN: [name] is a 80 year old white female with myelodysplasia. She is feeling good today. Her Hgb today is 6.2 and she does become SOB with exertion.   We will transfuse 2 units PRBCs tomorrow.  Her WBC is 2.2. We will wait and see what her iron studies show.  We will see her back for follow-up and labs in 6 weeks.  She knows to call here with any questions or concerns and to go to the ED in the event of an emergency. We can certainly see her sooner if need be.  Eliezer Bottom, NP 06/29/2014 1:10 PM

## 2014-06-30 ENCOUNTER — Ambulatory Visit (HOSPITAL_BASED_OUTPATIENT_CLINIC_OR_DEPARTMENT_OTHER): Payer: Medicare Other

## 2014-06-30 VITALS — BP 112/58 | HR 79 | Temp 97.8°F | Resp 16

## 2014-06-30 DIAGNOSIS — D46Z Other myelodysplastic syndromes: Secondary | ICD-10-CM | POA: Diagnosis not present

## 2014-06-30 DIAGNOSIS — IMO0001 Reserved for inherently not codable concepts without codable children: Secondary | ICD-10-CM

## 2014-06-30 DIAGNOSIS — D469 Myelodysplastic syndrome, unspecified: Secondary | ICD-10-CM

## 2014-06-30 LAB — PREPARE RBC (CROSSMATCH)

## 2014-06-30 MED ORDER — INFLUENZA VAC SPLIT QUAD 0.5 ML IM SUSY
0.5000 mL | PREFILLED_SYRINGE | Freq: Once | INTRAMUSCULAR | Status: DC
  Filled 2014-06-30: qty 0.5

## 2014-06-30 MED ORDER — SODIUM CHLORIDE 0.9 % IV SOLN
250.0000 mL | Freq: Once | INTRAVENOUS | Status: AC
  Administered 2014-06-30: 250 mL via INTRAVENOUS

## 2014-06-30 MED ORDER — DIPHENHYDRAMINE HCL 25 MG PO CAPS
ORAL_CAPSULE | ORAL | Status: AC
  Filled 2014-06-30: qty 1

## 2014-06-30 MED ORDER — ACETAMINOPHEN 325 MG PO TABS
650.0000 mg | ORAL_TABLET | Freq: Once | ORAL | Status: AC
  Administered 2014-06-30: 650 mg via ORAL

## 2014-06-30 MED ORDER — ACETAMINOPHEN 325 MG PO TABS
ORAL_TABLET | ORAL | Status: AC
  Filled 2014-06-30: qty 2

## 2014-06-30 MED ORDER — DIPHENHYDRAMINE HCL 25 MG PO CAPS
25.0000 mg | ORAL_CAPSULE | Freq: Once | ORAL | Status: AC
  Administered 2014-06-30: 25 mg via ORAL

## 2014-06-30 NOTE — Patient Instructions (Signed)

## 2014-07-01 LAB — TYPE AND SCREEN
ABO/RH(D): O NEG
Antibody Screen: NEGATIVE
UNIT DIVISION: 0
Unit division: 0

## 2014-07-07 ENCOUNTER — Other Ambulatory Visit: Payer: Self-pay | Admitting: Family Medicine

## 2014-07-08 ENCOUNTER — Encounter: Payer: Self-pay | Admitting: Hematology & Oncology

## 2014-07-12 ENCOUNTER — Other Ambulatory Visit: Payer: Self-pay | Admitting: Family Medicine

## 2014-08-10 ENCOUNTER — Encounter: Payer: Self-pay | Admitting: Hematology & Oncology

## 2014-08-10 ENCOUNTER — Ambulatory Visit (HOSPITAL_COMMUNITY)
Admission: RE | Admit: 2014-08-10 | Discharge: 2014-08-10 | Disposition: A | Discharge status: Home / Self Care | Payer: Medicare Other | Source: Ambulatory Visit | Attending: Hematology & Oncology | Admitting: Hematology & Oncology

## 2014-08-10 ENCOUNTER — Other Ambulatory Visit (HOSPITAL_BASED_OUTPATIENT_CLINIC_OR_DEPARTMENT_OTHER): Payer: Medicare Other | Admitting: Lab

## 2014-08-10 ENCOUNTER — Ambulatory Visit: Payer: Medicare Other

## 2014-08-10 ENCOUNTER — Telehealth: Payer: Self-pay | Admitting: *Deleted

## 2014-08-10 ENCOUNTER — Ambulatory Visit (HOSPITAL_BASED_OUTPATIENT_CLINIC_OR_DEPARTMENT_OTHER): Payer: Medicare Other | Admitting: Hematology & Oncology

## 2014-08-10 VITALS — BP 112/50 | HR 65 | Temp 97.6°F | Resp 14 | Ht 62.0 in | Wt 120.0 lb

## 2014-08-10 DIAGNOSIS — D469 Myelodysplastic syndrome, unspecified: Secondary | ICD-10-CM

## 2014-08-10 DIAGNOSIS — IMO0001 Reserved for inherently not codable concepts without codable children: Secondary | ICD-10-CM

## 2014-08-10 DIAGNOSIS — E559 Vitamin D deficiency, unspecified: Secondary | ICD-10-CM

## 2014-08-10 LAB — HOLD TUBE, BLOOD BANK - CHCC SATELLITE

## 2014-08-10 LAB — MANUAL DIFFERENTIAL (CHCC SATELLITE)
ALC: 0.8 10*3/uL (ref 0.6–2.2)
ANC (CHCC HP manual diff): 1.7 10*3/uL (ref 1.5–6.7)
BAND NEUTROPHILS: 2 % (ref 0–10)
BASO: 1 % (ref 0–2)
Eos: 3 % (ref 0–7)
LYMPH: 30 % (ref 14–48)
MONO: 4 % (ref 0–13)
PLT EST ~~LOC~~: ADEQUATE
SEG: 60 % (ref 40–75)

## 2014-08-10 LAB — RETICULOCYTES (CHCC)
ABS RETIC: 32.5 10*3/uL (ref 19.0–186.0)
RBC.: 1.91 MIL/uL — ABNORMAL LOW (ref 3.87–5.11)
Retic Ct Pct: 1.7 % (ref 0.4–2.3)

## 2014-08-10 LAB — CBC WITH DIFFERENTIAL (CANCER CENTER ONLY)
HCT: 19.1 % — ABNORMAL LOW (ref 34.8–46.6)
HGB: 6.3 g/dL — CL (ref 11.6–15.9)
MCH: 33.7 pg (ref 26.0–34.0)
MCHC: 33 g/dL (ref 32.0–36.0)
MCV: 102 fL — ABNORMAL HIGH (ref 81–101)
Platelets: 262 10*3/uL (ref 145–400)
RBC: 1.87 10*6/uL — AB (ref 3.70–5.32)
WBC: 2.8 10*3/uL — ABNORMAL LOW (ref 3.9–10.0)

## 2014-08-10 LAB — IRON AND TIBC CHCC
%SAT: UNDETERMINED % (ref 21–57)
Iron: 170 ug/dL — ABNORMAL HIGH (ref 41–142)
TIBC: 169 ug/dL — ABNORMAL LOW (ref 236–444)
UIBC: UNDETERMINED ug/dL (ref 120–384)

## 2014-08-10 LAB — FERRITIN CHCC: Ferritin: 1981 ng/ml — ABNORMAL HIGH (ref 9–269)

## 2014-08-10 MED ORDER — INFLUENZA VAC SPLIT QUAD 0.5 ML IM SUSY
0.5000 mL | PREFILLED_SYRINGE | Freq: Once | INTRAMUSCULAR | Status: DC
  Filled 2014-08-10: qty 0.5

## 2014-08-10 NOTE — Progress Notes (Signed)
Hematology and Oncology Follow Up Visit  Monica Riggs [id number] 1919-03-11 80 y.o. 08/10/2014   Principle Diagnosis:  Refractory anemia  Current Therapy:   Supportive care with transfusions      Interim History:  Ms.  Riggs is back for followup. Is doing okay. She is not be followed by hospice and longer. She really does not want hospice at this point.   She's had no bleeding. Her appetite been okay. She's not lost any  weight.   . She's had no cough. There's been no shortness of breath.   Her overall, her performance status is ECOG 3.  Medications: Current outpatient prescriptions:aspirin 325 MG EC tablet, Take 325 mg by mouth daily., Disp: , Rfl: ;  BENICAR 40 MG tablet, TAKE ONE (1) TABLET EACH DAY, Disp: 30 tablet, Rfl: 2;  brimonidine (ALPHAGAN) 0.2 % ophthalmic solution, Place 1 drop into both eyes 2 (two) times daily., Disp: , Rfl: ;  cholecalciferol (VITAMIN D) 1000 UNITS tablet, Take 1,000 Units by mouth daily., Disp: , Rfl:  Cyanocobalamin (VITAMIN B 12 PO), Take 1,000 mg by mouth every morning., Disp: , Rfl: ;  diltiazem (CARDIZEM CD) 300 MG 24 hr capsule, TAKE ONE (1) CAPSULE EACH DAY, Disp: 30 capsule, Rfl: 2;  folic acid (FOLVITE) 517 MCG tablet, Take 400 mcg by mouth 2 (two) times daily., Disp: , Rfl: ;  hydrochlorothiazide (HYDRODIURIL) 25 MG tablet, TAKE ONE (1) TABLET EACH DAY, Disp: 30 tablet, Rfl: 4 levothyroxine (SYNTHROID, LEVOTHROID) 50 MCG tablet, TAKE ONE (1) TABLET EACH DAY, Disp: 30 tablet, Rfl: 11;  Polyethylene Glycol 3350 (MIRALAX PO), Take by mouth as needed., Disp: , Rfl:  Current facility-administered medications:Influenza vac split quadrivalent PF (FLUARIX) injection 0.5 mL, 0.5 mL, Intramuscular, Once, Volanda Napoleon, MD  Allergies:  Allergies  Allergen Reactions  . Sulfa Antibiotics Rash    Past Medical History, Surgical history, Social history, and Family History were reviewed and updated.  Review of Systems: As above  Physical Exam:  height is 5\' 2"  (1.575 m) and weight is 120 lb (54.432 kg). Her oral temperature is 97.6 F (36.4 C). Her blood pressure is 112/50 and her pulse is 65. Her respiration is 14.   Totally, petite white female. She is somewhat pale. Lungs are clear. Cardiac exam regular rate and rhythm. She has an occasional extra beat. She is a 2/6 systolic ejection murmur. Abdomen is soft. She good bowel sounds. There is no fluid wave is no palpable liver or spleen tip. Back exam shows no tenderness over the spine ribs or hips. Chest slight kyphosis. Extremities shows minimal edema in her lower legs. Skin exam shows no rashes or ecchymosis.  Lab Results  Component Value Date   WBC 2.8* 08/10/2014   HGB 6.3* 08/10/2014   HCT 19.1* 08/10/2014   MCV 102* 08/10/2014   PLT 262 08/10/2014     Chemistry      Component Value Date/Time   NA 130* 05/27/2014 1624   NA 139 04/27/2013 1616   K 4.4 05/27/2014 1624   CL 90* 05/27/2014 1624   CO2 24 05/27/2014 1624   BUN 22 05/27/2014 1624   BUN 18 04/27/2013 1616   CREATININE 1.05* 05/27/2014 1624   CREATININE 1.02 04/27/2013 1616      Component Value Date/Time   CALCIUM 9.8 05/27/2014 1624   ALKPHOS 94 05/27/2014 1624   AST 34 05/27/2014 1624   ALT 44* 05/27/2014 1624   BILITOT 0.5 05/27/2014 1624  Impression and Plan: Monica Riggs is a 80 year old white female. She is myelodysplasia.  Again, she's been doing as well as one could expect.   She is getting transfused a little more often. She tries getting transfused every 4 weeks or so. Again, this is no surprise. She still is responded to transfusions. I think that as long as she responds, we should continue to transfuse her. Iron overload is not an issue from my point of view.    We will go ahead and transfuse her tomorrow.   I would like to get her  back in 3 weeks' time. I want to see her before the holidays. I want to make sure that we get her in as best shape as possible so she can enjoy the holidays with  her family.  Again, we will have to be cautious with her leukopenia. this is pretty stable right now.   Volanda Napoleon, MD 10/27/201510:55 AM

## 2014-08-10 NOTE — Telephone Encounter (Signed)
Received call from [place]. Patient's HGB was 6.3. Dr Marin Olp received value, no order received at this time.

## 2014-08-11 ENCOUNTER — Ambulatory Visit (HOSPITAL_BASED_OUTPATIENT_CLINIC_OR_DEPARTMENT_OTHER): Payer: Medicare Other

## 2014-08-11 VITALS — BP 120/57 | HR 53 | Temp 97.7°F | Resp 14

## 2014-08-11 DIAGNOSIS — Z23 Encounter for immunization: Secondary | ICD-10-CM

## 2014-08-11 DIAGNOSIS — D469 Myelodysplastic syndrome, unspecified: Secondary | ICD-10-CM | POA: Diagnosis not present

## 2014-08-11 DIAGNOSIS — IMO0001 Reserved for inherently not codable concepts without codable children: Secondary | ICD-10-CM

## 2014-08-11 LAB — PREPARE RBC (CROSSMATCH)

## 2014-08-11 MED ORDER — FUROSEMIDE 10 MG/ML IJ SOLN
10.0000 mg | Freq: Once | INTRAMUSCULAR | Status: AC
  Administered 2014-08-11: 10 mg via INTRAVENOUS

## 2014-08-11 MED ORDER — ACETAMINOPHEN 325 MG PO TABS
ORAL_TABLET | ORAL | Status: AC
  Filled 2014-08-11: qty 2

## 2014-08-11 MED ORDER — DIPHENHYDRAMINE HCL 25 MG PO CAPS
25.0000 mg | ORAL_CAPSULE | Freq: Once | ORAL | Status: AC
  Administered 2014-08-11: 25 mg via ORAL

## 2014-08-11 MED ORDER — INFLUENZA VAC SPLIT QUAD 0.5 ML IM SUSY
0.5000 mL | PREFILLED_SYRINGE | Freq: Once | INTRAMUSCULAR | Status: AC
  Administered 2014-08-11: 0.5 mL via INTRAMUSCULAR
  Filled 2014-08-11: qty 0.5

## 2014-08-11 MED ORDER — SODIUM CHLORIDE 0.9 % IV SOLN
250.0000 mL | Freq: Once | INTRAVENOUS | Status: AC
  Administered 2014-08-11: 250 mL via INTRAVENOUS

## 2014-08-11 MED ORDER — ACETAMINOPHEN 325 MG PO TABS
650.0000 mg | ORAL_TABLET | Freq: Once | ORAL | Status: AC
  Administered 2014-08-11: 650 mg via ORAL

## 2014-08-11 MED ORDER — DIPHENHYDRAMINE HCL 25 MG PO CAPS
ORAL_CAPSULE | ORAL | Status: AC
  Filled 2014-08-11: qty 1

## 2014-08-11 MED ORDER — FUROSEMIDE 10 MG/ML IJ SOLN
INTRAMUSCULAR | Status: AC
  Filled 2014-08-11: qty 4

## 2014-08-11 NOTE — Patient Instructions (Signed)

## 2014-08-12 ENCOUNTER — Encounter: Payer: Self-pay | Admitting: Hematology & Oncology

## 2014-08-12 LAB — TYPE AND SCREEN
ABO/RH(D): O NEG
Antibody Screen: NEGATIVE
UNIT DIVISION: 0
Unit division: 0

## 2014-08-31 ENCOUNTER — Other Ambulatory Visit: Payer: Medicare Other | Admitting: Lab

## 2014-08-31 ENCOUNTER — Ambulatory Visit: Payer: Medicare Other | Admitting: Family

## 2014-09-02 ENCOUNTER — Other Ambulatory Visit (HOSPITAL_BASED_OUTPATIENT_CLINIC_OR_DEPARTMENT_OTHER): Payer: Medicare Other | Admitting: Lab

## 2014-09-02 ENCOUNTER — Ambulatory Visit (HOSPITAL_BASED_OUTPATIENT_CLINIC_OR_DEPARTMENT_OTHER): Payer: Medicare Other | Admitting: Family

## 2014-09-02 ENCOUNTER — Encounter: Payer: Self-pay | Admitting: Family

## 2014-09-02 DIAGNOSIS — E559 Vitamin D deficiency, unspecified: Secondary | ICD-10-CM

## 2014-09-02 DIAGNOSIS — D469 Myelodysplastic syndrome, unspecified: Secondary | ICD-10-CM

## 2014-09-02 DIAGNOSIS — IMO0001 Reserved for inherently not codable concepts without codable children: Secondary | ICD-10-CM

## 2014-09-02 LAB — RETICULOCYTES (CHCC)
ABS Retic: 29.2 10*3/uL (ref 19.0–186.0)
RBC.: 2.65 MIL/uL — ABNORMAL LOW (ref 3.87–5.11)
Retic Ct Pct: 1.1 % (ref 0.4–2.3)

## 2014-09-02 LAB — CBC WITH DIFFERENTIAL (CANCER CENTER ONLY)
HCT: 25.5 % — ABNORMAL LOW (ref 34.8–46.6)
HEMOGLOBIN: 8.6 g/dL — AB (ref 11.6–15.9)
MCH: 33.1 pg (ref 26.0–34.0)
MCHC: 33.7 g/dL (ref 32.0–36.0)
MCV: 98 fL (ref 81–101)
Platelets: 222 10*3/uL (ref 145–400)
RBC: 2.6 10*6/uL — ABNORMAL LOW (ref 3.70–5.32)
RDW: 20.8 % — ABNORMAL HIGH (ref 11.1–15.7)
WBC: 2.4 10*3/uL — ABNORMAL LOW (ref 3.9–10.0)

## 2014-09-02 LAB — HOLD TUBE, BLOOD BANK - CHCC SATELLITE

## 2014-09-02 LAB — MANUAL DIFFERENTIAL (CHCC SATELLITE)
ALC: 0.9 10*3/uL (ref 0.6–2.2)
ANC (CHCC HP manual diff): 1.3 10*3/uL — ABNORMAL LOW (ref 1.5–6.7)
BASO: 3 % — ABNORMAL HIGH (ref 0–2)
Band Neutrophils: 2 % (ref 0–10)
Eos: 3 % (ref 0–7)
LYMPH: 38 % (ref 14–48)
MONO: 2 % (ref 0–13)
Myelocytes: 1 % — ABNORMAL HIGH (ref 0–0)
PLT EST ~~LOC~~: ADEQUATE
SEG: 51 % (ref 40–75)

## 2014-09-02 LAB — CHCC SATELLITE - SMEAR

## 2014-09-02 NOTE — Progress Notes (Signed)
[facility]  [phone] [phone]  ID: Monica Riggs OB: May 16, 1919 MR#: [id number] CZY#:[id number] Patient Care Team: [name], MD as PCP - General (Family Medicine) Helayne Seminole, PA-C as Physician Assistant (Hematology and Oncology)  DIAGNOSIS: Refractory anemia  INTERVAL HISTORY: Monica Riggs is here today for labs and follow-up. She is feeling ok but gets tired at times.  She was last transfused in October. Her Hgb today is up to 8.6.  She has had no pain or bleeding. She denies fever, chills, n/v, cough, rash, headache, dizziness, SOB, chest pain, palpitations, abdominal pain, diarrhea, blood in urine or stool. She uses Mirilax to help with her constipation.  She denies swelling, tenderness, numbness or tingling in her extremities. Her appetite is not good. She says that food doesn't taste as good anymore. She is trying to drink more water. Her weight is down 2 lbs this visit.  She is still living by herself.   CURRENT TREATMENT: Supportive care with transfusions   REVIEW OF SYSTEMS: All other 10 point review of systems is negative except for those issues mentioned above.   PAST MEDICAL HISTORY: Past Medical History  Diagnosis Date  . Hyperlipidemia   . Hypertension   . Myelodysplasia   . Arthritis    PAST SURGICAL HISTORY: No past surgical history on file.  FAMILY HISTORY No family history on file.  GYNECOLOGIC HISTORY:  No LMP recorded. Patient is postmenopausal.   SOCIAL HISTORY:  History   Social History  . Marital Status: Widowed    Spouse Name: N/A    Number of Children: N/A  . Years of Education: N/A   Occupational History  . Not on file.   Social History Main Topics  . Smoking status: Never Smoker   . Smokeless tobacco: Never Used     Comment: never used tobacco  . Alcohol Use: No  . Drug Use: No  . Sexual Activity: Not on file   Other Topics Concern  . Not on file   Social History Narrative   ADVANCED  DIRECTIVES: <no information>  HEALTH MAINTENANCE: History  Substance Use Topics  . Smoking status: Never Smoker   . Smokeless tobacco: Never Used     Comment: never used tobacco  . Alcohol Use: No   Colonoscopy: PAP: Bone density: Lipid panel:  Allergies  Allergen Reactions  . Sulfa Antibiotics Rash   Current Outpatient Prescriptions  Medication Sig Dispense Refill  . aspirin 325 MG EC tablet Take 325 mg by mouth daily.    Marland Kitchen BENICAR 40 MG tablet TAKE ONE (1) TABLET EACH DAY 30 tablet 2  . brimonidine (ALPHAGAN) 0.2 % ophthalmic solution Place 1 drop into both eyes 2 (two) times daily.    . cholecalciferol (VITAMIN D) 1000 UNITS tablet Take 1,000 Units by mouth daily.    . Cyanocobalamin (VITAMIN B 12 PO) Take 1,000 mg by mouth every morning.    . diltiazem (CARDIZEM CD) 300 MG 24 hr capsule TAKE ONE (1) CAPSULE EACH DAY 30 capsule 2  . folic acid (FOLVITE) 093 MCG tablet Take 400 mcg by mouth 2 (two) times daily.    . hydrochlorothiazide (HYDRODIURIL) 25 MG tablet TAKE ONE (1) TABLET EACH DAY 30 tablet 4  . levothyroxine (SYNTHROID, LEVOTHROID) 50 MCG tablet TAKE ONE (1) TABLET EACH DAY 30 tablet 11  . Polyethylene Glycol 3350 (MIRALAX PO) Take by mouth as needed.     No current facility-administered medications for this visit.   OBJECTIVE: Filed Vitals:  09/02/14 0925  BP: 110/60  Pulse: 73  Temp: 97.9 F (36.6 C)  Resp: 18   Body mass index is 21.58 kg/(m^2). ECOG FS:1 - Symptomatic but completely ambulatory Ocular: Sclerae unicteric, pupils equal, round and reactive to light Ear-nose-throat: Oropharynx clear, dentition fair Lymphatic: No cervical or supraclavicular adenopathy Lungs no rales or rhonchi, good excursion bilaterally Heart regular rate and rhythm, no murmur appreciated Abd soft, nontender, positive bowel sounds MSK no focal spinal tenderness, no joint edema Neuro: non-focal, well-oriented, appropriate affect Breasts: Deferred ROS Physical  Exam  LAB RESULTS: CMP     Component Value Date/Time   NA 130* 05/27/2014 1624   NA 139 04/27/2013 1616   K 4.4 05/27/2014 1624   CL 90* 05/27/2014 1624   CO2 24 05/27/2014 1624   GLUCOSE 94 05/27/2014 1624   GLUCOSE 111* 04/27/2013 1616   BUN 22 05/27/2014 1624   BUN 18 04/27/2013 1616   CREATININE 1.05* 05/27/2014 1624   CREATININE 1.02 04/27/2013 1616   CALCIUM 9.8 05/27/2014 1624   PROT 6.6 05/27/2014 1624   PROT 6.4 04/27/2013 1616   ALBUMIN 4.1 04/27/2013 1616   AST 34 05/27/2014 1624   ALT 44* 05/27/2014 1624   ALKPHOS 94 05/27/2014 1624   BILITOT 0.5 05/27/2014 1624   GFRNONAA 46* 05/27/2014 1624   GFRNONAA 48* 04/27/2013 1616   GFRAA 53* 05/27/2014 1624   GFRAA 55* 04/27/2013 1616   No results found for: SPEP Lab Results  Component Value Date   WBC 2.8* 08/10/2014   NEUTROABS 2.1 05/27/2014   HGB 6.3* 08/10/2014   HCT 19.1* 08/10/2014   MCV 102* 08/10/2014   PLT 262 08/10/2014   No results found for: LABCA2 No components found for: [id number] No results for input(s): INR in the last 168 hours. Urinalysis No results found for: COLORURINE STUDIES: No results found.  [name] is a 80 year old white female with myelodysplasia. She is feeling ok today.  Her Hgb is up to 8.6. We will hold off on transfusing her for now. [name].  We will see what her iron studies show. She is going to increase her calorie count and eat small portions multiple times a day to try and increase her weight.   We will see her back for follow-up and labs in 1 month.  She knows to call here with any questions or concerns and to go to the ED in the event of an emergency. We can certainly see her sooner if need be.  Eliezer Bottom, NP 09/02/2014 9:37 AM

## 2014-09-14 ENCOUNTER — Encounter: Payer: Self-pay | Admitting: Family Medicine

## 2014-09-14 ENCOUNTER — Ambulatory Visit (INDEPENDENT_AMBULATORY_CARE_PROVIDER_SITE_OTHER): Payer: Medicare Other | Admitting: Family Medicine

## 2014-09-14 VITALS — BP 144/63 | HR 93 | Temp 96.8°F | Ht 62.0 in | Wt 119.0 lb

## 2014-09-14 DIAGNOSIS — E039 Hypothyroidism, unspecified: Secondary | ICD-10-CM

## 2014-09-14 DIAGNOSIS — E785 Hyperlipidemia, unspecified: Secondary | ICD-10-CM

## 2014-09-14 DIAGNOSIS — M25551 Pain in right hip: Secondary | ICD-10-CM

## 2014-09-14 DIAGNOSIS — I4819 Other persistent atrial fibrillation: Secondary | ICD-10-CM

## 2014-09-14 DIAGNOSIS — D508 Other iron deficiency anemias: Secondary | ICD-10-CM

## 2014-09-14 DIAGNOSIS — M25552 Pain in left hip: Secondary | ICD-10-CM

## 2014-09-14 DIAGNOSIS — I481 Persistent atrial fibrillation: Secondary | ICD-10-CM

## 2014-09-14 DIAGNOSIS — E559 Vitamin D deficiency, unspecified: Secondary | ICD-10-CM

## 2014-09-14 DIAGNOSIS — D6189 Other specified aplastic anemias and other bone marrow failure syndromes: Secondary | ICD-10-CM

## 2014-09-14 NOTE — Progress Notes (Signed)
Subjective:    Patient ID: Monica Riggs, female    DOB: 1919-07-12, 80 y.o.   MRN: [id number]  HPI  Pt here for follow up and management of chronic medical problems. The patient has myelodysplasia with a severe anemia and requires periodic transfusions. Her granddaughter is very involved in her care. When she gets the transfusion she stays with her granddaughter in [place]. The hematology/oncologist is Dr. Burney Gauze. The patient complains of bilateral hip pain. Even though she has a hip pain she does not want to do anything about it at this point in time. We will defer x-rays and visit with orthopedist until the pain gets worse. The patient does have a lifeline that she wears all the time.   Patient Active Problem List   Diagnosis Date Noted  . Hyperlipemia 04/27/2013  . Hypothyroid 04/27/2013  . Fatigue 04/27/2013  . Vitamin D deficiency 04/27/2013  . Myelodysplasia 01/12/2013   Outpatient Encounter Prescriptions as of 09/14/2014  Medication Sig  . aspirin 325 MG EC tablet Take 325 mg by mouth daily.  Marland Kitchen atorvastatin (LIPITOR) 40 MG tablet Take 40 mg by mouth daily.  Marland Kitchen BENICAR 40 MG tablet TAKE ONE (1) TABLET EACH DAY  . brimonidine (ALPHAGAN) 0.2 % ophthalmic solution Place 1 drop into both eyes 2 (two) times daily.  . cholecalciferol (VITAMIN D) 1000 UNITS tablet Take 1,000 Units by mouth daily.  . Cyanocobalamin (VITAMIN B 12 PO) Take 1,000 mg by mouth every morning.  . diltiazem (CARDIZEM CD) 300 MG 24 hr capsule TAKE ONE (1) CAPSULE EACH DAY  . folic acid (FOLVITE) 546 MCG tablet Take 400 mcg by mouth 2 (two) times daily.  . hydrochlorothiazide (HYDRODIURIL) 25 MG tablet TAKE ONE (1) TABLET EACH DAY  . levothyroxine (SYNTHROID, LEVOTHROID) 50 MCG tablet TAKE ONE (1) TABLET EACH DAY  . Polyethylene Glycol 3350 (MIRALAX PO) Take by mouth as needed.    Review of Systems  Constitutional: Negative.   HENT: Negative.   Eyes: Negative.   Respiratory: Negative.     Cardiovascular: Negative.   Gastrointestinal: Negative.   Endocrine: Negative.   Genitourinary: Negative.   Musculoskeletal: Positive for arthralgias (bilateral hip pain).  Skin: Negative.   Allergic/Immunologic: Negative.   Neurological: Negative.   Hematological: Negative.   Psychiatric/Behavioral: Negative.        Objective:   Physical Exam  Constitutional: She is oriented to person, place, and time. No distress.  The patient is alert and cooperative and has pallor secondary to her anemia.  HENT:  Head: Normocephalic and atraumatic.  Right Ear: External ear normal.  Left Ear: External ear normal.  Nose: Nose normal.  Mouth/Throat: Oropharynx is clear and moist.  Eyes: Conjunctivae and EOM are normal. Pupils are equal, round, and reactive to light. Right eye exhibits no discharge. Left eye exhibits no discharge. No scleral icterus.  Neck: Normal range of motion. Neck supple. No thyromegaly present.  Cardiovascular: Normal rate and normal heart sounds.   No murmur heard. The patient has an irregular irregular rhythm at 84/m  Pulmonary/Chest: Effort normal and breath sounds normal. No respiratory distress. She has no wheezes. She has no rales. She exhibits no tenderness.  Abdominal: Soft. Bowel sounds are normal. She exhibits no mass. There is no tenderness. There is no rebound and no guarding.  Musculoskeletal: Normal range of motion. She exhibits no edema or tenderness.  The patient was able to get on the table also table and had good hip movement with minimal pain.  Lymphadenopathy:    She has no cervical adenopathy.  Neurological: She is alert and oriented to person, place, and time.  Skin: Skin is warm and dry. No rash noted.  Pallor   Psychiatric: She has a normal mood and affect. Her behavior is normal. Judgment and thought content normal.  Nursing note and vitals reviewed.  BP 144/63 mmHg  Pulse 93  Temp(Src) 96.8 F (36 C) (Oral)  Ht 5' 2" (1.575 m)  Wt 119 lb  (53.978 kg)  BMI 21.76 kg/m2        Assessment & Plan:  1. Hyperlipemia - POCT CBC - BMP8+EGFR - Hepatic function panel - Lipid panel  2. Hypothyroidism, unspecified hypothyroidism type - POCT CBC - Thyroid Panel With TSH  3. Vitamin D deficiency - POCT CBC - Vit D  25 hydroxy (rtn osteoporosis monitoring)  4. Other iron deficiency anemias - POCT CBC  5. Bilateral hip pain - DG Hip Complete Right; Future - DG Hip Complete Left; Future  6. Persistent atrial fibrillation   7. Other specified aplastic anemia or other bone marrow failure syndrome  Patient Instructions                       Medicare Annual Wellness Visit  [facility] and the medical providers at [facility] strive to bring you the best medical care.  In doing so we not only want to address your current medical conditions and concerns but also to detect new conditions early and prevent illness, disease and health-related problems.    Medicare offers a yearly Wellness Visit which allows our clinical staff to assess your need for preventative services including immunizations, lifestyle education, counseling to decrease risk of preventable diseases and screening for fall risk and other medical concerns.    This visit is provided free of charge (no copay) for all Medicare recipients. The clinical pharmacists at [facility] have begun to conduct these Wellness Visits which will also include a thorough review of all your medications.    As you primary medical provider recommend that you make an appointment for your Annual Wellness Visit if you have not done so already this year.  You may set up this appointment before you leave today or you may call back ([phone]) and schedule an appointment.  Please make sure when you call that you mention that you are scheduling your Annual Wellness Visit with the clinical pharmacist so that the appointment may be made for the proper  length of time.     Continue current medications. Continue good therapeutic lifestyle changes which include good diet and exercise. Fall precautions discussed with patient. If an FOBT was given today- please return it to our front desk. If you are over 50 years old - you may need Prevnar 13 or the adult Pneumonia vaccine.  Flu Shots will be available at our office starting mid- September. Please call and schedule a FLU CLINIC APPOINTMENT.   Continue to be careful and move slowly and do not put herself at risk for falling We will call you with your lab work results once those results are available and we'll make sure that your hematologist gets a copy of that report Continue regular follow-up with hematology If hip pain gets worse, please call us and we will get x-rays and arrange for you to see the orthopedist who comes to our office   Don W. Moore MD   

## 2014-09-14 NOTE — Patient Instructions (Addendum)
Medicare Annual Wellness Visit  [facility] and the medical providers at [facility] strive to bring you the best medical care.  In doing so we not only want to address your current medical conditions and concerns but also to detect new conditions early and prevent illness, disease and health-related problems.    Medicare offers a yearly Wellness Visit which allows our clinical staff to assess your need for preventative services including immunizations, lifestyle education, counseling to decrease risk of preventable diseases and screening for fall risk and other medical concerns.    This visit is provided free of charge (no copay) for all Medicare recipients. The clinical pharmacists at [facility] have begun to conduct these Wellness Visits which will also include a thorough review of all your medications.    As you primary medical provider recommend that you make an appointment for your Annual Wellness Visit if you have not done so already this year.  You may set up this appointment before you leave today or you may call back ([phone]) and schedule an appointment.  Please make sure when you call that you mention that you are scheduling your Annual Wellness Visit with the clinical pharmacist so that the appointment may be made for the proper length of time.     Continue current medications. Continue good therapeutic lifestyle changes which include good diet and exercise. Fall precautions discussed with patient. If an FOBT was given today- please return it to our front desk. If you are over 3 years old - you may need Prevnar 36 or the adult Pneumonia vaccine.  Flu Shots will be available at our office starting mid- September. Please call and schedule a FLU CLINIC APPOINTMENT.   Continue to be careful and move slowly and do not put herself at risk for falling We will call you with your lab work results once those results are  available and we'll make sure that your hematologist gets a copy of that report Continue regular follow-up with hematology If hip pain gets worse, please call us and we will get x-rays and arrange for you to see the orthopedist who comes to our office

## 2014-09-15 LAB — BMP8+EGFR
BUN / CREAT RATIO: 21 (ref 11–26)
BUN: 27 mg/dL (ref 10–36)
CHLORIDE: 94 mmol/L — AB (ref 97–108)
CO2: 23 mmol/L (ref 18–29)
Calcium: 9.7 mg/dL (ref 8.7–10.3)
Creatinine, Ser: 1.28 mg/dL — ABNORMAL HIGH (ref 0.57–1.00)
GFR calc Af Amer: 42 mL/min/{1.73_m2} — ABNORMAL LOW (ref >59)
GFR calc non Af Amer: 36 mL/min/{1.73_m2} — ABNORMAL LOW (ref >59)
GLUCOSE: 115 mg/dL — AB (ref 65–99)
Potassium: 4.4 mmol/L (ref 3.5–5.2)
Sodium: 133 mmol/L — ABNORMAL LOW (ref 134–144)

## 2014-09-15 LAB — CBC WITH DIFFERENTIAL
BASOS ABS: 0.1 10*3/uL (ref 0.0–0.2)
Basos: 2 %
Eos: 6 %
Eosinophils Absolute: 0.2 10*3/uL (ref 0.0–0.4)
HCT: 23.3 % — ABNORMAL LOW (ref 34.0–46.6)
Hemoglobin: 8 g/dL — CL (ref 11.1–15.9)
IMMATURE GRANULOCYTES: 0 %
Immature Grans (Abs): 0 10*3/uL (ref 0.0–0.1)
LYMPHS ABS: 0.9 10*3/uL (ref 0.7–3.1)
Lymphs: 26 %
MCH: 32.7 pg (ref 26.6–33.0)
MCHC: 34.3 g/dL (ref 31.5–35.7)
MCV: 95 fL (ref 79–97)
MONOS ABS: 0.3 10*3/uL (ref 0.1–0.9)
Monocytes: 8 %
Neutrophils Absolute: 2 10*3/uL (ref 1.4–7.0)
Neutrophils Relative %: 58 %
PLATELETS: 243 10*3/uL (ref 150–379)
RBC: 2.45 x10E6/uL — AB (ref 3.77–5.28)
RDW: 23.2 % — AB (ref 12.3–15.4)
WBC: 3.5 10*3/uL (ref 3.4–10.8)

## 2014-09-15 LAB — HEPATIC FUNCTION PANEL
ALT: 23 IU/L (ref 0–32)
AST: 18 IU/L (ref 0–40)
Albumin: 4.1 g/dL (ref 3.2–4.6)
Alkaline Phosphatase: 117 IU/L (ref 39–117)
BILIRUBIN DIRECT: 0.18 mg/dL (ref 0.00–0.40)
Total Bilirubin: 0.5 mg/dL (ref 0.0–1.2)
Total Protein: 6.6 g/dL (ref 6.0–8.5)

## 2014-09-15 LAB — LIPID PANEL
CHOL/HDL RATIO: 1.7 ratio (ref 0.0–4.4)
CHOLESTEROL TOTAL: 109 mg/dL (ref 100–199)
HDL: 63 mg/dL (ref >39)
LDL Calculated: 32 mg/dL (ref 0–99)
TRIGLYCERIDES: 68 mg/dL (ref 0–149)
VLDL Cholesterol Cal: 14 mg/dL (ref 5–40)

## 2014-09-15 LAB — THYROID PANEL WITH TSH
Free Thyroxine Index: 1.9 (ref 1.2–4.9)
T3 UPTAKE RATIO: 30 % (ref 24–39)
T4 TOTAL: 6.3 ug/dL (ref 4.5–12.0)
TSH: 7.3 u[IU]/mL — ABNORMAL HIGH (ref 0.450–4.500)

## 2014-09-15 LAB — VITAMIN D 25 HYDROXY (VIT D DEFICIENCY, FRACTURES): Vit D, 25-Hydroxy: 49.6 ng/mL (ref 30.0–100.0)

## 2014-10-05 ENCOUNTER — Ambulatory Visit (HOSPITAL_BASED_OUTPATIENT_CLINIC_OR_DEPARTMENT_OTHER): Payer: Medicare Other | Admitting: Hematology & Oncology

## 2014-10-05 ENCOUNTER — Other Ambulatory Visit: Payer: Self-pay | Admitting: *Deleted

## 2014-10-05 ENCOUNTER — Ambulatory Visit (HOSPITAL_BASED_OUTPATIENT_CLINIC_OR_DEPARTMENT_OTHER): Payer: Medicare Other | Admitting: Lab

## 2014-10-05 ENCOUNTER — Encounter: Payer: Self-pay | Admitting: Hematology & Oncology

## 2014-10-05 ENCOUNTER — Ambulatory Visit (HOSPITAL_COMMUNITY)
Admission: RE | Admit: 2014-10-05 | Discharge: 2014-10-05 | Disposition: A | Discharge status: Home / Self Care | Payer: Medicare Other | Source: Ambulatory Visit | Attending: Hematology & Oncology | Admitting: Hematology & Oncology

## 2014-10-05 VITALS — BP 121/62 | HR 103 | Temp 98.0°F | Resp 14 | Ht 62.0 in | Wt 117.0 lb

## 2014-10-05 DIAGNOSIS — D469 Myelodysplastic syndrome, unspecified: Principal | ICD-10-CM

## 2014-10-05 DIAGNOSIS — Z79899 Other long term (current) drug therapy: Secondary | ICD-10-CM | POA: Diagnosis not present

## 2014-10-05 DIAGNOSIS — IMO0001 Reserved for inherently not codable concepts without codable children: Secondary | ICD-10-CM

## 2014-10-05 DIAGNOSIS — D464 Refractory anemia, unspecified: Secondary | ICD-10-CM | POA: Insufficient documentation

## 2014-10-05 DIAGNOSIS — D46Z Other myelodysplastic syndromes: Secondary | ICD-10-CM

## 2014-10-05 DIAGNOSIS — Z882 Allergy status to sulfonamides status: Secondary | ICD-10-CM | POA: Insufficient documentation

## 2014-10-05 DIAGNOSIS — Z7982 Long term (current) use of aspirin: Secondary | ICD-10-CM | POA: Diagnosis not present

## 2014-10-05 LAB — HOLD TUBE, BLOOD BANK - CHCC SATELLITE

## 2014-10-05 LAB — MANUAL DIFFERENTIAL (CHCC SATELLITE)
ALC: 0.8 10*3/uL (ref 0.6–2.2)
ANC (CHCC MAN DIFF): 0.9 10*3/uL — AB (ref 1.5–6.7)
BAND NEUTROPHILS: 1 % (ref 0–10)
BASO: 2 % (ref 0–2)
Eos: 12 % — ABNORMAL HIGH (ref 0–7)
LYMPH: 38 % (ref 14–48)
MONO: 8 % (ref 0–13)
PLT EST ~~LOC~~: ADEQUATE
Platelet Morphology: NORMAL
SEG: 39 % — ABNORMAL LOW (ref 40–75)

## 2014-10-05 LAB — PREPARE RBC (CROSSMATCH)

## 2014-10-05 LAB — CBC WITH DIFFERENTIAL (CANCER CENTER ONLY)
HCT: 18.6 % — ABNORMAL LOW (ref 34.8–46.6)
HGB: 6.1 g/dL — CL (ref 11.6–15.9)
MCH: 33.9 pg (ref 26.0–34.0)
MCHC: 32.8 g/dL (ref 32.0–36.0)
MCV: 103 fL — AB (ref 81–101)
PLATELETS: 306 10*3/uL (ref 145–400)
RBC: 1.8 10*6/uL — AB (ref 3.70–5.32)
WBC: 2.2 10*3/uL — ABNORMAL LOW (ref 3.9–10.0)

## 2014-10-05 LAB — CHCC SATELLITE - SMEAR

## 2014-10-05 NOTE — Progress Notes (Signed)
Hematology and Oncology Follow Up Visit  Monica Riggs [id number] [birth year] 80 y.o. 10/05/2014   Principle Diagnosis:  Refractory anemia  Current Therapy:   Supportive care with transfusions      Interim History:  Ms.  Riggs is back for followup. She is doing okay. She is not be followed by hospice no longer. Her appetite has been down a little bit. She just does not have a lot of hunger. I certainly understand this. This really is not surprising to me.  She did have a nice Thanksgiving. She is getting ready for Christmas. She will be at her granddaughter's house.  She's had no bleeding. She has had no fever.. She's had no cough. There's been no shortness of breath.   Her overall, her performance status is ECOG 3.  Medications: Current outpatient prescriptions: aspirin 325 MG EC tablet, Take 325 mg by mouth daily., Disp: , Rfl: ;  atorvastatin (LIPITOR) 40 MG tablet, Take 40 mg by mouth daily., Disp: , Rfl: ;  BENICAR 40 MG tablet, TAKE ONE (1) TABLET EACH DAY, Disp: 30 tablet, Rfl: 2;  brimonidine (ALPHAGAN) 0.2 % ophthalmic solution, Place 1 drop into both eyes 2 (two) times daily., Disp: , Rfl:  cholecalciferol (VITAMIN D) 1000 UNITS tablet, Take 1,000 Units by mouth daily., Disp: , Rfl: ;  Cyanocobalamin (VITAMIN B 12 PO), Take 1,000 mg by mouth every morning., Disp: , Rfl: ;  diltiazem (CARDIZEM CD) 300 MG 24 hr capsule, TAKE ONE (1) CAPSULE EACH DAY, Disp: 30 capsule, Rfl: 2;  folic acid (FOLVITE) 623 MCG tablet, Take 400 mcg by mouth 2 (two) times daily., Disp: , Rfl:  hydrochlorothiazide (HYDRODIURIL) 25 MG tablet, TAKE ONE (1) TABLET EACH DAY, Disp: 30 tablet, Rfl: 4;  levothyroxine (SYNTHROID, LEVOTHROID) 50 MCG tablet, TAKE ONE (1) TABLET EACH DAY, Disp: 30 tablet, Rfl: 11;  Polyethylene Glycol 3350 (MIRALAX PO), Take by mouth as needed., Disp: , Rfl:   Allergies:  Allergies  Allergen Reactions  . Sulfa Antibiotics Rash    Past Medical History, Surgical history, Social  history, and Family History were reviewed and updated.  Review of Systems: As above  Physical Exam:  height is 5\' 2"  (1.575 m) and weight is 117 lb (53.071 kg). Her oral temperature is 98 F (36.7 C). Her blood pressure is 121/62 and her pulse is 103. Her respiration is 14.   Totally, petite white female. She is somewhat pale. Lungs are clear. Cardiac exam irregular rate and rhythm consistent with atrial fibrillation. . She is a 2/6 systolic ejection murmur. Abdomen is soft. She has good bowel sounds. There is no fluid wave is no palpable liver or spleen . Back exam shows no tenderness over the spine ribs or hips. There is some slight kyphosis. Extremities shows minimal edema in her lower legs. Skin exam shows no rashes or ecchymosis. Neurological exam is nonfocal.  Lab Results  Component Value Date   WBC 2.2* 10/05/2014   HGB 6.1* 10/05/2014   HCT 18.6* 10/05/2014   MCV 103* 10/05/2014   PLT 306 10/05/2014     Chemistry      Component Value Date/Time   NA 133* 09/14/2014 1630   NA 139 04/27/2013 1616   K 4.4 09/14/2014 1630   CL 94* 09/14/2014 1630   CO2 23 09/14/2014 1630   BUN 27 09/14/2014 1630   BUN 18 04/27/2013 1616   CREATININE 1.28* 09/14/2014 1630   CREATININE 1.02 04/27/2013 1616      Component Value Date/Time  CALCIUM 9.7 09/14/2014 1630   ALKPHOS 117 09/14/2014 1630   AST 18 09/14/2014 1630   ALT 23 09/14/2014 1630   BILITOT 0.5 09/14/2014 1630         Impression and Plan: Monica Riggs is a 80 year old white female. She is myelodysplasia.  Again, she's been doing as well as one could expect.   She will be getting transfused tomorrow. She tries getting transfused every 4-6 weeks or so. Again, this is no surprise. She still is responded to transfusions. I think that as long as she responds, we should continue to transfuse her. Iron overload is not an issue from my point of view.    We will go ahead and transfuse her tomorrow. I want to make sure that she  feels good for Christmas.  In reality, she's had a really good year. I was not sure she would make it through this year. However, she has shown a lot of determination.  Again, we will have to be cautious with her leukopenia. this is pretty stable right now.   Volanda Napoleon, MD 12/22/201510:44 AM

## 2014-10-06 ENCOUNTER — Ambulatory Visit (HOSPITAL_BASED_OUTPATIENT_CLINIC_OR_DEPARTMENT_OTHER): Payer: Medicare Other

## 2014-10-06 VITALS — BP 101/54 | HR 62 | Temp 98.1°F | Resp 20

## 2014-10-06 DIAGNOSIS — D464 Refractory anemia, unspecified: Secondary | ICD-10-CM | POA: Diagnosis not present

## 2014-10-06 DIAGNOSIS — IMO0001 Reserved for inherently not codable concepts without codable children: Secondary | ICD-10-CM

## 2014-10-06 DIAGNOSIS — D46Z Other myelodysplastic syndromes: Secondary | ICD-10-CM

## 2014-10-06 DIAGNOSIS — D469 Myelodysplastic syndrome, unspecified: Principal | ICD-10-CM

## 2014-10-06 LAB — PREPARE RBC (CROSSMATCH)

## 2014-10-06 MED ORDER — FUROSEMIDE 10 MG/ML IJ SOLN: 20.0000 mg | Freq: Once | INTRAMUSCULAR | Status: DC

## 2014-10-06 MED ORDER — ACETAMINOPHEN 325 MG PO TABS
650.0000 mg | ORAL_TABLET | Freq: Once | ORAL | Status: AC
  Administered 2014-10-06: 650 mg via ORAL

## 2014-10-06 MED ORDER — SODIUM CHLORIDE 0.9 % IV SOLN
250.0000 mL | Freq: Once | INTRAVENOUS | Status: AC
  Administered 2014-10-06: 250 mL via INTRAVENOUS

## 2014-10-06 MED ORDER — DIPHENHYDRAMINE HCL 25 MG PO CAPS
ORAL_CAPSULE | ORAL | Status: AC
  Filled 2014-10-06: qty 1

## 2014-10-06 MED ORDER — DIPHENHYDRAMINE HCL 25 MG PO CAPS
25.0000 mg | ORAL_CAPSULE | Freq: Once | ORAL | Status: AC
  Administered 2014-10-06: 25 mg via ORAL

## 2014-10-06 MED ORDER — ACETAMINOPHEN 325 MG PO TABS
ORAL_TABLET | ORAL | Status: AC
  Filled 2014-10-06: qty 2

## 2014-10-06 NOTE — Patient Instructions (Signed)

## 2014-10-07 LAB — TYPE AND SCREEN
ABO/RH(D): O NEG
Antibody Screen: NEGATIVE
UNIT DIVISION: 0
Unit division: 0

## 2014-10-14 ENCOUNTER — Encounter: Payer: Self-pay | Admitting: Hematology & Oncology

## 2014-10-14 ENCOUNTER — Other Ambulatory Visit: Payer: Self-pay | Admitting: *Deleted

## 2014-10-14 MED ORDER — DILTIAZEM HCL ER COATED BEADS 300 MG PO CP24: ORAL_CAPSULE | ORAL | Status: DC

## 2014-10-18 ENCOUNTER — Encounter: Payer: Self-pay | Admitting: *Deleted

## 2014-10-20 ENCOUNTER — Telehealth: Payer: Self-pay | Admitting: Hematology & Oncology

## 2014-10-20 NOTE — Telephone Encounter (Signed)
Pt moved 1-12 to 1-19

## 2014-10-26 ENCOUNTER — Other Ambulatory Visit: Payer: Medicare Other | Admitting: Lab

## 2014-10-26 ENCOUNTER — Ambulatory Visit: Payer: Medicare Other | Admitting: Family

## 2014-11-02 ENCOUNTER — Ambulatory Visit (HOSPITAL_BASED_OUTPATIENT_CLINIC_OR_DEPARTMENT_OTHER): Payer: Medicare Other | Admitting: Family

## 2014-11-02 ENCOUNTER — Ambulatory Visit (HOSPITAL_COMMUNITY)
Admission: RE | Admit: 2014-11-02 | Discharge: 2014-11-02 | Disposition: A | Discharge status: Home / Self Care | Payer: Medicare Other | Source: Ambulatory Visit | Attending: Hematology & Oncology | Admitting: Hematology & Oncology

## 2014-11-02 ENCOUNTER — Encounter: Payer: Self-pay | Admitting: Family

## 2014-11-02 ENCOUNTER — Other Ambulatory Visit (HOSPITAL_BASED_OUTPATIENT_CLINIC_OR_DEPARTMENT_OTHER): Payer: Medicare Other | Admitting: Lab

## 2014-11-02 VITALS — BP 97/42 | HR 60 | Resp 16 | Wt 115.0 lb

## 2014-11-02 DIAGNOSIS — D469 Myelodysplastic syndrome, unspecified: Secondary | ICD-10-CM

## 2014-11-02 DIAGNOSIS — D464 Refractory anemia, unspecified: Secondary | ICD-10-CM | POA: Insufficient documentation

## 2014-11-02 DIAGNOSIS — I10 Essential (primary) hypertension: Secondary | ICD-10-CM | POA: Diagnosis not present

## 2014-11-02 DIAGNOSIS — Z79899 Other long term (current) drug therapy: Secondary | ICD-10-CM | POA: Diagnosis not present

## 2014-11-02 DIAGNOSIS — E785 Hyperlipidemia, unspecified: Secondary | ICD-10-CM | POA: Insufficient documentation

## 2014-11-02 DIAGNOSIS — Z7982 Long term (current) use of aspirin: Secondary | ICD-10-CM | POA: Diagnosis not present

## 2014-11-02 DIAGNOSIS — D46Z Other myelodysplastic syndromes: Secondary | ICD-10-CM

## 2014-11-02 LAB — CBC WITH DIFFERENTIAL (CANCER CENTER ONLY)
HEMATOCRIT: 22.5 % — AB (ref 34.8–46.6)
HGB: 7.4 g/dL — ABNORMAL LOW (ref 11.6–15.9)
MCH: 32.6 pg (ref 26.0–34.0)
MCHC: 32.9 g/dL (ref 32.0–36.0)
MCV: 99 fL (ref 81–101)
Platelets: 264 10*3/uL (ref 145–400)
RBC: 2.27 10*6/uL — ABNORMAL LOW (ref 3.70–5.32)
RDW: 23.9 % — ABNORMAL HIGH (ref 11.1–15.7)
WBC: 2.6 10*3/uL — ABNORMAL LOW (ref 3.9–10.0)

## 2014-11-02 LAB — MANUAL DIFFERENTIAL (CHCC SATELLITE)
ALC: 0.8 10*3/uL (ref 0.6–2.2)
ANC (CHCC HP manual diff): 1.4 10*3/uL — ABNORMAL LOW (ref 1.5–6.7)
BAND NEUTROPHILS: 3 % (ref 0–10)
BASO: 1 % (ref 0–2)
Eos: 9 % — ABNORMAL HIGH (ref 0–7)
LYMPH: 30 % (ref 14–48)
MONO: 6 % (ref 0–13)
Myelocytes: 2 % — ABNORMAL HIGH (ref 0–0)
PLT EST ~~LOC~~: ADEQUATE
SEG: 49 % (ref 40–75)
nRBC: 1 % — ABNORMAL HIGH (ref 0–0)

## 2014-11-02 LAB — CMP (CANCER CENTER ONLY)
ALK PHOS: 78 U/L (ref 26–84)
ALT(SGPT): 26 U/L (ref 10–47)
AST: 25 U/L (ref 11–38)
Albumin: 3.3 g/dL (ref 3.3–5.5)
BUN: 24 mg/dL — AB (ref 7–22)
CALCIUM: 10.2 mg/dL (ref 8.0–10.3)
CHLORIDE: 97 meq/L — AB (ref 98–108)
CO2: 30 mEq/L (ref 18–33)
Creat: 1.2 mg/dl (ref 0.6–1.2)
GLUCOSE: 114 mg/dL (ref 73–118)
Potassium: 3.5 mEq/L (ref 3.3–4.7)
SODIUM: 136 meq/L (ref 128–145)
TOTAL PROTEIN: 6.7 g/dL (ref 6.4–8.1)
Total Bilirubin: 0.7 mg/dl (ref 0.20–1.60)

## 2014-11-02 LAB — HOLD TUBE, BLOOD BANK - CHCC SATELLITE

## 2014-11-02 NOTE — Progress Notes (Signed)
[place]  [phone]) [phone] [phone]) [phone]  ID: Monica Riggs OB: 13-Jan-1919 MR#: [id number] SNK#:[id number] Patient Care Team: [name], MD as PCP - General (Family Medicine) Helayne Seminole, PA-C as Physician Assistant (Hematology and Oncology)  DIAGNOSIS: Refractory anemia  INTERVAL HISTORY: Monica Riggs is here today for labs and follow-up. She doing ok. She still has some fatigue at times and her appetite is down.  She was last transfused in December for Hgb of 6.1.  She has had no aches, pains or bleeding. She denies fever, chills, n/v, cough, rash, headache, dizziness, SOB, chest pain, palpitations, abdominal pain, diarrhea, blood in urine or stool. She uses Mirilax to help with her constipation.  No swelling, tenderness, numbness or tingling in her extremities. As mentioned above, her appetite is not good. She says that food doesn't taste as good anymore. Her weight is down 2 lbs this visit at 115 lbs. She is drinking fluids.  She is still living by herself but stays with her friend after she is transfused for a few days.   CURRENT TREATMENT: Supportive care with transfusions   REVIEW OF SYSTEMS: All other 10 point review of systems is negative except for those issues mentioned above.   PAST MEDICAL HISTORY: Past Medical History  Diagnosis Date  . Hyperlipidemia   . Hypertension   . Myelodysplasia   . Arthritis    PAST SURGICAL HISTORY: History reviewed. No pertinent past surgical history.  FAMILY HISTORY History reviewed. No pertinent family history.  GYNECOLOGIC HISTORY:  No LMP recorded. Patient is postmenopausal.   SOCIAL HISTORY:  History   Social History  . Marital Status: Widowed    Spouse Name: N/A    Number of Children: N/A  . Years of Education: N/A   Occupational History  . Not on file.   Social History Main Topics  . Smoking status: Never Smoker   . Smokeless tobacco: Never Used     Comment: never used tobacco  .  Alcohol Use: No  . Drug Use: No  . Sexual Activity: Not on file   Other Topics Concern  . Not on file   Social History Narrative   ADVANCED DIRECTIVES: <no information>  HEALTH MAINTENANCE: History  Substance Use Topics  . Smoking status: Never Smoker   . Smokeless tobacco: Never Used     Comment: never used tobacco  . Alcohol Use: No   Colonoscopy: PAP: Bone density: Lipid panel:  Allergies  Allergen Reactions  . Sulfa Antibiotics Rash   Current Outpatient Prescriptions  Medication Sig Dispense Refill  . aspirin 325 MG EC tablet Take 325 mg by mouth daily.    Marland Kitchen atorvastatin (LIPITOR) 40 MG tablet Take 40 mg by mouth daily.    Marland Kitchen BENICAR 40 MG tablet TAKE ONE (1) TABLET EACH DAY 30 tablet 2  . brimonidine (ALPHAGAN) 0.2 % ophthalmic solution Place 1 drop into both eyes 2 (two) times daily.    . cholecalciferol (VITAMIN D) 1000 UNITS tablet Take 1,000 Units by mouth daily.    . Cyanocobalamin (VITAMIN B 12 PO) Take 1,000 mg by mouth every morning.    . diltiazem (CARDIZEM CD) 300 MG 24 hr capsule TAKE ONE (1) CAPSULE EACH DAY 30 capsule 3  . folic acid (FOLVITE) 937 MCG tablet Take 400 mcg by mouth 2 (two) times daily.    . hydrochlorothiazide (HYDRODIURIL) 25 MG tablet TAKE ONE (1) TABLET EACH DAY 30 tablet 4  . levothyroxine (SYNTHROID, LEVOTHROID) 50 MCG tablet TAKE  ONE (1) TABLET EACH DAY 30 tablet 11  . Polyethylene Glycol 3350 (MIRALAX PO) Take by mouth as needed.     No current facility-administered medications for this visit.   OBJECTIVE: Filed Vitals:   11/02/14 1025  BP: 97/42  Pulse: 60  Resp: 16   Body mass index is 21.03 kg/(m^2). ECOG FS:1 - Symptomatic but completely ambulatory Ocular: Sclerae unicteric, pupils equal, round and reactive to light Ear-nose-throat: Oropharynx clear, dentition fair Lymphatic: No cervical or supraclavicular adenopathy Lungs no rales or rhonchi, good excursion bilaterally Heart regular rate and rhythm, no murmur  appreciated Abd soft, nontender, positive bowel sounds MSK no focal spinal tenderness, no joint edema Neuro: non-focal, well-oriented, appropriate affect Breasts: Deferred ROS Physical Exam  LAB RESULTS: CMP     Component Value Date/Time   NA 136 11/02/2014 1007   NA 133* 09/14/2014 1630   NA 139 04/27/2013 1616   K 3.5 11/02/2014 1007   K 4.4 09/14/2014 1630   CL 97* 11/02/2014 1007   CL 94* 09/14/2014 1630   CO2 30 11/02/2014 1007   CO2 23 09/14/2014 1630   GLUCOSE 114 11/02/2014 1007   GLUCOSE 115* 09/14/2014 1630   GLUCOSE 111* 04/27/2013 1616   BUN 24* 11/02/2014 1007   BUN 27 09/14/2014 1630   BUN 18 04/27/2013 1616   CREATININE 1.2 11/02/2014 1007   CREATININE 1.28* 09/14/2014 1630   CALCIUM 10.2 11/02/2014 1007   CALCIUM 9.7 09/14/2014 1630   PROT 6.7 11/02/2014 1007   PROT 6.6 09/14/2014 1630   PROT 6.4 04/27/2013 1616   ALBUMIN 4.1 04/27/2013 1616   AST 25 11/02/2014 1007   AST 18 09/14/2014 1630   ALT 26 11/02/2014 1007   ALT 23 09/14/2014 1630   ALKPHOS 78 11/02/2014 1007   ALKPHOS 117 09/14/2014 1630   BILITOT 0.70 11/02/2014 1007   BILITOT 0.5 09/14/2014 1630   GFRNONAA 36* 09/14/2014 1630   GFRNONAA 48* 04/27/2013 1616   GFRAA 42* 09/14/2014 1630   GFRAA 55* 04/27/2013 1616   No results found for: SPEP Lab Results  Component Value Date   WBC 2.6* 11/02/2014   NEUTROABS 2.0 09/14/2014   HGB 7.4* 11/02/2014   HCT 22.5* 11/02/2014   MCV 99 11/02/2014   PLT 264 11/02/2014   No results found for: LABCA2 No components found for: [id number] No results for input(s): INR in the last 168 hours. Urinalysis No results found for: COLORURINE STUDIES: No results found.  [name] is a 81 year old white female with myelodysplasia. She is feeling ok today.  Her Hgb is 7.4. We will go ahead and give her Aranesp today.  We will have her come in tomorrow at 9 am for 1 unit of blood.  We will see what her iron studies show. We will see  her back for follow-up and labs in 1 month.  She knows to call here with any questions or concerns and to go to the ED in the event of an emergency. We can certainly see her sooner if need be.  Eliezer Bottom, NP 11/02/2014 12:14 PM

## 2014-11-03 ENCOUNTER — Ambulatory Visit (HOSPITAL_BASED_OUTPATIENT_CLINIC_OR_DEPARTMENT_OTHER): Payer: Medicare Other

## 2014-11-03 ENCOUNTER — Encounter

## 2014-11-03 VITALS — BP 96/54 | HR 62 | Temp 97.4°F | Resp 20

## 2014-11-03 DIAGNOSIS — I10 Essential (primary) hypertension: Secondary | ICD-10-CM | POA: Diagnosis not present

## 2014-11-03 DIAGNOSIS — D469 Myelodysplastic syndrome, unspecified: Secondary | ICD-10-CM | POA: Diagnosis not present

## 2014-11-03 DIAGNOSIS — D464 Refractory anemia, unspecified: Secondary | ICD-10-CM

## 2014-11-03 DIAGNOSIS — E785 Hyperlipidemia, unspecified: Secondary | ICD-10-CM | POA: Diagnosis not present

## 2014-11-03 DIAGNOSIS — Z7982 Long term (current) use of aspirin: Secondary | ICD-10-CM | POA: Diagnosis not present

## 2014-11-03 DIAGNOSIS — Z79899 Other long term (current) drug therapy: Secondary | ICD-10-CM | POA: Diagnosis not present

## 2014-11-03 LAB — PREPARE RBC (CROSSMATCH)

## 2014-11-03 MED ORDER — ACETAMINOPHEN 325 MG PO TABS
650.0000 mg | ORAL_TABLET | Freq: Once | ORAL | Status: AC
  Administered 2014-11-03: 650 mg via ORAL

## 2014-11-03 MED ORDER — ACETAMINOPHEN 325 MG PO TABS
ORAL_TABLET | ORAL | Status: AC
  Filled 2014-11-03: qty 2

## 2014-11-03 MED ORDER — DIPHENHYDRAMINE HCL 25 MG PO CAPS
25.0000 mg | ORAL_CAPSULE | Freq: Once | ORAL | Status: AC
  Administered 2014-11-03: 25 mg via ORAL

## 2014-11-03 MED ORDER — SODIUM CHLORIDE 0.9 % IV SOLN
INTRAVENOUS | Status: DC
  Administered 2014-11-03: 09:00:00 via INTRAVENOUS

## 2014-11-03 MED ORDER — DIPHENHYDRAMINE HCL 25 MG PO CAPS
ORAL_CAPSULE | ORAL | Status: AC
  Filled 2014-11-03: qty 1

## 2014-11-03 MED ORDER — SODIUM CHLORIDE 0.9 % IJ SOLN
3.0000 mL | INTRAMUSCULAR | Status: DC | PRN
  Filled 2014-11-03: qty 10

## 2014-11-03 NOTE — Patient Instructions (Signed)

## 2014-11-04 ENCOUNTER — Encounter: Payer: Self-pay | Admitting: Hematology & Oncology

## 2014-11-04 LAB — TYPE AND SCREEN
ABO/RH(D): O NEG
Antibody Screen: NEGATIVE
Unit division: 0
Unit division: 0

## 2014-11-15 ENCOUNTER — Other Ambulatory Visit: Payer: Self-pay | Admitting: Nurse Practitioner

## 2014-11-17 ENCOUNTER — Inpatient Hospital Stay (HOSPITAL_COMMUNITY)
Admission: EM | Admit: 2014-11-17 | Discharge: 2014-11-19 | DRG: 811 | Disposition: A | Discharge status: Home Health | Payer: Medicare Other | Attending: Internal Medicine | Admitting: Internal Medicine

## 2014-11-17 ENCOUNTER — Encounter (HOSPITAL_COMMUNITY): Payer: Self-pay

## 2014-11-17 ENCOUNTER — Emergency Department (HOSPITAL_COMMUNITY): Payer: Medicare Other

## 2014-11-17 ENCOUNTER — Other Ambulatory Visit: Payer: Self-pay

## 2014-11-17 DIAGNOSIS — E785 Hyperlipidemia, unspecified: Secondary | ICD-10-CM | POA: Diagnosis not present

## 2014-11-17 DIAGNOSIS — I482 Chronic atrial fibrillation, unspecified: Secondary | ICD-10-CM

## 2014-11-17 DIAGNOSIS — E876 Hypokalemia: Secondary | ICD-10-CM | POA: Diagnosis not present

## 2014-11-17 DIAGNOSIS — D62 Acute posthemorrhagic anemia: Principal | ICD-10-CM | POA: Diagnosis present

## 2014-11-17 DIAGNOSIS — M25462 Effusion, left knee: Secondary | ICD-10-CM | POA: Diagnosis present

## 2014-11-17 DIAGNOSIS — D500 Iron deficiency anemia secondary to blood loss (chronic): Secondary | ICD-10-CM | POA: Diagnosis present

## 2014-11-17 DIAGNOSIS — IMO0001 Reserved for inherently not codable concepts without codable children: Secondary | ICD-10-CM

## 2014-11-17 DIAGNOSIS — M25559 Pain in unspecified hip: Secondary | ICD-10-CM | POA: Diagnosis present

## 2014-11-17 DIAGNOSIS — E43 Unspecified severe protein-calorie malnutrition: Secondary | ICD-10-CM | POA: Diagnosis not present

## 2014-11-17 DIAGNOSIS — I10 Essential (primary) hypertension: Secondary | ICD-10-CM | POA: Diagnosis present

## 2014-11-17 DIAGNOSIS — D469 Myelodysplastic syndrome, unspecified: Secondary | ICD-10-CM | POA: Diagnosis present

## 2014-11-17 DIAGNOSIS — E039 Hypothyroidism, unspecified: Secondary | ICD-10-CM | POA: Diagnosis present

## 2014-11-17 DIAGNOSIS — D649 Anemia, unspecified: Secondary | ICD-10-CM | POA: Diagnosis not present

## 2014-11-17 DIAGNOSIS — Z79899 Other long term (current) drug therapy: Secondary | ICD-10-CM | POA: Diagnosis not present

## 2014-11-17 DIAGNOSIS — J90 Pleural effusion, not elsewhere classified: Secondary | ICD-10-CM | POA: Diagnosis present

## 2014-11-17 DIAGNOSIS — Z823 Family history of stroke: Secondary | ICD-10-CM | POA: Diagnosis not present

## 2014-11-17 DIAGNOSIS — W102XXA Fall (on)(from) incline, initial encounter: Secondary | ICD-10-CM | POA: Diagnosis not present

## 2014-11-17 DIAGNOSIS — Z6821 Body mass index (BMI) 21.0-21.9, adult: Secondary | ICD-10-CM

## 2014-11-17 DIAGNOSIS — M2392 Unspecified internal derangement of left knee: Secondary | ICD-10-CM

## 2014-11-17 DIAGNOSIS — Z7982 Long term (current) use of aspirin: Secondary | ICD-10-CM

## 2014-11-17 DIAGNOSIS — E860 Dehydration: Secondary | ICD-10-CM | POA: Diagnosis present

## 2014-11-17 DIAGNOSIS — R52 Pain, unspecified: Secondary | ICD-10-CM | POA: Diagnosis not present

## 2014-11-17 DIAGNOSIS — D72818 Other decreased white blood cell count: Secondary | ICD-10-CM | POA: Diagnosis not present

## 2014-11-17 DIAGNOSIS — S299XXA Unspecified injury of thorax, initial encounter: Secondary | ICD-10-CM | POA: Diagnosis not present

## 2014-11-17 DIAGNOSIS — S79912A Unspecified injury of left hip, initial encounter: Secondary | ICD-10-CM | POA: Diagnosis not present

## 2014-11-17 DIAGNOSIS — R0602 Shortness of breath: Secondary | ICD-10-CM | POA: Diagnosis not present

## 2014-11-17 DIAGNOSIS — I4891 Unspecified atrial fibrillation: Secondary | ICD-10-CM | POA: Diagnosis not present

## 2014-11-17 DIAGNOSIS — M1990 Unspecified osteoarthritis, unspecified site: Secondary | ICD-10-CM

## 2014-11-17 DIAGNOSIS — W19XXXA Unspecified fall, initial encounter: Secondary | ICD-10-CM | POA: Diagnosis present

## 2014-11-17 DIAGNOSIS — M25552 Pain in left hip: Secondary | ICD-10-CM | POA: Diagnosis not present

## 2014-11-17 DIAGNOSIS — S0990XA Unspecified injury of head, initial encounter: Secondary | ICD-10-CM | POA: Diagnosis not present

## 2014-11-17 DIAGNOSIS — S8992XA Unspecified injury of left lower leg, initial encounter: Secondary | ICD-10-CM | POA: Diagnosis present

## 2014-11-17 DIAGNOSIS — R531 Weakness: Secondary | ICD-10-CM | POA: Diagnosis present

## 2014-11-17 HISTORY — DX: Unspecified injury of left lower leg, initial encounter: S89.92XA

## 2014-11-17 HISTORY — DX: Chronic atrial fibrillation, unspecified: I48.20

## 2014-11-17 LAB — CBC WITH DIFFERENTIAL/PLATELET
Basophils Absolute: 0.1 10*3/uL (ref 0.0–0.1)
Basophils Relative: 2 % — ABNORMAL HIGH (ref 0–1)
EOS PCT: 5 % (ref 0–5)
Eosinophils Absolute: 0.2 10*3/uL (ref 0.0–0.7)
HEMATOCRIT: 22.8 % — AB (ref 36.0–46.0)
Hemoglobin: 7.5 g/dL — ABNORMAL LOW (ref 12.0–15.0)
Lymphocytes Relative: 20 % (ref 12–46)
Lymphs Abs: 0.7 10*3/uL (ref 0.7–4.0)
MCH: 31.6 pg (ref 26.0–34.0)
MCHC: 32.9 g/dL (ref 30.0–36.0)
MCV: 96.2 fL (ref 78.0–100.0)
Monocytes Absolute: 0.3 10*3/uL (ref 0.1–1.0)
Monocytes Relative: 10 % (ref 3–12)
Neutro Abs: 2.1 10*3/uL (ref 1.7–7.7)
Neutrophils Relative %: 63 % (ref 43–77)
Platelets: 203 10*3/uL (ref 150–400)
RBC: 2.37 MIL/uL — AB (ref 3.87–5.11)
RDW: 27.1 % — AB (ref 11.5–15.5)
WBC: 3.4 10*3/uL — AB (ref 4.0–10.5)

## 2014-11-17 LAB — URINALYSIS, ROUTINE W REFLEX MICROSCOPIC
Bilirubin Urine: NEGATIVE
GLUCOSE, UA: NEGATIVE mg/dL
Hgb urine dipstick: NEGATIVE
Ketones, ur: NEGATIVE mg/dL
Leukocytes, UA: NEGATIVE
NITRITE: NEGATIVE
PH: 7.5 (ref 5.0–8.0)
Protein, ur: NEGATIVE mg/dL
SPECIFIC GRAVITY, URINE: 1.014 (ref 1.005–1.030)
Urobilinogen, UA: 1 mg/dL (ref 0.0–1.0)

## 2014-11-17 LAB — BASIC METABOLIC PANEL
ANION GAP: 9 (ref 5–15)
BUN: 32 mg/dL — AB (ref 6–23)
CO2: 26 mmol/L (ref 19–32)
CREATININE: 0.88 mg/dL (ref 0.50–1.10)
Calcium: 9.7 mg/dL (ref 8.4–10.5)
Chloride: 103 mmol/L (ref 96–112)
GFR calc non Af Amer: 55 mL/min — ABNORMAL LOW (ref >90)
GFR, EST AFRICAN AMERICAN: 64 mL/min — AB (ref >90)
Glucose, Bld: 126 mg/dL — ABNORMAL HIGH (ref 70–99)
POTASSIUM: 3.4 mmol/L — AB (ref 3.5–5.1)
Sodium: 138 mmol/L (ref 135–145)

## 2014-11-17 LAB — TROPONIN I: Troponin I: <0.03 ng/mL (ref <0.031)

## 2014-11-17 LAB — TSH: TSH: 4.777 u[IU]/mL — ABNORMAL HIGH (ref 0.350–4.500)

## 2014-11-17 MED ORDER — HEPARIN SODIUM (PORCINE) 5000 UNIT/ML IJ SOLN
5000.0000 [IU] | Freq: Three times a day (TID) | INTRAMUSCULAR | Status: DC
  Administered 2014-11-17 – 2014-11-18 (×3): 5000 [IU] via SUBCUTANEOUS
  Filled 2014-11-17 (×4): qty 1

## 2014-11-17 MED ORDER — SODIUM CHLORIDE 0.9 % IV BOLUS (SEPSIS)
250.0000 mL | Freq: Once | INTRAVENOUS | Status: AC
  Administered 2014-11-17: 250 mL via INTRAVENOUS

## 2014-11-17 MED ORDER — BRIMONIDINE TARTRATE 0.2 % OP SOLN
1.0000 [drp] | Freq: Two times a day (BID) | OPHTHALMIC | Status: DC
  Administered 2014-11-17 – 2014-11-19 (×4): 1 [drp] via OPHTHALMIC
  Filled 2014-11-17: qty 5

## 2014-11-17 MED ORDER — VITAMIN D3 25 MCG (1000 UNIT) PO TABS
1000.0000 [IU] | ORAL_TABLET | Freq: Every day | ORAL | Status: DC
  Administered 2014-11-17 – 2014-11-19 (×3): 1000 [IU] via ORAL
  Filled 2014-11-17 (×3): qty 1

## 2014-11-17 MED ORDER — DILTIAZEM HCL ER COATED BEADS 240 MG PO CP24
240.0000 mg | ORAL_CAPSULE | Freq: Every day | ORAL | Status: DC
  Administered 2014-11-17 – 2014-11-19 (×3): 240 mg via ORAL
  Filled 2014-11-17 (×4): qty 1

## 2014-11-17 MED ORDER — ASPIRIN EC 81 MG PO TBEC
81.0000 mg | DELAYED_RELEASE_TABLET | Freq: Every day | ORAL | Status: DC
  Administered 2014-11-17 – 2014-11-19 (×3): 81 mg via ORAL
  Filled 2014-11-17 (×3): qty 1

## 2014-11-17 MED ORDER — METOPROLOL TARTRATE 1 MG/ML IV SOLN
5.0000 mg | Freq: Four times a day (QID) | INTRAVENOUS | Status: DC
  Administered 2014-11-17 – 2014-11-18 (×3): 5 mg via INTRAVENOUS
  Filled 2014-11-17 (×3): qty 5

## 2014-11-17 MED ORDER — POLYETHYLENE GLYCOL 3350 17 G PO PACK
17.0000 g | PACK | Freq: Every day | ORAL | Status: DC | PRN
  Administered 2014-11-19: 17 g via ORAL
  Filled 2014-11-17: qty 1

## 2014-11-17 MED ORDER — LEVOTHYROXINE SODIUM 50 MCG PO TABS
50.0000 ug | ORAL_TABLET | Freq: Every day | ORAL | Status: DC
  Administered 2014-11-18 – 2014-11-19 (×2): 50 ug via ORAL
  Filled 2014-11-17 (×2): qty 1

## 2014-11-17 MED ORDER — SODIUM CHLORIDE 0.9 % IV BOLUS (SEPSIS)
500.0000 mL | Freq: Once | INTRAVENOUS | Status: AC
  Administered 2014-11-17: 500 mL via INTRAVENOUS

## 2014-11-17 MED ORDER — FOLIC ACID 0.5 MG HALF TAB
0.5000 mg | ORAL_TABLET | Freq: Two times a day (BID) | ORAL | Status: DC
  Administered 2014-11-17 – 2014-11-19 (×4): 0.5 mg via ORAL
  Filled 2014-11-17 (×5): qty 1

## 2014-11-17 MED ORDER — FENTANYL CITRATE 0.05 MG/ML IJ SOLN
25.0000 ug | Freq: Once | INTRAMUSCULAR | Status: AC
  Administered 2014-11-17: 25 ug via INTRAVENOUS
  Filled 2014-11-17: qty 2

## 2014-11-17 MED ORDER — FOLIC ACID 400 MCG PO TABS: 400.0000 ug | ORAL_TABLET | Freq: Two times a day (BID) | ORAL | Status: DC

## 2014-11-17 MED ORDER — TRAMADOL HCL 50 MG PO TABS
50.0000 mg | ORAL_TABLET | Freq: Four times a day (QID) | ORAL | Status: DC | PRN
Start: 2014-11-17 — End: 2014-11-19
  Administered 2014-11-17 – 2014-11-18 (×2): 50 mg via ORAL
  Filled 2014-11-17 (×2): qty 1

## 2014-11-17 MED ORDER — CARVEDILOL 6.25 MG PO TABS
6.2500 mg | ORAL_TABLET | Freq: Two times a day (BID) | ORAL | Status: DC
  Administered 2014-11-17 – 2014-11-18 (×2): 6.25 mg via ORAL
  Filled 2014-11-17 (×2): qty 1

## 2014-11-17 NOTE — Progress Notes (Signed)
  CARE MANAGEMENT ED NOTE 11/17/2014  Patient:  Monica Riggs, Monica Riggs   Account Number:  [id number]  Date Initiated:  11/17/2014  Documentation initiated by:  Jackelyn Poling  Subjective/Objective Assessment:   81 yr old [employer] tripped and fell onto the street yesterday as she was epmtying her trash cans.  She c/o left hip and knee pain.  No shortening or rotation of left leg/hip noted and she is in no distress.     Subjective/Objective Assessment Detail:   pcp Donald moore  Pt states she had home hospice but dismissed the services but has not had home health services before  [facility]  Daughter at bedside supportive States pt comes to stay with her in [place] area after treatments  Pt HOH but does not like wearing her hearing aids  DX afib RVR HR >100     Action/Plan:   Cm offered pt home health choices see notes below 1549 notified [facility] of [facility] to follow pt for d/c needs   Action/Plan Detail:   Anticipated DC Date:  11/20/2014     Status Recommendation to Physician:   Result of Recommendation:    Other ED Services  Consult Working [facility]  Other  Outpatient Services - Pt will follow up   [facility]   Choice offered to / List presented to:  C-1 Patient         [place].    Status of service:  Completed, signed off  ED Comments:   ED Comments Detail:  CM reviewed in details medicare guidelines, home health ([facility]) (length of stay in home, types of [facility] staff available, coverage, primary caregiver, up to 24 hrs before services may be started),   CM reviewed availability of [facility] to assist pcp to get pt to snf (if desired disposition) from the community level. CM provided family with a list of [facility] home health agencies,  Discussed pt to be further evaluated by unit therapists (PT/OT) for recommendation of level of care and share this with attending MD and unit CM

## 2014-11-17 NOTE — ED Notes (Signed)
Bed: GZ35 Expected date:  Expected time:  Means of arrival:  Comments: Hip pain from fall

## 2014-11-17 NOTE — H&P (Signed)
[facility] History and Physical  Monica Riggs:[id number] DOB: 1919/09/15 DOA: 11/17/2014  Referring physician: ED PCP: Redge Gainer, MD  Specialists: none  Chief Complaint: Fall  HPI: Monica Riggs is a 81 y.o. female known h/o, chronic Afib CHad2Vasc2 score<6, but not on Ac 2/2 to  Myelodysplasia ECoG 3 previously on Hospice for this [follows with Dr. Marin Olp and is transfusion dependant]came to [facility] ed 11/17/2014 with mechanical fall. The Fall occurred 1500 on 11/16/14 and her life alert alerted her daughter and granddaughter to this.  Family relates that the patient had recently been in discontinued off of some of her antihypertensive medications such as HCTZ 25 mg, Benicar 40 and Cardizem 300 mg as she was feeling somewhat dizzy however she continued taking them despite this Patient admits to feeling dizzy about half the time when she gets up from laying to sitting. She lives alone and has lived alone since Feb 10, 1989 when her husband passed away. She can do most things for self and is supposed to use a cane or a walker which she normally does not do. Patient denies any antecedent shortness of breath chest pain blurred vision double vision stroke like symptoms unilateral weakness or any other complaint She might have hit her head. She has not had any melena or any dark or tarry stools. She has a chronic history of atrial fibrillation dating back at least 2-3 years and is not on any anticoagulation because the risks with her history of myelodysplasia were too great to place her on anticoagulation She has not had any recent falls but does occasionally feel dizzy as dictated above No diarrheal illness or viral illness like symptoms   Review of Systems: The patient denies  Dysuria   nausea Vomiting Chest pain Rash Other issue  Past Medical History  Diagnosis Date  . Hyperlipidemia   . Hypertension   . Myelodysplasia   . Arthritis    No past surgical history on file. Social  History:  History   Social History Narrative    Allergies  Allergen Reactions  . Sulfa Antibiotics Rash    Family History  Problem Relation Age of Onset  . Stroke      Parents  . Breast cancer      Female members on the maternal side  . Breast cancer      Father    Prior to Admission medications   Medication Sig Start Date End Date Taking? Authorizing Provider  aspirin EC 81 MG tablet Take 81 mg by mouth daily.   Yes Historical Provider, MD  atorvastatin (LIPITOR) 80 MG tablet Take 40 mg by mouth daily.   Yes Historical Provider, MD  BENICAR 40 MG tablet TAKE ONE (1) TABLET EACH DAY 11/16/14  Yes [name], MD  brimonidine [facility]) 0.2 % ophthalmic solution Place 1 drop into both eyes 2 (two) times daily.   Yes Historical Provider, MD  cholecalciferol (VITAMIN D) 1000 UNITS tablet Take 1,000 Units by mouth daily.   Yes Historical Provider, MD  diltiazem (CARDIZEM CD) 300 MG 24 hr capsule TAKE ONE (1) CAPSULE EACH DAY 10/14/14  Yes [name], MD  folic acid (FOLVITE) 503 MCG tablet Take 400 mcg by mouth 2 (two) times daily.   Yes Historical Provider, MD  hydrochlorothiazide (HYDRODIURIL) 25 MG tablet TAKE ONE (1) TABLET EACH DAY 07/13/14  Yes [name], MD  levothyroxine (SYNTHROID, LEVOTHROID) 50 MCG tablet TAKE ONE (1) TABLET EACH DAY 06/11/14  Yes [name],  MD  Multiple Vitamin (MULTIVITAMIN WITH MINERALS) TABS tablet Take 1 tablet by mouth daily.   Yes Historical Provider, MD  Omega-3 Fatty Acids (FISH OIL) 1000 MG CAPS Take 1 capsule by mouth daily.   Yes Historical Provider, MD  polyethylene glycol (MIRALAX / GLYCOLAX) packet Take 17 g by mouth daily as needed for mild constipation.   Yes Historical Provider, MD   Physical Exam: Filed Vitals:   11/17/14 1120  BP: 146/71  Pulse: 130  Temp: 98.4 F (36.9 C)  TempSrc: Oral  Resp: 20  SpO2: 96%     General: EOMI NCAT  Eyes: No pallor no icterus  ENT: No JVD Neck is soft supple nontender  nondistended   chest is clinically clear with no added sound Abdomen is soft nontender no rebound no guarding There is some mottling of the skin over her left knee and range of motion is a little bit restricted No lower extremity swelling Neurologically grossly intact   Labs on Admission:  Basic Metabolic Panel:  Recent Labs Lab 11/17/14 1136  NA 138  K 3.4*  CL 103  CO2 26  GLUCOSE 126*  BUN 32*  CREATININE 0.88  CALCIUM 9.7   Liver Function Tests: No results for input(s): AST, ALT, ALKPHOS, BILITOT, PROT, ALBUMIN in the last 168 hours. No results for input(s): LIPASE, AMYLASE in the last 168 hours. No results for input(s): AMMONIA in the last 168 hours. CBC:  Recent Labs Lab 11/17/14 1136  WBC 3.4*  NEUTROABS 2.1  HGB 7.5*  HCT 22.8*  MCV 96.2  PLT 203   Cardiac Enzymes:  Recent Labs Lab 11/17/14 1136  TROPONINI <0.03    BNP (last 3 results) No results for input(s): BNP in the last 8760 hours.  ProBNP (last 3 results) No results for input(s): PROBNP in the last 8760 hours.  CBG: No results for input(s): GLUCAP in the last 168 hours.  Radiological Exams on Admission: Dg Chest 2 View  11/17/2014   CLINICAL DATA:  81 year old female who fell yesterday with shortness of breath and left hip pain. Initial encounter.  EXAM: CHEST  2 VIEW  COMPARISON:  10/01/2013.  FINDINGS: Semi upright AP and lateral views of the chest. Moderate right pleural effusion, increased from prior. Previously seen small left pleural effusion appears resolved. Chronic cardiomegaly. Stable cardiac size and mediastinal contours. No pneumothorax. No pulmonary edema. No definite consolidation. Calcified atherosclerosis of the aorta. Osteopenia and widespread degenerative changes in the spine.  IMPRESSION: 1. Moderate right pleural effusion, increased since 2014. 2. Previous see seen left pleural effusion appears resolved. 3. No other acute cardiopulmonary abnormality.   Electronically Signed    By: Lars Pinks M.D.   On: 11/17/2014 12:26   Ct Head Wo Contrast  11/17/2014   CLINICAL DATA:  [place] yesterday walking hitting left side of head. Patient fell again today. Initial encounter.  EXAM: CT HEAD WITHOUT CONTRAST  TECHNIQUE: Contiguous axial images were obtained from the base of the skull through the vertex without intravenous contrast.  COMPARISON:  None.  FINDINGS: There is no evidence of acute intracranial hemorrhage, mass lesion, brain edema or extra-axial fluid collection. The ventricles and subarachnoid spaces are appropriately sized for age. There is moderate periventricular and subcortical white matter disease, likely reflecting chronic small vessel ischemic change. No evidence of cortical infarct. Intracranial vascular calcifications noted.  The visualized paranasal sinuses and middle ears are clear. There is a small mastoid effusion on the right without coalescence. The calvarium is intact.  IMPRESSION: No acute intracranial or calvarial findings. Mild small vessel ischemic changes and small right mastoid effusion demonstrated.   Electronically Signed   By: Camie Patience M.D.   On: 11/17/2014 14:07   Dg Knee Complete 4 Views Left  11/17/2014   CLINICAL DATA:  Left knee pain since falling yesterday. Initial encounter.  EXAM: LEFT KNEE - COMPLETE 4+ VIEW  COMPARISON:  None.  FINDINGS: The bones appear mildly demineralized. There is no evidence of acute fracture or dislocation. The joint spaces are maintained. There is a moderate size knee joint effusion. Mild meniscal chondrocalcinosis noted.  IMPRESSION: Moderate-sized joint effusion may be a manifestation of internal derangement or occult injury. No displaced fractures demonstrated.   Electronically Signed   By: Camie Patience M.D.   On: 11/17/2014 12:23   Dg Hip Unilat With Pelvis 2-3 Views Left  11/17/2014   CLINICAL DATA:  81 year old female with left hip pain after a fall yesterday. Initial encounter.  EXAM: LEFT HIP (WITH PELVIS) 2-3 VIEWS   COMPARISON:  None.  FINDINGS: Osteopenia. Femoral heads are normally located. Hip joint spaces are relatively preserved. Pelvis appears intact. Grossly intact sacrum and SI joints. Lower lumbar disc and endplate degeneration. Proximal right femur appears grossly intact.  Proximal left femur appears intact.  IMPRESSION: No acute fracture or dislocation identified about the left hip or pelvis. If occult hip fracture is suspected or if the patient is unable to weightbear, MRI is the preferred modality for further evaluation.   Electronically Signed   By: Lars Pinks M.D.   On: 11/17/2014 12:23    EKG: Independently reviewed. Atrial fibrillation with RVR PR interval nonexistent as an atrial fib , QRS axis is -20 , atrial rate probably about 115 , no acute ST-T wave changes suggestive of ACS -rate related changes in V2   Assessment/Plan Principal Problem:   Fall (on)(from) incline, initial encounter-suspect an element of orthostasis as patient was told to discontinue multiple prior medications including her Benicar 40 as well as her HCTZ 25 mg and has persisted in taking them. We will get orthostatics from laying to sitting only and reassess. She will need education regarding walker and cane and we will get physical and occupational therapy to see the patient. See below discussion Active Problems:   Myelodysplasia-her hemoglobin is at her usual therapeutic range of 7-8 and we will follow blood counts in the morning. She will need close follow-up with her regular oncologist   Hyperlipemia-continue atorvastatin   Hypothyroid-continue Synthroid 50 g   Chronic atrial fibrillation with RVR-patient has not taken her medications and past day or day and a half and I suspect this is why she has rapid ventricular response. I've given her a slightly lower dose of her home dose 300 mg Cardizem 240 CD and we will see if this controls her rate. If not we will have to admit her to step down unit   Left knee injury-I  discussed with Mr. Doren Custard of [facility] orthopedics at phone number [phone], he states that there is no surgery that we would perform on this patient unless it was a tibial plateau fracture or any other type of issue. If the hemarthrosis proves bothersome in terms of pain we will reconsult them formally to see her  70 minutes Discussed with orthopedics on the telephone Discussed with patient and family members daughter as well as granddaughter at the bedside Inpatient Probably will need skilled nursing care placement  Verlon Au [facility] Pager [phone]  If 7PM-7AM, please contact night-coverage [email or web address] Password [facility] 11/17/2014, 2:32 PM

## 2014-11-17 NOTE — ED Provider Notes (Signed)
CSN: [id number]     Arrival date & time 11/17/14  1115 History   First MD Initiated Contact with Patient 11/17/14 1120     Chief Complaint  Patient presents with  . Fall     (Consider location/radiation/quality/duration/timing/severity/associated sxs/prior Treatment) Patient is a 81 y.o. female presenting with fall.  Fall Pertinent negatives include no chest pain, no abdominal pain and no shortness of breath.   patient states that she lost her balance and fell yesterday. She's complaining of pain in her left hip and left knee. Denies loss of consciousness. Denies head neck chest or abdomen pain. States she's been doing well otherwise. She states she was able to her yesterday but pain increased today and she has been unable to walk. No back pain. States she does not feel her heart racing or have any chest pain but her EKG and the monitor shows A. fib with RVR. History of atrial fibrillation. Patient is not on anticoagulation.  Past Medical History  Diagnosis Date  . Hyperlipidemia   . Hypertension   . Myelodysplasia   . Arthritis   . Chronic atrial fibrillation   . Left knee injury    No past surgical history on file. Family History  Problem Relation Age of Onset  . Stroke      Parents  . Breast cancer      Female members on the maternal side  . Breast cancer      Father   History  Substance Use Topics  . Smoking status: Never Smoker   . Smokeless tobacco: Never Used     Comment: never used tobacco  . Alcohol Use: No   OB History    No data available     Review of Systems  Constitutional: Negative for fever, appetite change and fatigue.  Respiratory: Negative for shortness of breath.   Cardiovascular: Negative for chest pain.  Gastrointestinal: Negative for abdominal pain.  Musculoskeletal: Negative for back pain and neck pain.       Left hip and left knee pain  Skin: Positive for wound.  Hematological: Bruises/bleeds easily.      Allergies  Sulfa  antibiotics  Home Medications   Prior to Admission medications   Medication Sig Start Date End Date Taking? Authorizing Provider  aspirin EC 81 MG tablet Take 81 mg by mouth daily.   Yes Historical Provider, MD  atorvastatin (LIPITOR) 80 MG tablet Take 40 mg by mouth daily.   Yes Historical Provider, MD  BENICAR 40 MG tablet TAKE ONE (1) TABLET EACH DAY 11/16/14  Yes [name], MD  brimonidine [facility]) 0.2 % ophthalmic solution Place 1 drop into both eyes 2 (two) times daily.   Yes Historical Provider, MD  cholecalciferol (VITAMIN D) 1000 UNITS tablet Take 1,000 Units by mouth daily.   Yes Historical Provider, MD  diltiazem (CARDIZEM CD) 300 MG 24 hr capsule TAKE ONE (1) CAPSULE EACH DAY 10/14/14  Yes [name], MD  folic acid (FOLVITE) 295 MCG tablet Take 400 mcg by mouth 2 (two) times daily.   Yes Historical Provider, MD  hydrochlorothiazide (HYDRODIURIL) 25 MG tablet TAKE ONE (1) TABLET EACH DAY 07/13/14  Yes [name], MD  levothyroxine (SYNTHROID, LEVOTHROID) 50 MCG tablet TAKE ONE (1) TABLET EACH DAY 06/11/14  Yes [name], MD  Multiple Vitamin (MULTIVITAMIN WITH MINERALS) TABS tablet Take 1 tablet by mouth daily.   Yes Historical Provider, MD  Omega-3 Fatty Acids (FISH OIL) 1000 MG CAPS Take 1 capsule  by mouth daily.   Yes Historical Provider, MD  polyethylene glycol (MIRALAX / GLYCOLAX) packet Take 17 g by mouth daily as needed for mild constipation.   Yes Historical Provider, MD   BP 140/96 mmHg  Pulse 114  Temp(Src) 98.4 F (36.9 C) (Oral)  Resp 16  SpO2 99% Physical Exam  Constitutional: She is oriented to person, place, and time. She appears well-developed and well-nourished.  HENT:  Head: Normocephalic and atraumatic.  Neck: Normal range of motion. Neck supple.  Cardiovascular:  Irregular tachycardia  Pulmonary/Chest: Effort normal and breath sounds normal. No respiratory distress. She has no wheezes. She has no rales.  Abdominal: Soft. Bowel sounds  are normal. She exhibits no distension. There is no tenderness. There is no rebound and no guarding.  Musculoskeletal: Normal range of motion. She exhibits tenderness.  Ecchymosis and tenderness to left knee anteriorly. Some tenderness over left hip laterally. Some pain with range of motion but range of motion grossly intact. No lumbar tenderness. No pelvic tenderness.  Neurological: She is alert and oriented to person, place, and time. No cranial nerve deficit.  Skin: Skin is warm and dry.  Psychiatric: She has a normal mood and affect. Her speech is normal.  Nursing note and vitals reviewed.   ED Course  Procedures (including critical care time) Labs Review Labs Reviewed  CBC WITH DIFFERENTIAL/PLATELET - Abnormal; Notable for the following:    WBC 3.4 (*)    RBC 2.37 (*)    Hemoglobin 7.5 (*)    HCT 22.8 (*)    RDW 27.1 (*)    Basophils Relative 2 (*)    All other components within normal limits  BASIC METABOLIC PANEL - Abnormal; Notable for the following:    Potassium 3.4 (*)    Glucose, Bld 126 (*)    BUN 32 (*)    GFR calc non Af Amer 55 (*)    GFR calc Af Amer 64 (*)    All other components within normal limits  TSH - Abnormal; Notable for the following:    TSH 4.777 (*)    All other components within normal limits  URINALYSIS, ROUTINE W REFLEX MICROSCOPIC  TROPONIN I    Imaging Review Dg Chest 2 View  11/17/2014   CLINICAL DATA:  81 year old female who fell yesterday with shortness of breath and left hip pain. Initial encounter.  EXAM: CHEST  2 VIEW  COMPARISON:  10/01/2013.  FINDINGS: Semi upright AP and lateral views of the chest. Moderate right pleural effusion, increased from prior. Previously seen small left pleural effusion appears resolved. Chronic cardiomegaly. Stable cardiac size and mediastinal contours. No pneumothorax. No pulmonary edema. No definite consolidation. Calcified atherosclerosis of the aorta. Osteopenia and widespread degenerative changes in the  spine.  IMPRESSION: 1. Moderate right pleural effusion, increased since 2014. 2. Previous see seen left pleural effusion appears resolved. 3. No other acute cardiopulmonary abnormality.   Electronically Signed   By: Lars Pinks M.D.   On: 11/17/2014 12:26   Ct Head Wo Contrast  11/17/2014   CLINICAL DATA:  [place] yesterday walking hitting left side of head. Patient fell again today. Initial encounter.  EXAM: CT HEAD WITHOUT CONTRAST  TECHNIQUE: Contiguous axial images were obtained from the base of the skull through the vertex without intravenous contrast.  COMPARISON:  None.  FINDINGS: There is no evidence of acute intracranial hemorrhage, mass lesion, brain edema or extra-axial fluid collection. The ventricles and subarachnoid spaces are appropriately sized for age. There is moderate  periventricular and subcortical white matter disease, likely reflecting chronic small vessel ischemic change. No evidence of cortical infarct. Intracranial vascular calcifications noted.  The visualized paranasal sinuses and middle ears are clear. There is a small mastoid effusion on the right without coalescence. The calvarium is intact.  IMPRESSION: No acute intracranial or calvarial findings. Mild small vessel ischemic changes and small right mastoid effusion demonstrated.   Electronically Signed   By: Camie Patience M.D.   On: 11/17/2014 14:07   Dg Knee Complete 4 Views Left  11/17/2014   CLINICAL DATA:  Left knee pain since falling yesterday. Initial encounter.  EXAM: LEFT KNEE - COMPLETE 4+ VIEW  COMPARISON:  None.  FINDINGS: The bones appear mildly demineralized. There is no evidence of acute fracture or dislocation. The joint spaces are maintained. There is a moderate size knee joint effusion. Mild meniscal chondrocalcinosis noted.  IMPRESSION: Moderate-sized joint effusion may be a manifestation of internal derangement or occult injury. No displaced fractures demonstrated.   Electronically Signed   By: Camie Patience M.D.   On:  11/17/2014 12:23   Dg Hip Unilat With Pelvis 2-3 Views Left  11/17/2014   CLINICAL DATA:  81 year old female with left hip pain after a fall yesterday. Initial encounter.  EXAM: LEFT HIP (WITH PELVIS) 2-3 VIEWS  COMPARISON:  None.  FINDINGS: Osteopenia. Femoral heads are normally located. Hip joint spaces are relatively preserved. Pelvis appears intact. Grossly intact sacrum and SI joints. Lower lumbar disc and endplate degeneration. Proximal right femur appears grossly intact.  Proximal left femur appears intact.  IMPRESSION: No acute fracture or dislocation identified about the left hip or pelvis. If occult hip fracture is suspected or if the patient is unable to weightbear, MRI is the preferred modality for further evaluation.   Electronically Signed   By: Lars Pinks M.D.   On: 11/17/2014 12:23     EKG Interpretation   Date/Time:  Wednesday November 17 2014 11:21:38 EST Ventricular Rate:  132 PR Interval:    QRS Duration: 78 QT Interval:  287 QTC Calculation: 425 R Axis:   -110 Text Interpretation:  Atrial fibrillation Left anterior fascicular block  Low voltage, extremity leads Abnormal R-wave progression, late transition  Abnormal lateral Q waves Confirmed by Alvino Chapel  MD, Ovid Curd [phone]) on  11/17/2014 11:29:17 AM      MDM   Final diagnoses:  Fall  Atrial fibrillation with RVR  Internal derangement of knee, left  Anemia, unspecified anemia type    Patient with fall. May be internal derangement left knee. Unable to walk on it due to the pain. Also found to be in A. fib with RVR. A. fib is chronic but does not normally have the RVR. Her hemoglobin is low but is appears to be near her baseline. Will admit to internal medicine for management of these problems.    Jasper Riling. Alvino Chapel, MD 11/17/14 [phone]

## 2014-11-17 NOTE — Progress Notes (Signed)
UR completed 

## 2014-11-17 NOTE — ED Notes (Signed)
She states she tripped and fell onto the street yesterday as she was epmtying her trash cans.  She c/o left hip and knee pain.  No shortening or rotation of left leg/hip noted and she is in no distress.

## 2014-11-17 NOTE — Progress Notes (Signed)
Choice offered [place] that are Medicare-Certified and affiliated with [facility]  Telephone Number Address  [facility] has ownership interest in this company; however, you are under no obligation to use this agency. [phone]  [address] [place], [place] [address] [email or web address]    Agencies that are Medicare-Certified and are not affiliated with [facility] Agency Telephone Number Address  [facility] [phone] Fax [phone] [place], [place]  [address] [email or web address]  [facility] [phone] or [phone] Fax [phone] [address] [place], [place]  [address]  [facility] [phone] [place], [place] [address] [email or web address]  [facility] [phone] or [phone] [address] [place], [place] [address]  [facility] [phone] or [phone] Fax number [phone] [place], [place]  [address] [email or web address]

## 2014-11-18 DIAGNOSIS — W102XXA Fall (on)(from) incline, initial encounter: Secondary | ICD-10-CM

## 2014-11-18 DIAGNOSIS — D469 Myelodysplastic syndrome, unspecified: Secondary | ICD-10-CM

## 2014-11-18 DIAGNOSIS — D649 Anemia, unspecified: Secondary | ICD-10-CM

## 2014-11-18 DIAGNOSIS — I482 Chronic atrial fibrillation: Secondary | ICD-10-CM

## 2014-11-18 LAB — CBC
HCT: 20.1 % — ABNORMAL LOW (ref 36.0–46.0)
HEMOGLOBIN: 6.7 g/dL — AB (ref 12.0–15.0)
MCH: 32.1 pg (ref 26.0–34.0)
MCHC: 33.3 g/dL (ref 30.0–36.0)
MCV: 96.2 fL (ref 78.0–100.0)
PLATELETS: 176 10*3/uL (ref 150–400)
RBC: 2.09 MIL/uL — ABNORMAL LOW (ref 3.87–5.11)
RDW: 27 % — ABNORMAL HIGH (ref 11.5–15.5)
WBC: 3.9 10*3/uL — ABNORMAL LOW (ref 4.0–10.5)

## 2014-11-18 LAB — COMPREHENSIVE METABOLIC PANEL
ALBUMIN: 3.1 g/dL — AB (ref 3.5–5.2)
ALT: 51 U/L — AB (ref 0–35)
AST: 37 U/L (ref 0–37)
Alkaline Phosphatase: 78 U/L (ref 39–117)
Anion gap: 8 (ref 5–15)
BILIRUBIN TOTAL: 0.9 mg/dL (ref 0.3–1.2)
BUN: 23 mg/dL (ref 6–23)
CALCIUM: 9.2 mg/dL (ref 8.4–10.5)
CO2: 26 mmol/L (ref 19–32)
CREATININE: 0.67 mg/dL (ref 0.50–1.10)
Chloride: 102 mmol/L (ref 96–112)
GFR, EST AFRICAN AMERICAN: 85 mL/min — AB (ref >90)
GFR, EST NON AFRICAN AMERICAN: 73 mL/min — AB (ref >90)
GLUCOSE: 125 mg/dL — AB (ref 70–99)
Potassium: 3 mmol/L — ABNORMAL LOW (ref 3.5–5.1)
SODIUM: 136 mmol/L (ref 135–145)
TOTAL PROTEIN: 6 g/dL (ref 6.0–8.3)

## 2014-11-18 LAB — PROTIME-INR
INR: 1.15 (ref 0.00–1.49)
Prothrombin Time: 14.8 seconds (ref 11.6–15.2)

## 2014-11-18 LAB — PREPARE RBC (CROSSMATCH)

## 2014-11-18 MED ORDER — DIPHENHYDRAMINE HCL 25 MG PO CAPS
25.0000 mg | ORAL_CAPSULE | Freq: Once | ORAL | Status: AC
  Administered 2014-11-18: 25 mg via ORAL
  Filled 2014-11-18: qty 1

## 2014-11-18 MED ORDER — VITAMINS A & D EX OINT
TOPICAL_OINTMENT | CUTANEOUS | Status: AC
  Administered 2014-11-18: 1
  Filled 2014-11-18: qty 5

## 2014-11-18 MED ORDER — ACETAMINOPHEN 325 MG PO TABS
650.0000 mg | ORAL_TABLET | Freq: Once | ORAL | Status: AC
  Administered 2014-11-18: 650 mg via ORAL
  Filled 2014-11-18: qty 2

## 2014-11-18 MED ORDER — POTASSIUM CHLORIDE CRYS ER 10 MEQ PO TBCR
30.0000 meq | EXTENDED_RELEASE_TABLET | Freq: Once | ORAL | Status: AC
  Administered 2014-11-18: 30 meq via ORAL
  Filled 2014-11-18 (×2): qty 1

## 2014-11-18 MED ORDER — SODIUM CHLORIDE 0.9 % IV SOLN
Freq: Once | INTRAVENOUS | Status: AC
  Administered 2014-11-18: 11:00:00 via INTRAVENOUS

## 2014-11-18 MED ORDER — FUROSEMIDE 10 MG/ML IJ SOLN
10.0000 mg | Freq: Once | INTRAMUSCULAR | Status: AC
  Administered 2014-11-18: 10 mg via INTRAVENOUS
  Filled 2014-11-18: qty 2

## 2014-11-18 MED ORDER — POTASSIUM CHLORIDE CRYS ER 20 MEQ PO TBCR
40.0000 meq | EXTENDED_RELEASE_TABLET | Freq: Once | ORAL | Status: AC
  Administered 2014-11-18: 40 meq via ORAL
  Filled 2014-11-18: qty 4

## 2014-11-18 NOTE — Consult Note (Signed)
Referral MD  Reason for Referral: Myelodysplasia   Chief Complaint  Patient presents with  . Fall  : I fell while taking out the garbage can.  HPI: Monica Riggs is well-known to me. She is a very nice 81 year old white female. She has myelodysplasia. She has chronic atrial fibrillation. We have been supporting her with blood transfusions. She's been getting transfused every 4-6 weeks. I think her last transfusion was back on December 23.  She apparently fell while taking out some garbage cans. She lives by herself. She is very independent.  When she was admitted, which was on February 3, her hemoglobin was 7.5.  She, thank you, did not break any bones. She had some knee x-rays done for the left knee which showed a moderate size joint effusion.  She had rapid atrial fibrillation. This is being monitored.  We were notified of her admission.  She is not hurting. She is tired. She feels that most of her problems are because she is 81 years old.  Her appetite has been okay. She never has had a big appetite.  She's had no nausea or vomiting. She's had no change in bowel or bladder habits.  We have got hospice involved in the past. She did not want hospice so they discharged her from their care.        Past Medical History  Diagnosis Date  . Hyperlipidemia   . Hypertension   . Myelodysplasia   . Arthritis   . Chronic atrial fibrillation   . Left knee injury   :  Past Surgical History  Procedure Laterality Date  . Abdominal hysterectomy    :   Current facility-administered medications:  .  0.9 %  sodium chloride infusion, , Intravenous, Once, Volanda Napoleon, MD .  aspirin EC tablet 81 mg, 81 mg, Oral, Daily, Nita Sells, MD, 81 mg at 11/17/14 1849 .  brimonidine (ALPHAGAN) 0.2 % ophthalmic solution 1 drop, 1 drop, Both Eyes, BID, Nita Sells, MD, 1 drop at 11/17/14 2330 .  carvedilol (COREG) tablet 6.25 mg, 6.25 mg, Oral, BID WC, Nita Sells, MD,  6.25 mg at 11/17/14 1743 .  cholecalciferol (VITAMIN D) tablet 1,000 Units, 1,000 Units, Oral, Daily, Nita Sells, MD, 1,000 Units at 11/17/14 1851 .  diltiazem (CARDIZEM CD) 24 hr capsule 240 mg, 240 mg, Oral, Daily, Nita Sells, MD, 240 mg at 11/17/14 1524 .  folic acid (FOLVITE) tablet 0.5 mg, 0.5 mg, Oral, BID, Nita Sells, MD, 0.5 mg at 11/17/14 2329 .  furosemide (LASIX) injection 10 mg, 10 mg, Intravenous, Once, Volanda Napoleon, MD .  heparin injection 5,000 Units, 5,000 Units, Subcutaneous, 3 times per day, Nita Sells, MD, 5,000 Units at 11/18/14 0650 .  levothyroxine (SYNTHROID, LEVOTHROID) tablet 50 mcg, 50 mcg, Oral, QAC breakfast, Nita Sells, MD .  metoprolol (LOPRESSOR) injection 5 mg, 5 mg, Intravenous, 4 times per day, Nita Sells, MD, 5 mg at 11/18/14 0546 .  polyethylene glycol (MIRALAX / GLYCOLAX) packet 17 g, 17 g, Oral, Daily PRN, Nita Sells, MD .  traMADol (ULTRAM) tablet 50 mg, 50 mg, Oral, Q6H PRN, Nita Sells, MD, 50 mg at 11/17/14 1743:  . sodium chloride   Intravenous Once  . aspirin EC  81 mg Oral Daily  . brimonidine  1 drop Both Eyes BID  . carvedilol  6.25 mg Oral BID WC  . cholecalciferol  1,000 Units Oral Daily  . diltiazem  240 mg Oral Daily  . folic acid  0.5 mg Oral  BID  . furosemide  10 mg Intravenous Once  . heparin  5,000 Units Subcutaneous 3 times per day  . levothyroxine  50 mcg Oral QAC breakfast  . metoprolol  5 mg Intravenous 4 times per day  :  Allergies  Allergen Reactions  . Sulfa Antibiotics Rash  :  Family History  Problem Relation Age of Onset  . Stroke      Parents  . Breast cancer      Female members on the maternal side  . Breast cancer      Father  :  History   Social History  . Marital Status: Widowed    Spouse Name: N/A    Number of Children: N/A  . Years of Education: N/A   Occupational History  . Not on file.   Social History Main Topics  .  Smoking status: Never Smoker   . Smokeless tobacco: Never Used     Comment: never used tobacco  . Alcohol Use: No  . Drug Use: No  . Sexual Activity: Not on file   Other Topics Concern  . Not on file   Social History Narrative   Lives currently alone in [place]   Never smoker never drinker   Used to work in [employer] and in a [place] 2 twins, 5 grandchildren, 8 great-grandchildren  :  Pertinent items are noted in HPI.  Exam: Patient Vitals for the past 24 hrs:  BP Temp Temp src Pulse Resp SpO2 Height Weight  11/18/14 0540 112/66 mmHg 97.8 F (36.6 C) Oral 86 16 98 % - -  11/17/14 2330 (!) 114/49 mmHg - - 83 - - - -  11/17/14 2144 113/62 mmHg 98.1 F (36.7 C) Oral 81 20 99 % - -  11/17/14 1654 - - - - - - 5\' 1"  (1.549 m) 115 lb 8 oz (52.39 kg)  11/17/14 1643 (!) 151/82 mmHg 98.1 F (36.7 C) Oral (!) 119 18 100 % - -  11/17/14 1546 140/96 mmHg - - 114 16 99 % - -  11/17/14 1545 - - - - - 91 % - -  11/17/14 1441 148/71 mmHg 98.4 F (36.9 C) Oral 112 16 95 % - -  11/17/14 1120 146/71 mmHg 98.4 F (36.9 C) Oral (!) 130 20 96 % - -    elderly, thin white female in no obvious distress. She has a brace on her left leg. Her vital signs show temperature 97.8. Pulse 86. Blood pressure 112/66. Head and neck exam shows no normocephalic atraumatic skull. She has no ocular or oral lesions. She has no palpable cervical or supraclavicular lymph nodes. Lungs show some slight decrease at the bases. Cardiac exam irregular rate and rhythm consistent with atrial fibrillation. She has a 1/6 systolic ejection murmur. Abdomen is soft. She has good bowel sounds.  She has no abdominal mass. There is no palpable liver or spleen tip. Extremities shows a brace on the left leg. Neurological exam is nonfocal.     Recent Labs  11/17/14 1136 11/18/14 0440  WBC 3.4* 3.9*  HGB 7.5* 6.7*  HCT 22.8* 20.1*  PLT 203 176    Recent Labs  11/17/14 1136 11/18/14 0440  NA 138 136  K 3.4* 3.0*  CL  103 102  CO2 26 26  GLUCOSE 126* 125*  BUN 32* 23  CREATININE 0.88 0.67  CALCIUM 9.7 9.2    Blood smear None  Path none   Assessment:  Monica Riggs is a  very nice 81 year old white female. I have known her for several years. She is doing pretty well from my point of view.  She will go ahead and be transfused. Her hemoglobin is down. I don't think this is a surprise. Any stress on her body well cause her hemoglobin to drop as her bone marrow slows down. It would be a lot easier just to transfuse her wash she is in the hospital.  Otherwise, there is really not much else to do with her. As always, she wants to go home. She feels she is able to go home. Her family has always wanted her to live with them. She just does not want to do that.  We will certainly follow along. Again, outside of a transfusion, there is not much that we have to do.  [place] 75:6-7

## 2014-11-18 NOTE — Progress Notes (Signed)
Clinical Social Work Department BRIEF PSYCHOSOCIAL ASSESSMENT 11/18/2014  Patient:  Monica Riggs, Monica Riggs     Account Number:  [id number]     Admit date:  11/17/2014  Clinical Social Worker:  Tilda Burrow, CLINICAL SOCIAL WORKER  Date/Time:  11/17/2014 11:45 AM  Referred by:  CSW  Date Referred:  11/17/2014 Referred for  Other - See comment   Other Referral:   Interview type:  Patient Other interview type:    PSYCHOSOCIAL DATA Living Status:  ALONE Admitted from facility:   Level of care:  Independent Living Primary support name:   Primary support relationship to patient:  CHILD, ADULT Degree of support available:   According to daughter, the patient's grandson stays at home with her 3 days out of the week. Daughter also seemed supportive. Patient stated that she was not interested in home health at this time.    CURRENT CONCERNS Current Concerns  Adjustment to Illness   Other Concerns:    SOCIAL WORK ASSESSMENT / PLAN CSW met with patient at bedside. Daughter was present. Patient confirms that she presents to [facility] due to falling while at home yesterday. Per note, patient complains of left hip and knee pain.      Patient states that she lives at home aline in [place]. However, according to daughter the patient's grandson stays with her 3 days out of the week.     Patient states that she was taking out the trash and fell in the driveway. Daughter stated that the neighbors usually assist and take the pt's trash to the driveway, but the patient wanted to do it herself yesterday.    Patieint informed CSW that prior to coming into the ED she has been able to complete her ADL's independently.  Patient states that she still currently drives.    Daughter appears to be a great support.   Assessment/plan status:  No Further Intervention Required Other assessment/ plan:   Information/referral to community resources:   Patient stated that she was not interested in  home homehealth  services. Patient states that she usually does everything independent.    PATIENT'S/FAMILY'S RESPONSE TO PLAN OF CARE: Daughter appears to be a great support. Daughter states that the patient still drives and that her grandson stays 3 days out of the week at home with her.       Willette Brace [phone] ED CSW 11/18/2014 12:03 AM

## 2014-11-18 NOTE — Evaluation (Addendum)
Physical Therapy Evaluation Patient Details Name: Monica Riggs MRN: [id number] DOB: 07-03-1919 Today's Date: 11/18/2014   History of Present Illness  81 y.o. female with myelodysplasia admitted with a fall. Pt injured L knee, x-rays showed joint effusion but were negative for fracture.   Clinical Impression  *Pt admitted with above diagnosis. Pt currently with functional limitations due to the deficits listed below (see PT Problem List). ** Pt will benefit from skilled PT to increase their independence and safety with mobility to allow discharge to the venue listed below.    Pt ambulated 42' with RW. L knee Pain limits activity tolerance. She plans to DC to her daughter's home where she'll have 24* assist.  **    Follow Up Recommendations Home health PT    Equipment Recommendations  None recommended by PT    Recommendations for Other Services OT consult     Precautions / Restrictions Precautions Precautions: Fall Required Braces or Orthoses: Knee Immobilizer - Left Restrictions Weight Bearing Restrictions: No      Mobility  Bed Mobility Overal bed mobility: Needs Assistance Bed Mobility: Supine to Sit     Supine to sit: Min assist;HOB elevated     General bed mobility comments: Min A for LLE  Transfers Overall transfer level: Needs assistance Equipment used: Rolling walker (2 wheeled) Transfers: Sit to/from Stand Sit to Stand: Min assist         General transfer comment: assist to rise/steady  Ambulation/Gait Ambulation/Gait assistance: Min guard Ambulation Distance (Feet): 75 Feet Assistive device: Rolling walker (2 wheeled) Gait Pattern/deviations: Step-to pattern   Gait velocity interpretation: Below normal speed for age/gender General Gait Details: cues for sequencing, distance limited by pain  Stairs            Wheelchair Mobility    Modified Rankin (Stroke Patients Only)       Balance Overall balance assessment: Needs assistance    Sitting balance-Leahy Scale: Good       Standing balance-Leahy Scale: Poor Standing balance comment: steady with RW                             Pertinent Vitals/Pain Pain Assessment: 0-10 Pain Score: 8  Pain Location: L knee with walking Pain Intervention(s): Limited activity within patient's tolerance;Monitored during session;Repositioned    Home Living Family/patient expects to be discharged to:: Private residence Living Arrangements: Alone Available Help at Discharge: Family;Available 24 hours/day Type of Home: House Home Access: Stairs to enter Entrance Stairs-Rails: Right Entrance Stairs-Number of Steps: 4 Home Layout: Two level;Able to live on main level with bedroom/bathroom Home Equipment: Gilford Rile - 2 wheels;Cane - single point      Prior Function Level of Independence: Independent with assistive device(s)         Comments: doesn't use AD, holds onto furniture, independent bathing/dressing, drives, lives alone but will DC to daughter's home (info above is for daughter's home)     Hand Dominance        Extremity/Trunk Assessment               Lower Extremity Assessment: LLE deficits/detail   LLE Deficits / Details: ankle WNL  Cervical / Trunk Assessment: Normal  Communication   Communication: HOH  Cognition Arousal/Alertness: Awake/alert Behavior During Therapy: WFL for tasks assessed/performed Overall Cognitive Status: Within Functional Limits for tasks assessed  General Comments      Exercises        Assessment/Plan    PT Assessment Patient needs continued PT services  PT Diagnosis Difficulty walking;Acute pain   PT Problem List Decreased activity tolerance;Decreased balance;Decreased knowledge of use of DME;Decreased mobility;Pain  PT Treatment Interventions DME instruction;Gait training;Stair training;Functional mobility training;Therapeutic activities;Patient/family education;Balance  training;Therapeutic exercise   PT Goals (Current goals can be found in the Care Plan section) Acute Rehab PT Goals Patient Stated Goal: return to living independently PT Goal Formulation: With patient/family Time For Goal Achievement: 12/02/14 Potential to Achieve Goals: Good    Frequency Min 3X/week   Barriers to discharge        Co-evaluation               End of Session Equipment Utilized During Treatment: Gait belt Activity Tolerance: Patient limited by pain Patient left: in chair;with call bell/phone within reach Nurse Communication: Mobility status         Time: 1430-1452 PT Time Calculation (min) (ACUTE ONLY): 22 min   Charges:   PT Evaluation $Initial PT Evaluation Tier I: 1 Procedure     PT G CodesPhilomena Doheny 11/18/2014, 3:03 PM [phone]

## 2014-11-18 NOTE — Progress Notes (Signed)
PT Cancellation Note  Patient Details Name: Monica Riggs MRN: [id number] DOB: September 23, 1919   Cancelled Treatment:    Reason Eval/Treat Not Completed: Medical issues which prohibited therapy (Hgb 6.7, first unit of blood just started. Will follow. )   Philomena Doheny 11/18/2014, 11:39 AM [phone]

## 2014-11-18 NOTE — Care Management Note (Addendum)
    Page 1 of 1   11/19/2014     1:44:29 PM CARE MANAGEMENT NOTE 11/19/2014  Patient:  Monica Riggs, Monica Riggs   Account Number:  [id number]  Date Initiated:  11/18/2014  Documentation initiated by:  Dessa Phi  Subjective/Objective Assessment:   81 y/o f admitted w/fall.myelodysplasia.     Action/Plan:   From home.   Anticipated DC Date:  11/20/2014   Anticipated DC Plan:  [place]  CM consult      Choice offered to / List presented to:  C-1 Patient        [place] arranged  [place] PT      [place].   Status of service:  Completed, signed off Medicare Important Message given?  YES (If response is "NO", the following Medicare IM given date fields will be blank) Date Medicare IM given:  11/19/2014 Medicare IM given by:  [facility] Date Additional Medicare IM given:   Additional Medicare IM given by:    Discharge Disposition:  [place]  Per UR Regulation:  Reviewed for med. necessity/level of care/duration of stay  If discussed at [place] of Stay Meetings, dates discussed:    Comments:  11/19/14 Dessa Phi RN BSN NCM [phone] [facility] orders placed.[place] aware of hhc orders, & d/c.No further d/c needs.  11/18/14 Dessa Phi RN BSN NCm [phone] [facility] chosen for [facility].Await final [place] orders.

## 2014-11-18 NOTE — Progress Notes (Signed)
Attempted to see pt.  Pts Hgb at 6.7 and pt receiving blood.  Will check back as schedule allows. Jinger Neighbors, [place] [phone]

## 2014-11-18 NOTE — Progress Notes (Addendum)
Patient ID: Monica Riggs, female   DOB: 12-07-1918, 81 y.o.   MRN: [id number]  [facility] PROGRESS NOTE  Monica Riggs JJO:[id number] DOB: 06/25/1919 DOA: 11/17/2014 PCP: Redge Gainer, MD   Brief narrative:    81 y.o. female with known h/o chronic Afib CHad2Vasc2 score<6, but not on Ac 2/2 to Myelodysplasia ECoG 3 previously on Hospice for this (follows with Dr. Marin Olp and is transfusion dependant] came to [facility] ED 11/17/2014 after an episode of mechanical fall that occurred one day prior to this admission. In ED, family related that the patient had recently some antihypertensive medications discontinued such as HCTZ 25 mg, Benicar 40 and Cardizem 300 mg as she was feeling somewhat dizzy however she continued taking them despite this. She lives alone and has lived alone since 1989-02-08 when her husband passed away.  Assessment/Plan:    Principal Problem:   Fall, initial encounter - likely precipitated by dehydration from blood loss anemia - will need PT evaluation once able to participate  Active Problems:   Acute on chronic blood loss anemia secondary to myelodysplasia - in process of transfusion - monitor closely - check post transfusion H/H   Chronic atrial fibrillation with RVR - 2.4 s pauses on tele, likely exacerbated by anemia  - hold coreg    HTN - on Coreg, Cardizem, Metoprolol but SBP in 90's this AM - will hold Coreg and metoprolol for now and resume when clinically indicated    Leukopenia - mild, secondary to myelodysplasia  - CBC in AM   Hypokalemia - supplement and repeat BMP in AM   Hypothyroidism  - continue synthroid    Severe PCM - in the context of chronic illness - advance diet as pt able to tolerate    L knee and hip pain - with moderate effusion in the left knee - pt reports pain is better - PT evaluation in AM - if pt unable to bear weight, will ask for MRI  SCD's for DVT prophylaxis due to high risk bleeding and recurrent blood loss anemia    Code Status: Full.  Family Communication:  plan of care discussed with the patient Disposition Plan: Not ready for d/c yet, needs transfusions and ensuring Hg stable post transfusion   IV access:  Peripheral IV  Procedures and diagnostic studies:    Dg Chest 2 View  11/17/2014   Moderate right pleural effusion, increased since 08-Feb-2013. Previous see seen left pleural effusion appears resolved.   Ct Head Wo Contrast  11/17/2014  No acute intracranial or calvarial findings. Mild small vessel ischemic changes and small right mastoid effusion demonstrated.     Dg Knee Complete 4 Views Left 11/17/2014  Moderate-sized joint effusion may be a manifestation of internal derangement or occult injury. No displaced fractures demonstrated.    Dg Hip Unilat With Pelvis 2-3 Views Left  11/17/2014  No acute fracture or dislocation identified about the left hip or pelvis. If occult hip fracture is suspected or if the patient is unable to weightbear, MRI is the preferred modality for further evaluation.     Medical Consultants:  Oncology   Other Consultants:  None  IAnti-Infectives:   None  Faye Ramsay, MD  [facility] Pager [phone]  If 7PM-7AM, please contact night-coverage [email or web address] Password [email or web address] 11/18/2014, 1:26 PM   LOS: 1 day   HPI/Subjective: No events overnight.   Objective: Filed Vitals:   11/17/14 2330 11/18/14 0540 11/18/14 1111 11/18/14 1140  BP: 114/49 112/66 95/45 92/40   Pulse: 83  86 73 71  Temp:  97.8 F (36.6 C) 97.9 F (36.6 C) 98.1 F (36.7 C)  TempSrc:  Oral Oral Oral  Resp:  16  16  Height:      Weight:      SpO2:  98% 100% 100%    Intake/Output Summary (Last 24 hours) at 11/18/14 1326 Last data filed at 11/18/14 1140  Gross per 24 hour  Intake    430 ml  Output      0 ml  Net    430 ml    Exam:   General:  Pt is alert, follows commands appropriately, not in acute distress  Cardiovascular: Irregular rate and rhythm, no rubs, no gallops  Respiratory:  Clear to auscultation bilaterally, no wheezing, no crackles, no rhonchi  Abdomen: Soft, non tender, non distended, bowel sounds present, no guarding  Extremities: No edema, pulses DP and PT palpable bilaterally  Neuro: Grossly nonfocal  Data Reviewed: Basic Metabolic Panel:  Recent Labs Lab 11/17/14 1136 11/18/14 0440  NA 138 136  K 3.4* 3.0*  CL 103 102  CO2 26 26  GLUCOSE 126* 125*  BUN 32* 23  CREATININE 0.88 0.67  CALCIUM 9.7 9.2   Liver Function Tests:  Recent Labs Lab 11/18/14 0440  AST 37  ALT 51*  ALKPHOS 78  BILITOT 0.9  PROT 6.0  ALBUMIN 3.1*   CBC:  Recent Labs Lab 11/17/14 1136 11/18/14 0440  WBC 3.4* 3.9*  NEUTROABS 2.1  --   HGB 7.5* 6.7*  HCT 22.8* 20.1*  MCV 96.2 96.2  PLT 203 176   Cardiac Enzymes:  Recent Labs Lab 11/17/14 1136  TROPONINI <0.03   Scheduled Meds: . aspirin EC  81 mg Oral Daily  . brimonidine  1 drop Both Eyes BID  . carvedilol  6.25 mg Oral BID WC  . cholecalciferol  1,000 Units Oral Daily  . diltiazem  240 mg Oral Daily  . folic acid  0.5 mg Oral BID  . furosemide  10 mg Intravenous Once  . heparin  5,000 Units Subcutaneous 3 times per day  . levothyroxine  50 mcg Oral QAC breakfast  . metoprolol  5 mg Intravenous 4 times per day   Continuous Infusions:

## 2014-11-18 NOTE — Progress Notes (Signed)
CRITICAL VALUE ALERT  Critical value received:  Hgb 6.7  Date of notification:  11/18/14  Time of notification:  0522  Critical value read back:Yes.    Nurse who received alert:  Virgina Norfolk  MD notified (1st page):  Baltazar Najjar  Time of first page:  0533  MD notified (2nd page):  Time of second page:  Responding MD:  Baltazar Najjar  Time MD responded:  [phone]

## 2014-11-19 DIAGNOSIS — D72818 Other decreased white blood cell count: Secondary | ICD-10-CM

## 2014-11-19 LAB — BASIC METABOLIC PANEL
ANION GAP: 5 (ref 5–15)
BUN: 36 mg/dL — AB (ref 6–23)
CHLORIDE: 102 mmol/L (ref 96–112)
CO2: 25 mmol/L (ref 19–32)
Calcium: 8.8 mg/dL (ref 8.4–10.5)
Creatinine, Ser: 0.95 mg/dL (ref 0.50–1.10)
GFR calc Af Amer: 58 mL/min — ABNORMAL LOW (ref >90)
GFR calc non Af Amer: 50 mL/min — ABNORMAL LOW (ref >90)
Glucose, Bld: 132 mg/dL — ABNORMAL HIGH (ref 70–99)
Potassium: 4.3 mmol/L (ref 3.5–5.1)
Sodium: 132 mmol/L — ABNORMAL LOW (ref 135–145)

## 2014-11-19 LAB — TYPE AND SCREEN
ABO/RH(D): O NEG
ANTIBODY SCREEN: NEGATIVE
Unit division: 0
Unit division: 0

## 2014-11-19 LAB — CBC
HCT: 28.5 % — ABNORMAL LOW (ref 36.0–46.0)
Hemoglobin: 9.6 g/dL — ABNORMAL LOW (ref 12.0–15.0)
MCH: 30.9 pg (ref 26.0–34.0)
MCHC: 33.7 g/dL (ref 30.0–36.0)
MCV: 91.6 fL (ref 78.0–100.0)
PLATELETS: 169 10*3/uL (ref 150–400)
RBC: 3.11 MIL/uL — ABNORMAL LOW (ref 3.87–5.11)
RDW: 24.1 % — AB (ref 11.5–15.5)
WBC: 4.4 10*3/uL (ref 4.0–10.5)

## 2014-11-19 MED ORDER — DILTIAZEM HCL ER COATED BEADS 240 MG PO CP24: ORAL_CAPSULE | ORAL | Status: DC

## 2014-11-19 NOTE — Discharge Instructions (Signed)

## 2014-11-19 NOTE — Progress Notes (Signed)
Physical Therapy Treatment Patient Details Name: Monica Riggs MRN: [id number] DOB: [birth year] Today's Date: 11/19/2014    History of Present Illness 81 y.o. female with myelodysplasia admitted with a fall.     PT Comments    Pt OOB in recliner with KI on.  Removed to check skin and reapllied.  Pt stated it was given to her a couple of days ago for support L knee.  Per chart review "no acute Fx" and WBAT.  Will assume KI is for comfort/support.   Assisted with amb in hallway using RW.  Pt tolerated well. Pt was able to WB functionally thru L LE and functionally amb.    Follow Up Recommendations  Home health PT     Equipment Recommendations       Recommendations for Other Services       Precautions / Restrictions Precautions Precautions: Fall Required Braces or Orthoses: Knee Immobilizer - Left (no clear order/reason for KI other than comfort/support while amb) Restrictions Weight Bearing Restrictions: No    Mobility  Bed Mobility               General bed mobility comments: Pt OOB in recliner  Transfers Overall transfer level: Needs assistance Equipment used: Rolling walker (2 wheeled) Transfers: Sit to/from Stand Sit to Stand: Supervision;Min guard         General transfer comment: good use of hands and safety cognition  Ambulation/Gait Ambulation/Gait assistance: Supervision;Min guard Ambulation Distance (Feet): 82 Feet Assistive device: Rolling walker (2 wheeled) Gait Pattern/deviations: Step-to pattern;Decreased stance time - left Gait velocity: decreased   General Gait Details: one VC for safety with turns and backward gait   Stairs            Wheelchair Mobility    Modified Rankin (Stroke Patients Only)       Balance                                    Cognition                            Exercises      General Comments        Pertinent Vitals/Pain Pain Assessment: 0-10 Pain Score: 6  Pain  Location: L knee while amb Pain Descriptors / Indicators: Sore;Tender Pain Intervention(s): Monitored during session;Repositioned    Home Living                      Prior Function            PT Goals (current goals can now be found in the care plan section) Progress towards PT goals: Progressing toward goals    Frequency  Min 3X/week    PT Plan      Co-evaluation             End of Session Equipment Utilized During Treatment: Gait belt Activity Tolerance: Patient tolerated treatment well;Patient limited by pain Patient left: in chair;with call bell/phone within reach     Time: 1050-1118 PT Time Calculation (min) (ACUTE ONLY): 28 min  Charges:  $Gait Training: 8-22 mins $Therapeutic Activity: 8-22 mins                    G Codes:      Rica Koyanagi  PTA [facility]  Acute  Rehab Pager  [phone]   

## 2014-11-19 NOTE — Progress Notes (Signed)
Monica Riggs   DOB:March 18, 1919   WU#:[id number]   GNF#:[id number]  Patient Care Team: [name], MD as PCP - General (Family Medicine) Helayne Seminole, PA-C as Physician Assistant (Hematology and Oncology)  Subjective: Patient seen and examined. Feeling better this morning, pain controlled with meds. Denies fevers, chills, night sweats, vision changes, or mucositis. Denies any respiratory complaints. Denies any chest pain or palpitations. Denies lower extremity swelling. Denies nausea, heartburn or change in bowel habits.Appetite is normal. Denies any dysuria. Denies abnormal skin rashes, or neuropathy. Denies any epistaxis, hematemesis, hematuria or hematochezia. Difficulty bearing weight, OT/PT involved.   Scheduled Meds: . aspirin EC  81 mg Oral Daily  . brimonidine  1 drop Both Eyes BID  . cholecalciferol  1,000 Units Oral Daily  . diltiazem  240 mg Oral Daily  . folic acid  0.5 mg Oral BID  . levothyroxine  50 mcg Oral QAC breakfast   Continuous Infusions:  PRN Meds:polyethylene glycol, traMADol   Objective:  Filed Vitals:   11/19/14 0546  BP: 123/61  Pulse: 86  Temp: 99.8 F (37.7 C)  Resp: 18      Intake/Output Summary (Last 24 hours) at 11/19/14 0827 Last data filed at 11/19/14 8469  Gross per 24 hour  Intake   1160 ml  Output    401 ml  Net    759 ml    ECOG PERFORMANCE STATUS: 3  GENERAL:alert, no distress and comfortable SKIN: skin color, texture, turgor are normal, no rashes or significant lesions EYES: normal, conjunctiva are pink and non-injected, sclera clear OROPHARYNX:no exudate, no erythema and lips, buccal mucosa, and tongue normal  NECK: supple, thyroid normal size, non-tender, without nodularity LYMPH:  no palpable lymphadenopathy in the cervical, axillary or inguinal LUNGS: clear to auscultation and percussion with normal breathing effort HEART: irregular rate & rhythm and no murmurs and no lower extremity edema ABDOMEN: soft, non-tender and  normal bowel sounds NEURO: no focal motor/sensory deficits    CBG (last 3)  No results for input(s): GLUCAP in the last 72 hours.   Labs:   Recent Labs Lab 11/17/14 1136 11/18/14 0440 11/19/14 0330  WBC 3.4* 3.9* 4.4  HGB 7.5* 6.7* 9.6*  HCT 22.8* 20.1* 28.5*  PLT 203 176 169  MCV 96.2 96.2 91.6  MCH 31.6 32.1 30.9  MCHC 32.9 33.3 33.7  RDW 27.1* 27.0* 24.1*  LYMPHSABS 0.7  --   --   MONOABS 0.3  --   --   EOSABS 0.2  --   --   BASOSABS 0.1  --   --      Chemistries:    Recent Labs Lab 11/17/14 1136 11/18/14 0440 11/19/14 0330  NA 138 136 132*  K 3.4* 3.0* 4.3  CL 103 102 102  CO2 26 26 25   GLUCOSE 126* 125* 132*  BUN 32* 23 36*  CREATININE 0.88 0.67 0.95  CALCIUM 9.7 9.2 8.8  AST  --  37  --   ALT  --  51*  --   ALKPHOS  --  78  --   BILITOT  --  0.9  --     GFR Estimated Creatinine Clearance: 27.9 mL/min (by C-G formula based on Cr of 0.95).  Liver Function Tests:  Recent Labs Lab 11/18/14 0440  AST 37  ALT 51*  ALKPHOS 78  BILITOT 0.9  PROT 6.0  ALBUMIN 3.1*   No results for input(s): LIPASE, AMYLASE in the last 168 hours. No results for input(s): AMMONIA  in the last 168 hours.  Urine Studies     Component Value Date/Time   COLORURINE YELLOW 11/17/2014 1151   APPEARANCEUR CLEAR 11/17/2014 1151   LABSPEC 1.014 11/17/2014 1151   PHURINE 7.5 11/17/2014 1151   GLUCOSEU NEGATIVE 11/17/2014 1151   HGBUR NEGATIVE 11/17/2014 1151   BILIRUBINUR NEGATIVE 11/17/2014 1151   KETONESUR NEGATIVE 11/17/2014 1151   PROTEINUR NEGATIVE 11/17/2014 1151   UROBILINOGEN 1.0 11/17/2014 1151   NITRITE NEGATIVE 11/17/2014 1151   LEUKOCYTESUR NEGATIVE 11/17/2014 1151    Coagulation profile  Recent Labs Lab 11/18/14 0440  INR 1.15    Cardiac Enzymes:  Recent Labs Lab 11/17/14 1136  TROPONINI <0.03    Recent Labs  11/17/14 1136  TSH 4.777*   Microbiology No results found for this or any previous visit (from the past 240  hour(s)).     Imaging Studies:  Dg Chest 2 View  11/17/2014   CLINICAL DATA:  81 year old female who fell yesterday with shortness of breath and left hip pain. Initial encounter.  EXAM: CHEST  2 VIEW  COMPARISON:  10/01/2013.  FINDINGS: Semi upright AP and lateral views of the chest. Moderate right pleural effusion, increased from prior. Previously seen small left pleural effusion appears resolved. Chronic cardiomegaly. Stable cardiac size and mediastinal contours. No pneumothorax. No pulmonary edema. No definite consolidation. Calcified atherosclerosis of the aorta. Osteopenia and widespread degenerative changes in the spine.  IMPRESSION: 1. Moderate right pleural effusion, increased since 2014. 2. Previous see seen left pleural effusion appears resolved. 3. No other acute cardiopulmonary abnormality.   Electronically Signed   By: Lars Pinks M.D.   On: 11/17/2014 12:26   Ct Head Wo Contrast  11/17/2014   CLINICAL DATA:  [place] yesterday walking hitting left side of head. Patient fell again today. Initial encounter.  EXAM: CT HEAD WITHOUT CONTRAST  TECHNIQUE: Contiguous axial images were obtained from the base of the skull through the vertex without intravenous contrast.  COMPARISON:  None.  FINDINGS: There is no evidence of acute intracranial hemorrhage, mass lesion, brain edema or extra-axial fluid collection. The ventricles and subarachnoid spaces are appropriately sized for age. There is moderate periventricular and subcortical white matter disease, likely reflecting chronic small vessel ischemic change. No evidence of cortical infarct. Intracranial vascular calcifications noted.  The visualized paranasal sinuses and middle ears are clear. There is a small mastoid effusion on the right without coalescence. The calvarium is intact.  IMPRESSION: No acute intracranial or calvarial findings. Mild small vessel ischemic changes and small right mastoid effusion demonstrated.   Electronically Signed   By: Camie Patience M.D.   On: 11/17/2014 14:07   Dg Knee Complete 4 Views Left  11/17/2014   CLINICAL DATA:  Left knee pain since falling yesterday. Initial encounter.  EXAM: LEFT KNEE - COMPLETE 4+ VIEW  COMPARISON:  None.  FINDINGS: The bones appear mildly demineralized. There is no evidence of acute fracture or dislocation. The joint spaces are maintained. There is a moderate size knee joint effusion. Mild meniscal chondrocalcinosis noted.  IMPRESSION: Moderate-sized joint effusion may be a manifestation of internal derangement or occult injury. No displaced fractures demonstrated.   Electronically Signed   By: Camie Patience M.D.   On: 11/17/2014 12:23   Dg Hip Unilat With Pelvis 2-3 Views Left  11/17/2014   CLINICAL DATA:  81 year old female with left hip pain after a fall yesterday. Initial encounter.  EXAM: LEFT HIP (WITH PELVIS) 2-3 VIEWS  COMPARISON:  None.  FINDINGS: Osteopenia.  Femoral heads are normally located. Hip joint spaces are relatively preserved. Pelvis appears intact. Grossly intact sacrum and SI joints. Lower lumbar disc and endplate degeneration. Proximal right femur appears grossly intact.  Proximal left femur appears intact.  IMPRESSION: No acute fracture or dislocation identified about the left hip or pelvis. If occult hip fracture is suspected or if the patient is unable to weightbear, MRI is the preferred modality for further evaluation.   Electronically Signed   By: Lars Pinks M.D.   On: 11/17/2014 12:23    Assessment/Plan: 81 y.o.   Myelodysplasia  She is transfusion dependent, receiving blood every 4 to 6 weeks. Hb on admission 2/3  was 7.5 then dropping to 6.7 She received 2 units on 2/4 with good response No transfusion is indicated at this time Monitor counts closely Transfuse blood to maintain a Hb of 8 g or if the patient is acutely bleeding  Leukopenia Due to MDS, no intervention indicated at this time. Continue to closely monitor  Mechanical Fall Left Knee and Hip pain   X rays are negative for fracture MRI pending Continue supportive care Appreciate primary and OT/PT team involvement  DVT prophylaxis On SCD's  Full Code   Other medical issues including A fib, hypothyroidism as per admitting team     **Disclaimer: This note was dictated with voice recognition software. Similar sounding words can inadvertently be transcribed and this note may contain transcription errors which may not have been corrected upon publication of note.** WERTMAN,SARA E, PA-C 11/19/2014  8:27 AM   ADDENDUM: I agree with the above note. She tolerated her blood very well. Her hemoglobin went up to 9.6.  There is not much else that we have to do right now.  We will follow her along as needed.  Hopefully, she will be able to to go home soon.  Laurey Arrow

## 2014-11-19 NOTE — Discharge Summary (Signed)
Physician Discharge Summary  KALEIA LONGHI ZTI:[id number] DOB: Sep 04, 1919 DOA: 11/17/2014  PCP: Redge Gainer, MD  Admit date: 11/17/2014 Discharge date: 11/19/2014  Recommendations for Outpatient Follow-up:  1. Pt will need to follow up with PCP in 2-3 weeks post discharge 2. Please also check CBC to evaluate Hg and Hct levels  Discharge Diagnoses:  Principal Problem:   Fall (on)(from) incline, initial encounter Active Problems:   Myelodysplasia   Hyperlipemia   Hypothyroid   Chronic atrial fibrillation with RVR   Left knee injury   Absolute anemia  Discharge Condition: Stable  Diet recommendation: Heart healthy diet discussed in details   Brief narrative:    81 y.o. female with known h/o chronic Afib CHad2Vasc2 score<6, but not on Ac 2/2 to Myelodysplasia ECoG 3 previously on Hospice for this (follows with Dr. Marin Olp and is transfusion dependant] came to [facility] ED 11/17/2014 after an episode of mechanical fall that occurred one day prior to this admission. In ED, family related that the patient had recently some antihypertensive medications discontinued such as HCTZ 25 mg, Benicar 40 and Cardizem 300 mg as she was feeling somewhat dizzy however she continued taking them despite this. She lives alone and has lived alone since February 08, 1989 when her husband passed away.  Assessment/Plan:    Principal Problem:  Fall, initial encounter - likely precipitated by dehydration from blood loss anemia - ambulating with PT today with no concerns  Active Problems:  Acute on chronic blood loss anemia secondary to myelodysplasia - appropriate increase in Hg post transfusion, pt wants to go home today   Chronic atrial fibrillation with RVR - 2.4 s pauses on tele, likely exacerbated by anemia  - no further events on tele - pt denies chest pain or shortness of breath this AM, wants to go home   HTN - on Coreg, Cardizem, Metoprolol  - stable BP on discharge - pt advised to check BP regularly  and to call PCP if BP persistently < 100, may need to hold some of the med's   Leukopenia - mild, secondary to myelodysplasia   Hypokalemia - supplemented and WNL this AM  Hypothyroidism  - continue synthroid   Severe PCM - in the context of chronic illness - pt tolerating diet well   L knee and hip pain - with moderate effusion in the left knee - pt reports pain is better - pt able to bear weight  - requested HH PT  Code Status: Full.  Family Communication: plan of care discussed with the patient Disposition Plan: Home   IV access:  Peripheral IV  Procedures and diagnostic studies:   Dg Chest 2 View 11/17/2014 Moderate right pleural effusion, increased since 2013/02/08. Previous see seen left pleural effusion appears resolved.   Ct Head Wo Contrast 11/17/2014 No acute intracranial or calvarial findings. Mild small vessel ischemic changes and small right mastoid effusion demonstrated.   Dg Knee Complete 4 Views Left 11/17/2014 Moderate-sized joint effusion may be a manifestation of internal derangement or occult injury. No displaced fractures demonstrated.   Dg Hip Unilat With Pelvis 2-3 Views Left 11/17/2014 No acute fracture or dislocation identified about the left hip or pelvis. If occult hip fracture is suspected or if the patient is unable to weightbear, MRI is the preferred modality for further evaluation.   Medical Consultants:  Oncology   Other Consultants:  None  IAnti-Infectives:   None   Discharge Exam: Filed Vitals:   11/19/14 1014  BP: 129/60  Pulse: 83  Temp:   Resp:    Filed Vitals:   11/18/14 1825 11/18/14 2112 11/19/14 0546 11/19/14 1014  BP: 120/57 116/55 123/61 129/60  Pulse: 59 81 86 83  Temp: 97.9 F (36.6 C) 98.3 F (36.8 C) 99.8 F (37.7 C)   TempSrc: Oral Oral Oral   Resp: 18 17 18    Height:      Weight:      SpO2: 96% 98% 97% 94%    General: Pt is alert, follows commands appropriately, not in acute  distress Cardiovascular: Regular rate and rhythm, no rubs, no gallops Respiratory: Clear to auscultation bilaterally, no wheezing, no crackles, no rhonchi Abdominal: Soft, non tender, non distended, bowel sounds +, no guarding Extremities: no edema, no cyanosis, pulses palpable bilaterally DP and PT Neuro: Grossly nonfocal  Discharge Instructions  Discharge Instructions    Diet - low sodium heart healthy    Complete by:  As directed      Increase activity slowly    Complete by:  As directed             Medication List    TAKE these medications        aspirin EC 81 MG tablet  Take 81 mg by mouth daily.     atorvastatin 80 MG tablet  Commonly known as:  LIPITOR  Take 40 mg by mouth daily.     BENICAR 40 MG tablet  Generic drug:  olmesartan  TAKE ONE (1) TABLET EACH DAY     brimonidine 0.2 % ophthalmic solution  Commonly known as:  ALPHAGAN  Place 1 drop into both eyes 2 (two) times daily.     cholecalciferol 1000 UNITS tablet  Commonly known as:  VITAMIN D  Take 1,000 Units by mouth daily.     diltiazem 240 MG 24 hr capsule  Commonly known as:  CARDIZEM CD  TAKE ONE (1) CAPSULE EACH DAY     Fish Oil 1000 MG Caps  Take 1 capsule by mouth daily.     folic acid 161 MCG tablet  Commonly known as:  FOLVITE  Take 400 mcg by mouth 2 (two) times daily.     hydrochlorothiazide 25 MG tablet  Commonly known as:  HYDRODIURIL  TAKE ONE (1) TABLET EACH DAY     levothyroxine 50 MCG tablet  Commonly known as:  SYNTHROID, LEVOTHROID  TAKE ONE (1) TABLET EACH DAY     multivitamin with minerals Tabs tablet  Take 1 tablet by mouth daily.     polyethylene glycol packet  Commonly known as:  MIRALAX / GLYCOLAX  Take 17 g by mouth daily as needed for mild constipation.           Follow-up Information    Follow up with Redge Gainer, MD.   Specialty:  [facility]   Contact information:   [place] [id number] [phone]        The results  of significant diagnostics from this hospitalization (including imaging, microbiology, ancillary and laboratory) are listed below for reference.     Microbiology: No results found for this or any previous visit (from the past 240 hour(s)).   Labs: Basic Metabolic Panel:  Recent Labs Lab 11/17/14 1136 11/18/14 0440 11/19/14 0330  NA 138 136 132*  K 3.4* 3.0* 4.3  CL 103 102 102  CO2 26 26 25   GLUCOSE 126* 125* 132*  BUN 32* 23 36*  CREATININE 0.88 0.67 0.95  CALCIUM 9.7 9.2 8.8   Liver  Function Tests:  Recent Labs Lab 11/18/14 0440  AST 37  ALT 51*  ALKPHOS 78  BILITOT 0.9  PROT 6.0  ALBUMIN 3.1*   CBC:  Recent Labs Lab 11/17/14 1136 11/18/14 0440 11/19/14 0330  WBC 3.4* 3.9* 4.4  NEUTROABS 2.1  --   --   HGB 7.5* 6.7* 9.6*  HCT 22.8* 20.1* 28.5*  MCV 96.2 96.2 91.6  PLT 203 176 169   Cardiac Enzymes:  Recent Labs Lab 11/17/14 1136  TROPONINI <0.03   SIGNED: Time coordinating discharge: Over 30 minutes  Faye Ramsay, MD  [facility] 11/19/2014, 12:59 PM Pager [phone]  If 7PM-7AM, please contact night-coverage [email or web address] Password [email or web address]

## 2014-11-19 NOTE — Evaluation (Signed)
Occupational Therapy Evaluation Patient Details Name: Monica Riggs MRN: [id number] DOB: 1919/10/11 Today's Date: 11/19/2014    History of Present Illness 81 y.o. female with myelodysplasia admitted with a fall.    Clinical Impression   Pt was admitted for the above.  She is mod I at baseline, including IADLs.  Pt currently needs overall min A for adls.  She plans to return to granddaughter's home.  Goals in acute are for overall supervision level.  LB dressing not set as pt is wearing KI:  Will need assistance initially    Follow Up Recommendations  Home health OT;Supervision/Assistance - 24 hour    Equipment Recommendations  None recommended by OT    Recommendations for Other Services       Precautions / Restrictions Precautions Precautions: Fall Required Braces or Orthoses: Knee Immobilizer - Left Restrictions Weight Bearing Restrictions: No      Mobility Bed Mobility               General bed mobility comments: Pt OOB in recliner  Transfers Overall transfer level: Needs assistance Equipment used: Rolling walker (2 wheeled) Transfers: Sit to/from Stand Sit to Stand: Min assist Stand pivot transfers: Min guard       General transfer comment: light boost to stand:  pt was stiff afer sitting.      Balance                                            ADL Overall ADL's : Needs assistance/impaired             Lower Body Bathing: Minimal assistance;Sit to/from stand       Lower Body Dressing: Minimal assistance;Sit to/from stand   Toilet Transfer: Stand-pivot;Min guard (light min A to stand; min guard for SPT to bed from recliner)             General ADL Comments: pt is able to complete UB adls and grooming from seated position, with set up.  She wanted to get back to bed and did not need to use commode prior to this.       Vision                     Perception     Praxis      Pertinent Vitals/Pain Pain  Assessment: 0-10 Pain Score: 5  (with weightbearing) Pain Location: L knee Pain Descriptors / Indicators: Sore;Tender Pain Intervention(s): Limited activity within patient's tolerance;Monitored during session;Premedicated before session;Repositioned     Hand Dominance     Extremity/Trunk Assessment Upper Extremity Assessment Upper Extremity Assessment: Overall WFL for tasks assessed           Communication Communication Communication: HOH   Cognition Arousal/Alertness: Awake/alert Behavior During Therapy: WFL for tasks assessed/performed Overall Cognitive Status: Within Functional Limits for tasks assessed                     General Comments       Exercises       Shoulder Instructions      Home Living                                   Additional Comments: plans to go to grandaughters  pt has a 3:1 commode and  sponge bathes at baseline.  Pt was very independent prior to admission:  She prepares own food, takes trash out, etc.      Prior Functioning/Environment Level of Independence: Independent with assistive device(s)--did not use AD but held onto furniture             OT Diagnosis: Generalized weakness   OT Problem List: Decreased strength;Decreased activity tolerance;Impaired balance (sitting and/or standing);Pain   OT Treatment/Interventions: Self-care/ADL training;Energy conservation;Patient/family education;Balance training    OT Goals(Current goals can be found in the care plan section) Acute Rehab OT Goals Patient Stated Goal: return to living independently OT Goal Formulation: With patient Time For Goal Achievement: 11/26/14 Potential to Achieve Goals: Good ADL Goals Pt Will Perform Lower Body Bathing: with supervision;with adaptive equipment;sit to/from stand Pt Will Transfer to Toilet: with supervision;ambulating;bedside commode Pt Will Perform Toileting - Clothing Manipulation and hygiene: with supervision;sit to/from  stand  OT Frequency: Min 2X/week   Barriers to D/C:            Co-evaluation              End of Session    Activity Tolerance: Patient tolerated treatment well Patient left: in bed;with bed alarm set;with call bell/phone within reach   Time: 1300-1313 OT Time Calculation (min): 13 min Charges:  OT General Charges $OT Visit: 1 Procedure OT Evaluation $Initial OT Evaluation Tier I: 1 Procedure G-Codes:    Monica Riggs 12/01/14, 1:47 PM  Lesle Chris, OTR/L [phone] 2014-12-01

## 2014-11-20 ENCOUNTER — Telehealth: Payer: Self-pay | Admitting: *Deleted

## 2014-11-20 NOTE — Telephone Encounter (Signed)
lmom 

## 2014-11-23 DIAGNOSIS — I10 Essential (primary) hypertension: Secondary | ICD-10-CM | POA: Diagnosis not present

## 2014-11-23 DIAGNOSIS — E038 Other specified hypothyroidism: Secondary | ICD-10-CM | POA: Diagnosis not present

## 2014-11-23 DIAGNOSIS — D469 Myelodysplastic syndrome, unspecified: Secondary | ICD-10-CM | POA: Diagnosis not present

## 2014-11-23 DIAGNOSIS — I4891 Unspecified atrial fibrillation: Secondary | ICD-10-CM | POA: Diagnosis not present

## 2014-11-23 DIAGNOSIS — Z9181 History of falling: Secondary | ICD-10-CM | POA: Diagnosis not present

## 2014-11-25 DIAGNOSIS — Z9181 History of falling: Secondary | ICD-10-CM | POA: Diagnosis not present

## 2014-11-25 DIAGNOSIS — D469 Myelodysplastic syndrome, unspecified: Secondary | ICD-10-CM | POA: Diagnosis not present

## 2014-11-25 DIAGNOSIS — I4891 Unspecified atrial fibrillation: Secondary | ICD-10-CM | POA: Diagnosis not present

## 2014-11-25 DIAGNOSIS — I10 Essential (primary) hypertension: Secondary | ICD-10-CM | POA: Diagnosis not present

## 2014-11-25 DIAGNOSIS — E038 Other specified hypothyroidism: Secondary | ICD-10-CM | POA: Diagnosis not present

## 2014-11-29 ENCOUNTER — Telehealth: Payer: Self-pay | Admitting: Hematology & Oncology

## 2014-11-29 NOTE — Telephone Encounter (Signed)
Sister moved 2-16 to 3-7, per MD wants to see in 3 weeks

## 2014-11-30 ENCOUNTER — Ambulatory Visit: Admitting: Hematology & Oncology

## 2014-11-30 ENCOUNTER — Other Ambulatory Visit: Admitting: Lab

## 2014-12-01 DIAGNOSIS — Z9181 History of falling: Secondary | ICD-10-CM | POA: Diagnosis not present

## 2014-12-01 DIAGNOSIS — E038 Other specified hypothyroidism: Secondary | ICD-10-CM | POA: Diagnosis not present

## 2014-12-01 DIAGNOSIS — D469 Myelodysplastic syndrome, unspecified: Secondary | ICD-10-CM | POA: Diagnosis not present

## 2014-12-01 DIAGNOSIS — I10 Essential (primary) hypertension: Secondary | ICD-10-CM | POA: Diagnosis not present

## 2014-12-01 DIAGNOSIS — I4891 Unspecified atrial fibrillation: Secondary | ICD-10-CM | POA: Diagnosis not present

## 2014-12-03 DIAGNOSIS — I10 Essential (primary) hypertension: Secondary | ICD-10-CM | POA: Diagnosis not present

## 2014-12-03 DIAGNOSIS — Z9181 History of falling: Secondary | ICD-10-CM | POA: Diagnosis not present

## 2014-12-03 DIAGNOSIS — D469 Myelodysplastic syndrome, unspecified: Secondary | ICD-10-CM | POA: Diagnosis not present

## 2014-12-03 DIAGNOSIS — I4891 Unspecified atrial fibrillation: Secondary | ICD-10-CM | POA: Diagnosis not present

## 2014-12-03 DIAGNOSIS — E038 Other specified hypothyroidism: Secondary | ICD-10-CM | POA: Diagnosis not present

## 2014-12-20 ENCOUNTER — Ambulatory Visit (HOSPITAL_BASED_OUTPATIENT_CLINIC_OR_DEPARTMENT_OTHER): Payer: Medicare Other | Admitting: Hematology & Oncology

## 2014-12-20 ENCOUNTER — Encounter: Payer: Self-pay | Admitting: Hematology & Oncology

## 2014-12-20 ENCOUNTER — Other Ambulatory Visit (HOSPITAL_BASED_OUTPATIENT_CLINIC_OR_DEPARTMENT_OTHER): Payer: Medicare Other | Admitting: Lab

## 2014-12-20 VITALS — BP 108/45 | HR 75 | Temp 97.4°F | Resp 14 | Ht 61.0 in | Wt 116.0 lb

## 2014-12-20 DIAGNOSIS — D469 Myelodysplastic syndrome, unspecified: Secondary | ICD-10-CM

## 2014-12-20 DIAGNOSIS — D4620 Refractory anemia with excess of blasts, unspecified: Secondary | ICD-10-CM

## 2014-12-20 DIAGNOSIS — D464 Refractory anemia, unspecified: Secondary | ICD-10-CM | POA: Diagnosis not present

## 2014-12-20 LAB — MANUAL DIFFERENTIAL (CHCC SATELLITE)
ALC: 1.4 10*3/uL (ref 0.6–2.2)
ANC (CHCC MAN DIFF): 2.9 10*3/uL (ref 1.5–6.7)
BASO: 3 % — ABNORMAL HIGH (ref 0–2)
Band Neutrophils: 5 % (ref 0–10)
Eos: 10 % — ABNORMAL HIGH (ref 0–7)
LYMPH: 27 % (ref 14–48)
METAMYELOCYTES PCT: 3 % — AB (ref 0–0)
MONO: 1 % (ref 0–13)
PLT EST ~~LOC~~: ADEQUATE
SEG: 49 % (ref 40–75)

## 2014-12-20 LAB — CBC WITH DIFFERENTIAL (CANCER CENTER ONLY)
HEMATOCRIT: 22.2 % — AB (ref 34.8–46.6)
HGB: 7 g/dL — ABNORMAL LOW (ref 11.6–15.9)
MCH: 31.4 pg (ref 26.0–34.0)
MCHC: 31.5 g/dL — ABNORMAL LOW (ref 32.0–36.0)
MCV: 100 fL (ref 81–101)
Platelets: 187 10*3/uL (ref 145–400)
RBC: 2.23 10*6/uL — ABNORMAL LOW (ref 3.70–5.32)
RDW: 25.4 % — AB (ref 11.1–15.7)
WBC: 5 10*3/uL (ref 3.9–10.0)

## 2014-12-20 LAB — CMP (CANCER CENTER ONLY)
ALT(SGPT): 38 U/L (ref 10–47)
AST: 28 U/L (ref 11–38)
Albumin: 3.3 g/dL (ref 3.3–5.5)
Alkaline Phosphatase: 115 U/L — ABNORMAL HIGH (ref 26–84)
BILIRUBIN TOTAL: 0.6 mg/dL (ref 0.20–1.60)
BUN, Bld: 42 mg/dL — ABNORMAL HIGH (ref 7–22)
CALCIUM: 9.8 mg/dL (ref 8.0–10.3)
CO2: 30 mEq/L (ref 18–33)
Chloride: 97 mEq/L — ABNORMAL LOW (ref 98–108)
Creat: 1.5 mg/dl — ABNORMAL HIGH (ref 0.6–1.2)
Glucose, Bld: 100 mg/dL (ref 73–118)
Potassium: 3.8 mEq/L (ref 3.3–4.7)
Sodium: 142 mEq/L (ref 128–145)
TOTAL PROTEIN: 7 g/dL (ref 6.4–8.1)

## 2014-12-20 LAB — HOLD TUBE, BLOOD BANK - CHCC SATELLITE

## 2014-12-20 LAB — CHCC SATELLITE - SMEAR

## 2014-12-20 NOTE — Progress Notes (Signed)
Hematology and Oncology Follow Up Visit  Monica Riggs [id number] 1919/02/11 81 y.o. 12/20/2014   Principle Diagnosis:  Refractory anemia  Current Therapy:   Supportive care with transfusions      Interim History:  Ms.  Riggs is back for followup. She is doing okay. She was hospitalized about a month ago. She had felt home. Thank you, she did not break anything.  She was transfused while hospitalized. She got 2 units of blood.  She's doing okay. She does have atrial fibrillation. This has not been out-of-control.  She's had no bleeding. She's had no fever. She's had no cough.   Her overall, her performance status is ECOG 3.  Medications:  Current outpatient prescriptions:  .  aspirin EC 81 MG tablet, Take 81 mg by mouth daily., Disp: , Rfl:  .  atorvastatin (LIPITOR) 80 MG tablet, Take 40 mg by mouth daily., Disp: , Rfl:  .  BENICAR 40 MG tablet, TAKE ONE (1) TABLET EACH DAY, Disp: 30 tablet, Rfl: 3 .  brimonidine (ALPHAGAN) 0.2 % ophthalmic solution, Place 1 drop into both eyes 2 (two) times daily., Disp: , Rfl:  .  cholecalciferol (VITAMIN D) 1000 UNITS tablet, Take 1,000 Units by mouth daily., Disp: , Rfl:  .  diltiazem (CARDIZEM CD) 240 MG 24 hr capsule, TAKE ONE (1) CAPSULE EACH DAY, Disp: 30 capsule, Rfl: 1 .  folic acid (FOLVITE) 163 MCG tablet, Take 400 mcg by mouth 2 (two) times daily., Disp: , Rfl:  .  hydrochlorothiazide (HYDRODIURIL) 25 MG tablet, TAKE ONE (1) TABLET EACH DAY, Disp: 30 tablet, Rfl: 4 .  levothyroxine (SYNTHROID, LEVOTHROID) 50 MCG tablet, TAKE ONE (1) TABLET EACH DAY, Disp: 30 tablet, Rfl: 11 .  naproxen sodium (ANAPROX) 220 MG tablet, Take 220 mg by mouth as needed., Disp: , Rfl:  .  Omega-3 Fatty Acids (FISH OIL) 1000 MG CAPS, Take 1 capsule by mouth daily., Disp: , Rfl:  .  polyethylene glycol (MIRALAX / GLYCOLAX) packet, Take 17 g by mouth daily as needed for mild constipation., Disp: , Rfl:   Allergies:  Allergies  Allergen Reactions  .  Sulfa Antibiotics Rash    Past Medical History, Surgical history, Social history, and Family History were reviewed and updated.  Review of Systems: As above  Physical Exam:  height is 5\' 1"  (1.549 m) and weight is 116 lb (52.617 kg). Her oral temperature is 97.4 F (36.3 C). Her blood pressure is 108/45 and her pulse is 75. Her respiration is 14.   Totally, petite white female. She is somewhat pale. Lungs are clear. Cardiac exam irregular rate and rhythm consistent with atrial fibrillation. . She is a 2/6 systolic ejection murmur. Abdomen is soft. She has good bowel sounds. There is no fluid wave is no palpable liver or spleen . Back exam shows no tenderness over the spine ribs or hips. There is some slight kyphosis. Extremities shows minimal edema in her lower legs. Skin exam shows no rashes or ecchymosis. Neurological exam is nonfocal.  Lab Results  Component Value Date   WBC 5.0 12/20/2014   HGB 7.0* 12/20/2014   HCT 22.2* 12/20/2014   MCV 100 12/20/2014   PLT 187 12/20/2014     Chemistry      Component Value Date/Time   NA 142 12/20/2014 1129   NA 132* 11/19/2014 0330   NA 133* 09/14/2014 1630   K 3.8 12/20/2014 1129   K 4.3 11/19/2014 0330   CL 97* 12/20/2014 1129  CL 102 11/19/2014 0330   CO2 30 12/20/2014 1129   CO2 25 11/19/2014 0330   BUN 42* 12/20/2014 1129   BUN 36* 11/19/2014 0330   BUN 27 09/14/2014 1630   CREATININE 1.5* 12/20/2014 1129   CREATININE 0.95 11/19/2014 0330      Component Value Date/Time   CALCIUM 9.8 12/20/2014 1129   CALCIUM 8.8 11/19/2014 0330   ALKPHOS 115* 12/20/2014 1129   ALKPHOS 78 11/18/2014 0440   AST 28 12/20/2014 1129   AST 37 11/18/2014 0440   ALT 38 12/20/2014 1129   ALT 51* 11/18/2014 0440   BILITOT 0.60 12/20/2014 1129   BILITOT 0.9 11/18/2014 0440         Impression and Plan: Monica Riggs is a 81 year old white female. She is myelodysplasia.  Again, she's been doing as well as one could expect.   She will need to  be transfusing next couple weeks. I want make sure she gets transfused before Easter so she can enjoy Easter.  Her family is incredibly attentive to her. They are really doing great job with her.  Hopefully, she will not be hospitalized any time in the near future.  We will see her back about 2 weeks or so after her transfusion. She will be transfused on March 22.    Volanda Napoleon, MD 3/7/20161:25 PM

## 2015-01-03 ENCOUNTER — Ambulatory Visit (HOSPITAL_COMMUNITY)
Admission: RE | Admit: 2015-01-03 | Discharge: 2015-01-03 | Disposition: A | Discharge status: Home / Self Care | Payer: Medicare Other | Source: Ambulatory Visit | Attending: Hematology & Oncology | Admitting: Hematology & Oncology

## 2015-01-03 DIAGNOSIS — D46Z Other myelodysplastic syndromes: Secondary | ICD-10-CM | POA: Insufficient documentation

## 2015-01-03 DIAGNOSIS — D4620 Refractory anemia with excess of blasts, unspecified: Secondary | ICD-10-CM

## 2015-01-04 ENCOUNTER — Other Ambulatory Visit (HOSPITAL_BASED_OUTPATIENT_CLINIC_OR_DEPARTMENT_OTHER): Payer: Medicare Other | Admitting: Lab

## 2015-01-04 ENCOUNTER — Ambulatory Visit (HOSPITAL_BASED_OUTPATIENT_CLINIC_OR_DEPARTMENT_OTHER): Payer: Medicare Other

## 2015-01-04 VITALS — BP 116/53 | HR 64 | Temp 97.7°F | Resp 20

## 2015-01-04 DIAGNOSIS — IMO0001 Reserved for inherently not codable concepts without codable children: Secondary | ICD-10-CM

## 2015-01-04 DIAGNOSIS — D4620 Refractory anemia with excess of blasts, unspecified: Secondary | ICD-10-CM

## 2015-01-04 DIAGNOSIS — D469 Myelodysplastic syndrome, unspecified: Secondary | ICD-10-CM | POA: Diagnosis not present

## 2015-01-04 DIAGNOSIS — D460 Refractory anemia without ring sideroblasts, so stated: Secondary | ICD-10-CM | POA: Diagnosis not present

## 2015-01-04 DIAGNOSIS — D46Z Other myelodysplastic syndromes: Secondary | ICD-10-CM | POA: Diagnosis not present

## 2015-01-04 LAB — PREPARE RBC (CROSSMATCH)

## 2015-01-04 LAB — CBC WITH DIFFERENTIAL (CANCER CENTER ONLY)
HEMATOCRIT: 18.2 % — AB (ref 34.8–46.6)
HGB: 6 g/dL — CL (ref 11.6–15.9)
MCH: 33 pg (ref 26.0–34.0)
MCHC: 33 g/dL (ref 32.0–36.0)
MCV: 100 fL (ref 81–101)
Platelets: 191 10*3/uL (ref 145–400)
RBC: 1.82 10*6/uL — AB (ref 3.70–5.32)
WBC: 3.4 10*3/uL — ABNORMAL LOW (ref 3.9–10.0)

## 2015-01-04 LAB — MANUAL DIFFERENTIAL (CHCC SATELLITE)
ALC: 0.7 10*3/uL (ref 0.6–2.2)
ANC (CHCC HP manual diff): 2.2 10*3/uL (ref 1.5–6.7)
Band Neutrophils: 7 % (ref 0–10)
EOS: 6 % (ref 0–7)
LYMPH: 20 % (ref 14–48)
METAMYELOCYTES PCT: 1 % — AB (ref 0–0)
MONO: 7 % (ref 0–13)
PLT EST ~~LOC~~: ADEQUATE
SEG: 59 % (ref 40–75)

## 2015-01-04 LAB — RETICULOCYTES (CHCC)
ABS Retic: 30.1 10*3/uL (ref 19.0–186.0)
RBC.: 1.88 MIL/uL — ABNORMAL LOW (ref 3.87–5.11)
Retic Ct Pct: 1.6 % (ref 0.4–2.3)

## 2015-01-04 LAB — FERRITIN CHCC: Ferritin: 3053 ng/ml — ABNORMAL HIGH (ref 9–269)

## 2015-01-04 LAB — CHCC SATELLITE - SMEAR

## 2015-01-04 LAB — IRON AND TIBC CHCC
%SAT: >100 % (ref 21–?)
Iron: 169 ug/dL — ABNORMAL HIGH (ref 41–142)
TIBC: 161 ug/dL — ABNORMAL LOW (ref 236–444)

## 2015-01-04 LAB — HOLD TUBE, BLOOD BANK - CHCC SATELLITE

## 2015-01-04 MED ORDER — DIPHENHYDRAMINE HCL 25 MG PO CAPS
ORAL_CAPSULE | ORAL | Status: AC
  Filled 2015-01-04: qty 1

## 2015-01-04 MED ORDER — ACETAMINOPHEN 325 MG PO TABS
ORAL_TABLET | ORAL | Status: AC
  Filled 2015-01-04: qty 2

## 2015-01-04 MED ORDER — DIPHENHYDRAMINE HCL 25 MG PO CAPS
25.0000 mg | ORAL_CAPSULE | Freq: Once | ORAL | Status: AC
  Administered 2015-01-04: 25 mg via ORAL

## 2015-01-04 MED ORDER — FUROSEMIDE 10 MG/ML IJ SOLN: 10.0000 mg | Freq: Once | INTRAMUSCULAR | Status: DC

## 2015-01-04 MED ORDER — ACETAMINOPHEN 325 MG PO TABS
650.0000 mg | ORAL_TABLET | Freq: Once | ORAL | Status: AC
  Administered 2015-01-04: 650 mg via ORAL

## 2015-01-04 NOTE — Patient Instructions (Signed)

## 2015-01-05 LAB — TYPE AND SCREEN
ABO/RH(D): O NEG
Antibody Screen: NEGATIVE
Unit division: 0
Unit division: 0

## 2015-01-10 ENCOUNTER — Encounter: Payer: Self-pay | Admitting: Hematology & Oncology

## 2015-01-11 ENCOUNTER — Ambulatory Visit (INDEPENDENT_AMBULATORY_CARE_PROVIDER_SITE_OTHER): Payer: Medicare Other | Admitting: Family Medicine

## 2015-01-11 ENCOUNTER — Encounter: Payer: Self-pay | Admitting: Family Medicine

## 2015-01-11 VITALS — BP 114/78 | HR 88 | Temp 97.0°F | Ht 61.0 in | Wt 116.0 lb

## 2015-01-11 DIAGNOSIS — I481 Persistent atrial fibrillation: Secondary | ICD-10-CM | POA: Diagnosis not present

## 2015-01-11 DIAGNOSIS — D508 Other iron deficiency anemias: Secondary | ICD-10-CM | POA: Diagnosis not present

## 2015-01-11 DIAGNOSIS — D6189 Other specified aplastic anemias and other bone marrow failure syndromes: Secondary | ICD-10-CM | POA: Diagnosis not present

## 2015-01-11 DIAGNOSIS — S51811A Laceration without foreign body of right forearm, initial encounter: Secondary | ICD-10-CM | POA: Diagnosis not present

## 2015-01-11 DIAGNOSIS — I4819 Other persistent atrial fibrillation: Secondary | ICD-10-CM

## 2015-01-11 DIAGNOSIS — E785 Hyperlipidemia, unspecified: Secondary | ICD-10-CM | POA: Diagnosis not present

## 2015-01-11 DIAGNOSIS — E559 Vitamin D deficiency, unspecified: Secondary | ICD-10-CM | POA: Diagnosis not present

## 2015-01-11 DIAGNOSIS — I4891 Unspecified atrial fibrillation: Secondary | ICD-10-CM | POA: Diagnosis not present

## 2015-01-11 DIAGNOSIS — E039 Hypothyroidism, unspecified: Secondary | ICD-10-CM

## 2015-01-11 LAB — POCT CBC
Granulocyte percent: 58.1 %G (ref 37–80)
HCT, POC: 49.4 % — AB (ref 37.7–47.9)
HEMOGLOBIN: 14.7 g/dL (ref 12.2–16.2)
Lymph, poc: 3.6 — AB (ref 0.6–3.4)
MCH, POC: 26.5 pg — AB (ref 27–31.2)
MCHC: 29.7 g/dL — AB (ref 31.8–35.4)
MCV: 89.1 fL (ref 80–97)
MPV: 8.7 fL (ref 0–99.8)
POC Granulocyte: 5.5 (ref 2–6.9)
POC LYMPH PERCENT: 37.4 %L (ref 10–50)
Platelet Count, POC: 170 10*3/uL (ref 142–424)
RBC: 5.54 M/uL — AB (ref 4.04–5.48)
RDW, POC: 13.5 %
WBC: 9.5 10*3/uL (ref 4.6–10.2)

## 2015-01-11 NOTE — Progress Notes (Signed)
Subjective:    Patient ID: Monica Riggs, female    DOB: Sep 16, 1919, 81 y.o.   MRN: [id number]  HPI Pt here for follow up and management of chronic medical problems which includes hypertension, hyperlipidemia, anemia, and artrial fibrillation. She is taking medications regularly. The patient sees the hematologist, Dr. Marin Olp on a regular basis because of her chronic ongoing anemia. The patient sees the hematologist about every 3 months and gets 2 units of blood transfusion and gets it sooner if her hemoglobin drops to 6. She is recently had a transfusion. She said the last time she had a visit there was on March 22. She indicates that she tore the skin on her arm on Easter evening and just had a loosely fitting dressing on this.       Patient Active Problem List   Diagnosis Date Noted  . Absolute anemia   . Fall (on)(from) incline, initial encounter 11/17/2014  . Left knee injury 11/17/2014  . Chronic atrial fibrillation with RVR   . Arthritis   . Hypertension   . Refractory anemia 11/02/2014  . Persistent atrial fibrillation 09/14/2014  . Hyperlipemia 04/27/2013  . Hypothyroid 04/27/2013  . Fatigue 04/27/2013  . Vitamin D deficiency 04/27/2013  . Myelodysplasia 01/12/2013   Outpatient Encounter Prescriptions as of 01/11/2015  Medication Sig  . aspirin EC 81 MG tablet Take 81 mg by mouth daily.  Marland Kitchen atorvastatin (LIPITOR) 80 MG tablet Take 40 mg by mouth daily.  Marland Kitchen BENICAR 40 MG tablet TAKE ONE (1) TABLET EACH DAY  . brimonidine (ALPHAGAN) 0.2 % ophthalmic solution Place 1 drop into both eyes 2 (two) times daily.  . cholecalciferol (VITAMIN D) 1000 UNITS tablet Take 1,000 Units by mouth daily.  Marland Kitchen diltiazem (CARDIZEM CD) 240 MG 24 hr capsule TAKE ONE (1) CAPSULE EACH DAY  . folic acid (FOLVITE) 974 MCG tablet Take 400 mcg by mouth 2 (two) times daily.  . hydrochlorothiazide (HYDRODIURIL) 25 MG tablet TAKE ONE (1) TABLET EACH DAY  . levothyroxine (SYNTHROID, LEVOTHROID) 50 MCG  tablet TAKE ONE (1) TABLET EACH DAY  . naproxen sodium (ANAPROX) 220 MG tablet Take 220 mg by mouth as needed.  . Omega-3 Fatty Acids (FISH OIL) 1000 MG CAPS Take 1 capsule by mouth daily.  . polyethylene glycol (MIRALAX / GLYCOLAX) packet Take 17 g by mouth daily as needed for mild constipation.    Review of Systems  Constitutional: Negative.   HENT: Negative.   Eyes: Negative.   Respiratory: Negative.   Cardiovascular: Negative.   Gastrointestinal: Negative.   Endocrine: Negative.   Genitourinary: Negative.   Musculoskeletal: Negative.   Skin: Negative.   Allergic/Immunologic: Negative.   Neurological: Negative.   Hematological: Negative.   Psychiatric/Behavioral: Negative.        Objective:   Physical Exam  Constitutional: She is oriented to person, place, and time. No distress.  Patient is thin and frail and pale and seems to be more distant neurologically than in the past and not as alert  HENT:  Head: Normocephalic and atraumatic.  Right Ear: External ear normal.  Left Ear: External ear normal.  Nose: Nose normal.  Mouth/Throat: Oropharynx is clear and moist.  Eyes: Conjunctivae and EOM are normal. Pupils are equal, round, and reactive to light. Right eye exhibits no discharge. Left eye exhibits no discharge. No scleral icterus.  Neck: Normal range of motion. Neck supple. No thyromegaly present.  Cardiovascular: Normal rate and normal heart sounds.   No murmur heard. The heart  is 72/m and slightly irregular  Pulmonary/Chest: Effort normal and breath sounds normal. No respiratory distress. She has no wheezes. She has no rales. She exhibits no tenderness.  Abdominal: Soft. Bowel sounds are normal. She exhibits no mass. There is no tenderness. There is no rebound and no guarding.  Musculoskeletal: Normal range of motion. She exhibits no edema or tenderness.  The patient is using a cane and seemed to be somewhat slower and weaker in her gait  Lymphadenopathy:    She has  no cervical adenopathy.  Neurological: She is alert and oriented to person, place, and time.  Skin: Skin is warm and dry. No rash noted. No erythema. No pallor.  A dressing was noticed on her right arm and she had a skin tear that was about 3 inches long and the skin was pulled up under the care and not closing the wound. The area was soaked with saline and the skin was approximated with both edges and 4 Steri-Strips were applied along with a pressure dressing. This was about a 2-1/2-3 inch tear  Psychiatric: She has a normal mood and affect. Her behavior is normal. Judgment and thought content normal.  The patient seemed to not hear well today and did not appear as alert as usual  Nursing note and vitals reviewed.  BP 114/78 mmHg  Pulse 88  Temp(Src) 97 F (36.1 C) (Oral)  Ht _0  (1.549 m)  Wt 116 lb (52.617 kg)  BMI 21.93 kg/m2        Assessment & Plan:  1. Hyperlipemia -The patient should continue with her atorvastatin pending results of lab work - BMP8+EGFR - Hepatic function panel - Lipid panel  2. Hypothyroidism, unspecified hypothyroidism type -The patient should continue with her current dose of levothyroxine pending results of lab work being done today  3. Vitamin D deficiency -She should continue with her vitamin D replacement - Vit D  25 hydroxy (rtn osteoporosis monitoring)  4. Other iron deficiency anemias -Continue to follow-up with hematology - POCT CBC  5. Persistent atrial fibrillation -The heart was slightly more regular today at 72/m - BMP8+EGFR - Hepatic function panel  6. Other specified aplastic anemia or other bone marrow failure syndrome -The CBC today was much better than the patient appeared to work. We will send one off for confirmation. She should continue with follow-up by a hematologist - POCT CBC  7. Laceration of right forearm, initial encounter -This was closed with 4 Steri-Strips and a pressure dressing  Patient Instructions                        Medicare Annual Wellness Visit  [facility] and the medical providers at [facility] strive to bring you the best medical care.  In doing so we not only want to address your current medical conditions and concerns but also to detect new conditions early and prevent illness, disease and health-related problems.    Medicare offers a yearly Wellness Visit which allows our clinical staff to assess your need for preventative services including immunizations, lifestyle education, counseling to decrease risk of preventable diseases and screening for fall risk and other medical concerns.    This visit is provided free of charge (no copay) for all Medicare recipients. The clinical pharmacists at [facility] have begun to conduct these Wellness Visits which will also include a thorough review of all your medications.    As you primary medical provider recommend  that you make an appointment for your Annual Wellness Visit if you have not done so already this year.  You may set up this appointment before you leave today or you may call back ([phone]) and schedule an appointment.  Please make sure when you call that you mention that you are scheduling your Annual Wellness Visit with the clinical pharmacist so that the appointment may be made for the proper length of time.     Continue current medications. Continue good therapeutic lifestyle changes which include good diet and exercise. Fall precautions discussed with patient. If an FOBT was given today- please return it to our front desk. If you are over 82 years old - you may need Prevnar 49 or the adult Pneumonia vaccine.  Flu Shots are still available at our office. If you still haven't had one please call to set up a nurse visit to get one.   After your visit with [place] today you will receive a survey in the mail or online from [employer] regarding your care with [place]. Please take a moment to fill  this out. Your feedback is very important to [place] as you can help [place] better understand your patient needs as well as improve your experience and satisfaction. WE CARE ABOUT YOU!!!   The patient should keep the wound on the forearm clean and dry and not let it get wet Leave the dressing on a possible until recheck visit in 1 week   Arrie Senate MD

## 2015-01-11 NOTE — Patient Instructions (Addendum)
Medicare Annual Wellness Visit  [facility] and the medical providers at [facility] strive to bring you the best medical care.  In doing so we not only want to address your current medical conditions and concerns but also to detect new conditions early and prevent illness, disease and health-related problems.    Medicare offers a yearly Wellness Visit which allows our clinical staff to assess your need for preventative services including immunizations, lifestyle education, counseling to decrease risk of preventable diseases and screening for fall risk and other medical concerns.    This visit is provided free of charge (no copay) for all Medicare recipients. The clinical pharmacists at [facility] have begun to conduct these Wellness Visits which will also include a thorough review of all your medications.    As you primary medical provider recommend that you make an appointment for your Annual Wellness Visit if you have not done so already this year.  You may set up this appointment before you leave today or you may call back [phone]) and schedule an appointment.  Please make sure when you call that you mention that you are scheduling your Annual Wellness Visit with the clinical pharmacist so that the appointment may be made for the proper length of time.     Continue current medications. Continue good therapeutic lifestyle changes which include good diet and exercise. Fall precautions discussed with patient. If an FOBT was given today- please return it to our front desk. If you are over 60 years old - you may need Prevnar 55 or the adult Pneumonia vaccine.  Flu Shots are still available at our office. If you still haven't had one please call to set up a nurse visit to get one.   After your visit with [place] today you will receive a survey in the mail or online from [employer] regarding your care with [place]. Please take a moment to  fill this out. Your feedback is very important to [place] as you can help [place] better understand your patient needs as well as improve your experience and satisfaction. WE CARE ABOUT YOU!!!   The patient should keep the wound on the forearm clean and dry and not let it get wet Leave the dressing on a possible until recheck visit in 1 week

## 2015-01-12 ENCOUNTER — Other Ambulatory Visit: Payer: Self-pay | Admitting: *Deleted

## 2015-01-12 LAB — HEPATIC FUNCTION PANEL
ALBUMIN: 3.4 g/dL (ref 3.2–4.6)
ALT: 44 IU/L — ABNORMAL HIGH (ref 0–32)
AST: 26 IU/L (ref 0–40)
Alkaline Phosphatase: 90 IU/L (ref 39–117)
BILIRUBIN TOTAL: 0.8 mg/dL (ref 0.0–1.2)
BILIRUBIN, DIRECT: 0.28 mg/dL (ref 0.00–0.40)
Total Protein: 5.9 g/dL — ABNORMAL LOW (ref 6.0–8.5)

## 2015-01-12 LAB — LIPID PANEL
CHOLESTEROL TOTAL: 119 mg/dL (ref 100–199)
Chol/HDL Ratio: 2.5 ratio units (ref 0.0–4.4)
HDL: 48 mg/dL (ref >39)
LDL Calculated: 53 mg/dL (ref 0–99)
Triglycerides: 89 mg/dL (ref 0–149)
VLDL Cholesterol Cal: 18 mg/dL (ref 5–40)

## 2015-01-12 LAB — CBC WITH DIFFERENTIAL/PLATELET
Basophils Absolute: 0.1 10*3/uL (ref 0.0–0.2)
Basos: 2 %
EOS ABS: 0.4 10*3/uL (ref 0.0–0.4)
Eos: 11 %
HCT: 28.6 % — ABNORMAL LOW (ref 34.0–46.6)
HEMOGLOBIN: 9.6 g/dL — AB (ref 11.1–15.9)
Immature Grans (Abs): 0 10*3/uL (ref 0.0–0.1)
Immature Granulocytes: 1 %
LYMPHS ABS: 0.6 10*3/uL — AB (ref 0.7–3.1)
LYMPHS: 20 %
MCH: 29.8 pg (ref 26.6–33.0)
MCHC: 33.6 g/dL (ref 31.5–35.7)
MCV: 89 fL (ref 79–97)
MONOCYTES: 13 %
Monocytes Absolute: 0.4 10*3/uL (ref 0.1–0.9)
NEUTROS ABS: 1.7 10*3/uL (ref 1.4–7.0)
Neutrophils Relative %: 53 %
Platelets: 149 10*3/uL — ABNORMAL LOW (ref 150–379)
RBC: 3.22 x10E6/uL — ABNORMAL LOW (ref 3.77–5.28)
RDW: 21.5 % — ABNORMAL HIGH (ref 12.3–15.4)
WBC: 3.2 10*3/uL — AB (ref 3.4–10.8)

## 2015-01-12 LAB — BMP8+EGFR
BUN / CREAT RATIO: 14 (ref 11–26)
BUN: 14 mg/dL (ref 10–36)
CO2: 27 mmol/L (ref 18–29)
Calcium: 9.4 mg/dL (ref 8.7–10.3)
Chloride: 98 mmol/L (ref 97–108)
Creatinine, Ser: 0.99 mg/dL (ref 0.57–1.00)
GFR calc Af Amer: 57 mL/min/{1.73_m2} — ABNORMAL LOW (ref >59)
GFR calc non Af Amer: 49 mL/min/{1.73_m2} — ABNORMAL LOW (ref >59)
Glucose: 106 mg/dL — ABNORMAL HIGH (ref 65–99)
Potassium: 3.6 mmol/L (ref 3.5–5.2)
Sodium: 139 mmol/L (ref 134–144)

## 2015-01-12 LAB — VITAMIN D 25 HYDROXY (VIT D DEFICIENCY, FRACTURES): Vit D, 25-Hydroxy: 44.4 ng/mL (ref 30.0–100.0)

## 2015-01-13 DIAGNOSIS — Z9181 History of falling: Secondary | ICD-10-CM | POA: Diagnosis not present

## 2015-01-13 DIAGNOSIS — I4891 Unspecified atrial fibrillation: Secondary | ICD-10-CM | POA: Diagnosis not present

## 2015-01-13 DIAGNOSIS — I10 Essential (primary) hypertension: Secondary | ICD-10-CM | POA: Diagnosis not present

## 2015-01-13 DIAGNOSIS — D469 Myelodysplastic syndrome, unspecified: Secondary | ICD-10-CM | POA: Diagnosis not present

## 2015-01-13 DIAGNOSIS — E038 Other specified hypothyroidism: Secondary | ICD-10-CM | POA: Diagnosis not present

## 2015-01-14 ENCOUNTER — Other Ambulatory Visit: Payer: Self-pay | Admitting: *Deleted

## 2015-01-14 ENCOUNTER — Ambulatory Visit: Payer: Medicare Other | Admitting: Family Medicine

## 2015-01-14 DIAGNOSIS — D464 Refractory anemia, unspecified: Secondary | ICD-10-CM

## 2015-01-17 ENCOUNTER — Other Ambulatory Visit (HOSPITAL_BASED_OUTPATIENT_CLINIC_OR_DEPARTMENT_OTHER): Payer: Medicare Other

## 2015-01-17 ENCOUNTER — Ambulatory Visit (HOSPITAL_BASED_OUTPATIENT_CLINIC_OR_DEPARTMENT_OTHER): Payer: Medicare Other | Admitting: Family

## 2015-01-17 VITALS — BP 111/62 | HR 86 | Temp 94.4°F | Wt 113.0 lb

## 2015-01-17 DIAGNOSIS — D469 Myelodysplastic syndrome, unspecified: Secondary | ICD-10-CM | POA: Diagnosis not present

## 2015-01-17 DIAGNOSIS — D464 Refractory anemia, unspecified: Secondary | ICD-10-CM

## 2015-01-17 DIAGNOSIS — IMO0001 Reserved for inherently not codable concepts without codable children: Secondary | ICD-10-CM

## 2015-01-17 LAB — CBC WITH DIFFERENTIAL (CANCER CENTER ONLY)
HCT: 28.8 % — ABNORMAL LOW (ref 34.8–46.6)
HEMOGLOBIN: 9.5 g/dL — AB (ref 11.6–15.9)
MCH: 30.7 pg (ref 26.0–34.0)
MCHC: 33 g/dL (ref 32.0–36.0)
MCV: 93 fL (ref 81–101)
Platelets: 164 10*3/uL (ref 145–400)
RBC: 3.09 10*6/uL — ABNORMAL LOW (ref 3.70–5.32)
RDW: 21.4 % — AB (ref 11.1–15.7)
WBC: 3.8 10*3/uL — ABNORMAL LOW (ref 3.9–10.0)

## 2015-01-17 LAB — MANUAL DIFFERENTIAL (CHCC SATELLITE)
ALC: 1.3 10*3/uL (ref 0.6–2.2)
ANC (CHCC HP manual diff): 2 10*3/uL (ref 1.5–6.7)
BASO: 1 % (ref 0–2)
EOS: 9 % — AB (ref 0–7)
LYMPH: 35 % (ref 14–48)
METAMYELOCYTES PCT: 1 % — AB (ref 0–0)
MONO: 3 % (ref 0–13)
PLT EST ~~LOC~~: ADEQUATE
SEG: 51 % (ref 40–75)

## 2015-01-17 NOTE — Progress Notes (Signed)
Hematology and Oncology Follow Up Visit  ALLESHA ARONOFF [id number] 1919/02/08 81 y.o. 01/17/2015   Principle Diagnosis:  Refractory anemia Myelodysplasia   Current Therapy:   Supportive care with transfusions    Interim History: Ms. Dimino is here today for labs and follow-up. She is feeling a little better but still sore. She did have a fall last month but thankfully didn't break anything. She was transfused on March 22nd. Her Hgb today is up to 9.5 and MCV is 93.  She denies fever, chills, n/v, cough, rash, headache, dizziness, SOB, chest pain, palpitations, abdominal pain, diarrhea, blood in urine or stool. She uses Mirilax to help with her constipation.  No swelling, tenderness, numbness or tingling in her extremities. No new aches or pains.  Her appetite is still down. Her food tastes "dull." She is drinking some fluids. Her weight is stable.   She is still living by herself but stays with her sister after she is transfused for a few days.   Medications:    Medication List       This list is accurate as of: 01/17/15  1:09 PM.  Always use your most recent med list.               aspirin EC 81 MG tablet  Take 81 mg by mouth daily.     BENICAR 40 MG tablet  Generic drug:  olmesartan  TAKE ONE (1) TABLET EACH DAY     brimonidine 0.2 % ophthalmic solution  Commonly known as:  ALPHAGAN  Place 1 drop into both eyes 2 (two) times daily.     cholecalciferol 1000 UNITS tablet  Commonly known as:  VITAMIN D  Take 1,000 Units by mouth daily.     diltiazem 240 MG 24 hr capsule  Commonly known as:  CARDIZEM CD  TAKE ONE (1) CAPSULE EACH DAY     Fish Oil 1000 MG Caps  Take 1 capsule by mouth daily.     folic acid 469 MCG tablet  Commonly known as:  FOLVITE  Take 400 mcg by mouth 2 (two) times daily.     hydrochlorothiazide 25 MG tablet  Commonly known as:  HYDRODIURIL  TAKE ONE (1) TABLET EACH DAY     levothyroxine 50 MCG tablet  Commonly known as:  SYNTHROID,  LEVOTHROID  TAKE ONE (1) TABLET EACH DAY     naproxen sodium 220 MG tablet  Commonly known as:  ANAPROX  Take 220 mg by mouth as needed.     polyethylene glycol packet  Commonly known as:  MIRALAX / GLYCOLAX  Take 17 g by mouth daily as needed for mild constipation.        Allergies:  Allergies  Allergen Reactions  . Sulfa Antibiotics Rash    Past Medical History, Surgical history, Social history, and Family History were reviewed and updated.  Review of Systems: All other 10 point review of systems is negative.   Physical Exam:  vitals were not taken for this visit.  Wt Readings from Last 3 Encounters:  01/11/15 116 lb (52.617 kg)  12/20/14 116 lb (52.617 kg)  11/17/14 115 lb 8 oz (52.39 kg)   Ocular: Sclerae unicteric, pupils equal, round and reactive to light Ear-nose-throat: Oropharynx clear, dentition fair Lymphatic: No cervical or supraclavicular adenopathy Lungs no rales or rhonchi, good excursion bilaterally Heart regular rate and rhythm, no murmur appreciated Abd soft, nontender, positive bowel sounds MSK no focal spinal tenderness, no joint edema Neuro: non-focal, well-oriented, appropriate affect  Breasts: Deferred  Lab Results  Component Value Date   WBC 3.2* 01/11/2015   HGB 9.6* 01/11/2015   HCT 28.6* 01/11/2015   MCV 89 01/11/2015   PLT 149* 01/11/2015   Lab Results  Component Value Date   FERRITIN 3,053* 01/04/2015   IRON 169* 01/04/2015   TIBC 161* 01/04/2015   UIBC <1.0 01/04/2015   IRONPCTSAT >100 01/04/2015   Lab Results  Component Value Date   RETICCTPCT 1.6 01/04/2015   RBC 3.22* 01/11/2015   RETICCTABS 30.1 01/04/2015   No results found for: KPAFRELGTCHN, LAMBDASER, KAPLAMBRATIO No results found for: Kandis Cocking [facility] Lab Results  Component Value Date   TOTALPROTELP 6.3 09/22/2012   ALBUMINELP 59.8 09/22/2012   A1GS 6.4* 09/22/2012   A2GS 10.7 09/22/2012   BETS 5.7 09/22/2012   BETA2SER 4.6 09/22/2012   GAMS 12.8  09/22/2012   MSPIKE NOT DET 09/22/2012   SPEI * 09/22/2012     Chemistry      Component Value Date/Time   NA 139 01/11/2015 1614   NA 142 12/20/2014 1129   NA 132* 11/19/2014 0330   K 3.6 01/11/2015 1614   K 3.8 12/20/2014 1129   CL 98 01/11/2015 1614   CL 97* 12/20/2014 1129   CO2 27 01/11/2015 1614   CO2 30 12/20/2014 1129   BUN 14 01/11/2015 1614   BUN 42* 12/20/2014 1129   BUN 36* 11/19/2014 0330   CREATININE 0.99 01/11/2015 1614   CREATININE 1.5* 12/20/2014 1129      Component Value Date/Time   CALCIUM 9.4 01/11/2015 1614   CALCIUM 9.8 12/20/2014 1129   ALKPHOS 90 01/11/2015 1614   ALKPHOS 115* 12/20/2014 1129   AST 26 01/11/2015 1614   AST 28 12/20/2014 1129   ALT 44* 01/11/2015 1614   ALT 38 12/20/2014 1129   BILITOT 0.8 01/11/2015 1614   BILITOT 0.60 12/20/2014 1129   BILITOT 0.9 11/18/2014 0440     Impression and Plan: Ms. Zawistowski is a 81 year old white female with myelodysplasia. She is feeling a little better today but still sore from her fall.  Her Hgb is up to 9.5 after her transfusion on March 22nd. We will see what her iron studies show. We will see her back for follow-up and labs in 1 month.  She knows to call here with any questions or concerns and to go to the ED in the event of an emergency. We can certainly see her sooner if need be.  Eliezer Bottom, NP 4/4/20161:09 PM

## 2015-01-18 ENCOUNTER — Ambulatory Visit: Payer: Medicare Other | Admitting: Family Medicine

## 2015-01-19 ENCOUNTER — Ambulatory Visit: Payer: Medicare Other | Admitting: Family Medicine

## 2015-01-19 ENCOUNTER — Ambulatory Visit (INDEPENDENT_AMBULATORY_CARE_PROVIDER_SITE_OTHER): Payer: Medicare Other | Admitting: Family Medicine

## 2015-01-21 ENCOUNTER — Ambulatory Visit: Payer: Medicare Other | Admitting: Family Medicine

## 2015-01-26 ENCOUNTER — Ambulatory Visit: Payer: Medicare Other | Admitting: Family Medicine

## 2015-02-05 ENCOUNTER — Other Ambulatory Visit: Payer: Self-pay | Admitting: Family Medicine

## 2015-02-14 ENCOUNTER — Other Ambulatory Visit: Payer: Medicare Other

## 2015-02-14 ENCOUNTER — Ambulatory Visit: Admitting: Hematology & Oncology

## 2015-02-22 ENCOUNTER — Other Ambulatory Visit (INDEPENDENT_AMBULATORY_CARE_PROVIDER_SITE_OTHER): Payer: Medicare Other | Admitting: *Deleted

## 2015-02-22 ENCOUNTER — Telehealth: Payer: Self-pay | Admitting: *Deleted

## 2015-02-22 ENCOUNTER — Other Ambulatory Visit: Payer: Self-pay | Admitting: Pharmacist

## 2015-02-22 DIAGNOSIS — R4182 Altered mental status, unspecified: Secondary | ICD-10-CM | POA: Diagnosis not present

## 2015-02-22 DIAGNOSIS — F05 Delirium due to known physiological condition: Secondary | ICD-10-CM

## 2015-02-22 DIAGNOSIS — R197 Diarrhea, unspecified: Secondary | ICD-10-CM | POA: Diagnosis not present

## 2015-02-22 LAB — URINALYSIS, ROUTINE W REFLEX MICROSCOPIC
GLUCOSE, UA: NEGATIVE mg/dL
HGB URINE DIPSTICK: NEGATIVE
NITRITE: NEGATIVE
PROTEIN: NEGATIVE mg/dL
Specific Gravity, Urine: 1.016 (ref 1.005–1.030)
Urobilinogen, UA: 1 mg/dL (ref 0.0–1.0)
pH: 5.5 (ref 5.0–8.0)

## 2015-02-22 LAB — CBC
HCT: 16.9 % — ABNORMAL LOW (ref 36.0–46.0)
Hemoglobin: 5.5 g/dL — CL (ref 12.0–15.0)
MCH: 31.3 pg (ref 26.0–34.0)
MCHC: 32.5 g/dL (ref 30.0–36.0)
MCV: 96 fL (ref 78.0–100.0)
MPV: 12.6 fL — AB (ref 8.6–12.4)
PLATELETS: 178 10*3/uL (ref 150–400)
RBC: 1.76 MIL/uL — ABNORMAL LOW (ref 3.87–5.11)
WBC: 5.8 10*3/uL (ref 4.0–10.5)

## 2015-02-22 LAB — URINALYSIS, MICROSCOPIC ONLY
Bacteria, UA: NONE SEEN
Crystals: NONE SEEN

## 2015-02-22 LAB — BASIC METABOLIC PANEL
BUN: 49 mg/dL — ABNORMAL HIGH (ref 6–23)
CO2: 23 mEq/L (ref 19–32)
Calcium: 9.2 mg/dL (ref 8.4–10.5)
Chloride: 103 mEq/L (ref 96–112)
Creat: 1.43 mg/dL — ABNORMAL HIGH (ref 0.50–1.10)
Glucose, Bld: 116 mg/dL — ABNORMAL HIGH (ref 70–99)
Potassium: 3.5 mEq/L (ref 3.5–5.3)
Sodium: 140 mEq/L (ref 135–145)

## 2015-02-22 NOTE — Telephone Encounter (Signed)
Per pt's granddaughter pt is having some nausea and diarrhea.  She is also very confused  Per Dr Laurance Flatten pt needs labs to include CBC, BMP and U/A Order entered in Epic

## 2015-02-22 NOTE — Telephone Encounter (Signed)
Per Dr Laurance Flatten, labs ordered

## 2015-02-23 ENCOUNTER — Other Ambulatory Visit: Payer: Self-pay | Admitting: *Deleted

## 2015-02-23 ENCOUNTER — Ambulatory Visit (HOSPITAL_BASED_OUTPATIENT_CLINIC_OR_DEPARTMENT_OTHER): Payer: Medicare Other

## 2015-02-23 ENCOUNTER — Ambulatory Visit (HOSPITAL_COMMUNITY)
Admission: RE | Admit: 2015-02-23 | Discharge: 2015-02-23 | Disposition: A | Discharge status: Home / Self Care | Payer: Medicare Other | Source: Ambulatory Visit | Attending: Hematology & Oncology | Admitting: Hematology & Oncology

## 2015-02-23 ENCOUNTER — Telehealth: Payer: Self-pay | Admitting: Nurse Practitioner

## 2015-02-23 ENCOUNTER — Encounter: Payer: Self-pay | Admitting: Hematology & Oncology

## 2015-02-23 ENCOUNTER — Ambulatory Visit (HOSPITAL_BASED_OUTPATIENT_CLINIC_OR_DEPARTMENT_OTHER): Payer: Medicare Other | Admitting: Hematology & Oncology

## 2015-02-23 ENCOUNTER — Other Ambulatory Visit (HOSPITAL_BASED_OUTPATIENT_CLINIC_OR_DEPARTMENT_OTHER): Payer: Medicare Other

## 2015-02-23 VITALS — BP 105/41 | HR 94 | Temp 97.3°F | Resp 16 | Ht 61.0 in | Wt 113.0 lb

## 2015-02-23 VITALS — BP 129/65 | HR 102 | Temp 98.5°F | Resp 18

## 2015-02-23 DIAGNOSIS — I4891 Unspecified atrial fibrillation: Secondary | ICD-10-CM | POA: Diagnosis not present

## 2015-02-23 DIAGNOSIS — R410 Disorientation, unspecified: Secondary | ICD-10-CM

## 2015-02-23 DIAGNOSIS — D464 Refractory anemia, unspecified: Secondary | ICD-10-CM

## 2015-02-23 DIAGNOSIS — D46Z Other myelodysplastic syndromes: Secondary | ICD-10-CM | POA: Diagnosis not present

## 2015-02-23 DIAGNOSIS — IMO0001 Reserved for inherently not codable concepts without codable children: Secondary | ICD-10-CM

## 2015-02-23 DIAGNOSIS — D469 Myelodysplastic syndrome, unspecified: Secondary | ICD-10-CM

## 2015-02-23 DIAGNOSIS — Q068 Other specified congenital malformations of spinal cord: Secondary | ICD-10-CM | POA: Diagnosis not present

## 2015-02-23 DIAGNOSIS — D4620 Refractory anemia with excess of blasts, unspecified: Secondary | ICD-10-CM

## 2015-02-23 LAB — HOLD TUBE, BLOOD BANK - CHCC SATELLITE

## 2015-02-23 LAB — PREPARE RBC (CROSSMATCH)

## 2015-02-23 MED ORDER — ACETAMINOPHEN 325 MG PO TABS
ORAL_TABLET | ORAL | Status: AC
  Filled 2015-02-23: qty 2

## 2015-02-23 MED ORDER — SODIUM CHLORIDE 0.9 % IV SOLN
250.0000 mL | Freq: Once | INTRAVENOUS | Status: AC
  Administered 2015-02-23: 250 mL via INTRAVENOUS

## 2015-02-23 MED ORDER — FUROSEMIDE 10 MG/ML IJ SOLN
20.0000 mg | Freq: Once | INTRAMUSCULAR | Status: AC
  Administered 2015-02-23: 20 mg via INTRAVENOUS

## 2015-02-23 MED ORDER — DIPHENHYDRAMINE HCL 25 MG PO CAPS
ORAL_CAPSULE | ORAL | Status: AC
  Filled 2015-02-23: qty 1

## 2015-02-23 MED ORDER — FUROSEMIDE 10 MG/ML IJ SOLN
INTRAMUSCULAR | Status: AC
  Filled 2015-02-23: qty 4

## 2015-02-23 MED ORDER — DIPHENHYDRAMINE HCL 25 MG PO CAPS
25.0000 mg | ORAL_CAPSULE | Freq: Once | ORAL | Status: AC
  Administered 2015-02-23: 25 mg via ORAL

## 2015-02-23 MED ORDER — ACETAMINOPHEN 325 MG PO TABS
650.0000 mg | ORAL_TABLET | Freq: Once | ORAL | Status: AC
  Administered 2015-02-23: 650 mg via ORAL

## 2015-02-23 NOTE — Telephone Encounter (Signed)
Patient niece called wanting to know stat lab results that were dob

## 2015-02-23 NOTE — Telephone Encounter (Signed)
Patient niece called last night wanting to know results of patient stat labs that were done yesterday. The only results in computer were stat cbc results which showed hgb 5.5. Niece said it has been lower  Then that in the past and that she had appointment with hematologist on Thursday. Niece was told that this could not wait until Thursday that this was getting low enough not to sustain life. 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 SHe was mainly concerned about urine results because she thought she had UTI. I looked for results but result were not there. I told her i would call her with results when i received them. Shortly after hanging up phone, [facility] called with hgb results. I told them I was aware of results and that i needed results of urinalysis. It had not been done even though ordered stat. SHe said she would get it done and call me. At around 10:30 last night i was called with urinalysis results which were negative. Family was contacted and made aware of results. They said that would have hgb taken care of first thing this morning.

## 2015-02-23 NOTE — Patient Instructions (Signed)

## 2015-02-23 NOTE — Progress Notes (Signed)
Hematology and Oncology Follow Up Visit  Monica Riggs [id number] [birth year] 81 y.o. 02/23/2015   Principle Diagnosis:  Refractory anemia  Current Therapy:   Supportive care with transfusions      Interim History:  Monica Riggs is back for followup. She definitely has changed since we last saw her.  On foresee, a daughter of hers passed we unexpectedly a couple weeks ago. This was the actual twin daughter of the daughter who comes in with her all the time.  Since this happened, she has had confusion. She's not eating or drinking much. They went to the funeral service [facility] to [facility] and she really had a hard time.  She does have a history of dementia in the family.  She has had no obvious bleeding. She still has atrial fibrillation..   Her overall, her performance status is ECOG 3.  Medications:  Current outpatient prescriptions:  .  aspirin EC 81 MG tablet, Take 81 mg by mouth daily., Disp: , Rfl:  .  BENICAR 40 MG tablet, TAKE ONE (1) TABLET EACH DAY (Patient taking differently: TAKE ONE (1/2) TABLET EACH DAY), Disp: 30 tablet, Rfl: 3 .  brimonidine (ALPHAGAN) 0.2 % ophthalmic solution, Place 1 drop into both eyes 2 (two) times daily., Disp: , Rfl:  .  cholecalciferol (VITAMIN D) 1000 UNITS tablet, Take 1,000 Units by mouth daily., Disp: , Rfl:  .  diltiazem (CARDIZEM CD) 240 MG 24 hr capsule, TAKE ONE (1) CAPSULE EACH DAY, Disp: 30 capsule, Rfl: 1 .  folic acid (FOLVITE) 301 MCG tablet, Take 400 mcg by mouth 2 (two) times daily., Disp: , Rfl:  .  hydrochlorothiazide (HYDRODIURIL) 25 MG tablet, TAKE ONE (1) TABLET EACH DAY, Disp: 30 tablet, Rfl: 5 .  levothyroxine (SYNTHROID, LEVOTHROID) 50 MCG tablet, TAKE ONE (1) TABLET EACH DAY, Disp: 30 tablet, Rfl: 11 .  naproxen sodium (ANAPROX) 220 MG tablet, Take 220 mg by mouth as needed., Disp: , Rfl:  .  Omega-3 Fatty Acids (FISH OIL) 1000 MG CAPS, Take 1 capsule by mouth daily., Disp: , Rfl:  .  polyethylene glycol (MIRALAX /  GLYCOLAX) packet, Take 17 g by mouth daily as needed for mild constipation., Disp: , Rfl:   Allergies:  Allergies  Allergen Reactions  . Sulfa Antibiotics Rash    Past Medical History, Surgical history, Social history, and Family History were reviewed and updated.  Review of Systems: As above  Physical Exam:  height is 5\' 1"  (1.549 m) and weight is 113 lb (51.256 kg). Her oral temperature is 97.3 F (36.3 C). Her blood pressure is 105/41 and her pulse is 94. Her respiration is 16.   Elderly, petite white female. She is somewhat pale. Lungs are clear. Cardiac exam irregular rate and rhythm consistent with atrial fibrillation. . She is a 2/6 systolic ejection murmur. Abdomen is soft. She has good bowel sounds. There is no fluid wave is no palpable liver or spleen . Back exam shows no tenderness over the spine ribs or hips. There is some slight kyphosis. Extremities shows minimal edema in her lower legs. Skin exam shows no rashes or ecchymosis. Neurological exam is nonfocal. Her affect is a little bit flat  Lab Results  Component Value Date   WBC 5.8 02/22/2015   HGB 5.5* 02/22/2015   HCT 16.9* 02/22/2015   MCV 96.0 02/22/2015   PLT 178 02/22/2015     Chemistry      Component Value Date/Time   NA 140 02/22/2015 1659  NA 139 01/11/2015 1614   NA 142 12/20/2014 1129   K 3.5 02/22/2015 1659   K 3.8 12/20/2014 1129   CL 103 02/22/2015 1659   CL 97* 12/20/2014 1129   CO2 23 02/22/2015 1659   CO2 30 12/20/2014 1129   BUN 49* 02/22/2015 1659   BUN 14 01/11/2015 1614   BUN 42* 12/20/2014 1129   CREATININE 1.43* 02/22/2015 1659   CREATININE 0.99 01/11/2015 1614      Component Value Date/Time   CALCIUM 9.2 02/22/2015 1659   CALCIUM 9.8 12/20/2014 1129   ALKPHOS 90 01/11/2015 1614   ALKPHOS 115* 12/20/2014 1129   AST 26 01/11/2015 1614   AST 28 12/20/2014 1129   ALT 44* 01/11/2015 1614   ALT 38 12/20/2014 1129   BILITOT 0.8 01/11/2015 1614   BILITOT 0.60 12/20/2014 1129    BILITOT 0.9 11/18/2014 0440         Impression and Plan: Monica Riggs is a 81 year old white female. She is myelodysplasia.  Again, she's been doing as well as one could expect.   She clearly needs to be transfused. This may help with some of the confusion that she has.  She needs some IV fluids.   At seems as if she is going to need more help at home. Her family is doing a great job. I know that we had hospice see her before. She was not happy with that. However, this might be a situation where hospice could help out.  I will plan to see her back in about 3 weeks and hopefully she will be feeling better.   Volanda Napoleon, MD 5/11/20169:46 AM

## 2015-02-24 LAB — TYPE AND SCREEN
ABO/RH(D): O NEG
Antibody Screen: NEGATIVE
UNIT DIVISION: 0
Unit division: 0

## 2015-02-24 LAB — CULTURE, URINE COMPREHENSIVE
Colony Count: NO GROWTH
Organism ID, Bacteria: NO GROWTH

## 2015-03-08 ENCOUNTER — Encounter (HOSPITAL_COMMUNITY): Payer: Self-pay | Admitting: Emergency Medicine

## 2015-03-08 ENCOUNTER — Other Ambulatory Visit: Payer: Self-pay

## 2015-03-08 ENCOUNTER — Inpatient Hospital Stay (HOSPITAL_COMMUNITY)
Admission: EM | Admit: 2015-03-08 | Discharge: 2015-03-10 | DRG: 189 | Disposition: A | Discharge status: Home / Self Care | Payer: Medicare Other | Attending: Internal Medicine | Admitting: Internal Medicine

## 2015-03-08 ENCOUNTER — Emergency Department (HOSPITAL_COMMUNITY): Payer: Medicare Other

## 2015-03-08 ENCOUNTER — Other Ambulatory Visit (HOSPITAL_COMMUNITY): Payer: Self-pay

## 2015-03-08 DIAGNOSIS — I272 Other secondary pulmonary hypertension: Secondary | ICD-10-CM | POA: Diagnosis present

## 2015-03-08 DIAGNOSIS — I482 Chronic atrial fibrillation: Secondary | ICD-10-CM | POA: Diagnosis present

## 2015-03-08 DIAGNOSIS — E8770 Fluid overload, unspecified: Secondary | ICD-10-CM | POA: Diagnosis present

## 2015-03-08 DIAGNOSIS — Z881 Allergy status to other antibiotic agents status: Secondary | ICD-10-CM | POA: Diagnosis not present

## 2015-03-08 DIAGNOSIS — R0689 Other abnormalities of breathing: Secondary | ICD-10-CM

## 2015-03-08 DIAGNOSIS — J9601 Acute respiratory failure with hypoxia: Principal | ICD-10-CM | POA: Diagnosis present

## 2015-03-08 DIAGNOSIS — R0600 Dyspnea, unspecified: Secondary | ICD-10-CM | POA: Insufficient documentation

## 2015-03-08 DIAGNOSIS — J90 Pleural effusion, not elsewhere classified: Secondary | ICD-10-CM

## 2015-03-08 DIAGNOSIS — E039 Hypothyroidism, unspecified: Secondary | ICD-10-CM | POA: Diagnosis present

## 2015-03-08 DIAGNOSIS — I4891 Unspecified atrial fibrillation: Secondary | ICD-10-CM | POA: Diagnosis present

## 2015-03-08 DIAGNOSIS — D469 Myelodysplastic syndrome, unspecified: Secondary | ICD-10-CM | POA: Insufficient documentation

## 2015-03-08 DIAGNOSIS — R410 Disorientation, unspecified: Secondary | ICD-10-CM | POA: Diagnosis not present

## 2015-03-08 DIAGNOSIS — E785 Hyperlipidemia, unspecified: Secondary | ICD-10-CM | POA: Diagnosis not present

## 2015-03-08 DIAGNOSIS — I10 Essential (primary) hypertension: Secondary | ICD-10-CM | POA: Diagnosis present

## 2015-03-08 DIAGNOSIS — Z515 Encounter for palliative care: Secondary | ICD-10-CM | POA: Insufficient documentation

## 2015-03-08 DIAGNOSIS — D649 Anemia, unspecified: Secondary | ICD-10-CM | POA: Diagnosis present

## 2015-03-08 DIAGNOSIS — Z79899 Other long term (current) drug therapy: Secondary | ICD-10-CM

## 2015-03-08 DIAGNOSIS — R0602 Shortness of breath: Secondary | ICD-10-CM | POA: Diagnosis not present

## 2015-03-08 DIAGNOSIS — Z7982 Long term (current) use of aspirin: Secondary | ICD-10-CM

## 2015-03-08 DIAGNOSIS — R609 Edema, unspecified: Secondary | ICD-10-CM | POA: Diagnosis not present

## 2015-03-08 LAB — BASIC METABOLIC PANEL
Anion gap: 8 (ref 5–15)
BUN: 14 mg/dL (ref 6–20)
CO2: 25 mmol/L (ref 22–32)
Calcium: 9.4 mg/dL (ref 8.9–10.3)
Chloride: 102 mmol/L (ref 101–111)
Creatinine, Ser: 0.9 mg/dL (ref 0.44–1.00)
GFR, EST NON AFRICAN AMERICAN: 53 mL/min — AB (ref >60)
GLUCOSE: 111 mg/dL — AB (ref 65–99)
Potassium: 4.4 mmol/L (ref 3.5–5.1)
Sodium: 135 mmol/L (ref 135–145)

## 2015-03-08 LAB — CBC
HEMATOCRIT: 19 % — AB (ref 36.0–46.0)
HEMOGLOBIN: 6.2 g/dL — AB (ref 12.0–15.0)
MCH: 29.1 pg (ref 26.0–34.0)
MCHC: 32.6 g/dL (ref 30.0–36.0)
MCV: 89.2 fL (ref 78.0–100.0)
PLATELETS: 126 10*3/uL — AB (ref 150–400)
RBC: 2.13 MIL/uL — ABNORMAL LOW (ref 3.87–5.11)
RDW: 21.6 % — ABNORMAL HIGH (ref 11.5–15.5)
WBC: 2.9 10*3/uL — AB (ref 4.0–10.5)

## 2015-03-08 LAB — I-STAT TROPONIN, ED: Troponin i, poc: 0.01 ng/mL (ref 0.00–0.08)

## 2015-03-08 LAB — ABO/RH: ABO/RH(D): O NEG

## 2015-03-08 LAB — BRAIN NATRIURETIC PEPTIDE: B Natriuretic Peptide: 383.3 pg/mL — ABNORMAL HIGH (ref 0.0–100.0)

## 2015-03-08 MED ORDER — FUROSEMIDE 10 MG/ML IJ SOLN
40.0000 mg | Freq: Once | INTRAMUSCULAR | Status: AC
  Administered 2015-03-08: 40 mg via INTRAVENOUS
  Filled 2015-03-08: qty 4

## 2015-03-08 NOTE — ED Provider Notes (Signed)
CSN: [id number]     Arrival date & time 03/08/15  1904 History   First MD Initiated Contact with Patient 03/08/15 2034     Chief Complaint  Patient presents with  . Shortness of Breath     (Consider location/radiation/quality/duration/timing/severity/associated sxs/prior Treatment) Patient is a 81 y.o. female presenting with shortness of breath. The history is provided by the patient and a relative.  Shortness of Breath Severity:  Moderate Onset quality:  Gradual Duration:  1 week Timing:  Constant Progression:  Worsening Chronicity:  New Relieved by:  Nothing Worsened by:  Exertion Ineffective treatments:  None tried Associated symptoms: no abdominal pain, no chest pain, no claudication, no cough, no diaphoresis, no fever, no headaches, no neck pain, no rash, no sputum production, no vomiting and no wheezing     Past Medical History  Diagnosis Date  . Hyperlipidemia   . Hypertension   . Myelodysplasia   . Arthritis   . Chronic atrial fibrillation   . Left knee injury    Past Surgical History  Procedure Laterality Date  . Abdominal hysterectomy    . Tonsillectomy     Family History  Problem Relation Age of Onset  . Stroke      Parents  . Breast cancer      Female members on the maternal side  . Breast cancer      Father   History  Substance Use Topics  . Smoking status: Never Smoker   . Smokeless tobacco: Never Used     Comment: never used tobacco  . Alcohol Use: No   OB History    No data available     Review of Systems  Constitutional: Negative for fever, chills, diaphoresis, appetite change and fatigue.  Eyes: Negative for photophobia and visual disturbance.  Respiratory: Positive for shortness of breath. Negative for cough, sputum production, chest tightness and wheezing.   Cardiovascular: Positive for leg swelling. Negative for chest pain, palpitations and claudication.  Gastrointestinal: Negative for nausea, vomiting, abdominal pain, diarrhea,  blood in stool and abdominal distention.  Genitourinary: Negative for dysuria and difficulty urinating.  Musculoskeletal: Negative for back pain, neck pain and neck stiffness.  Skin: Positive for pallor. Negative for color change and rash.  Neurological: Negative for dizziness, light-headedness and headaches.  All other systems reviewed and are negative.     Allergies  Sulfa antibiotics  Home Medications   Prior to Admission medications   Medication Sig Start Date End Date Taking? Authorizing Provider  aspirin EC 81 MG tablet Take 81 mg by mouth daily.   Yes Historical Provider, MD  BENICAR 40 MG tablet TAKE ONE (1) TABLET EACH DAY Patient taking differently: TAKE ONE (1/2) TABLET EACH DAY 11/16/14  Yes [name], MD  brimonidine (ALPHAGAN) 0.2 % ophthalmic solution Place 1 drop into both eyes 2 (two) times daily.   Yes Historical Provider, MD  cholecalciferol (VITAMIN D) 1000 UNITS tablet Take 1,000 Units by mouth daily.   Yes Historical Provider, MD  diltiazem (CARDIZEM CD) 240 MG 24 hr capsule TAKE ONE (1) CAPSULE EACH DAY 11/19/14  Yes Theodis Blaze, MD  folic acid (FOLVITE) 637 MCG tablet Take 400 mcg by mouth 2 (two) times daily.   Yes Historical Provider, MD  hydrochlorothiazide (HYDRODIURIL) 25 MG tablet TAKE ONE (1) TABLET EACH DAY 02/07/15  Yes [name], MD  levothyroxine (SYNTHROID, LEVOTHROID) 50 MCG tablet TAKE ONE (1) TABLET EACH DAY 06/11/14  Yes [name], MD  naproxen sodium (  ANAPROX) 220 MG tablet Take 220 mg by mouth daily as needed. For pain   Yes Historical Provider, MD  Omega-3 Fatty Acids (FISH OIL) 1000 MG CAPS Take 1 capsule by mouth daily.   Yes Historical Provider, MD  polyethylene glycol (MIRALAX / GLYCOLAX) packet Take 17 g by mouth daily as needed for mild constipation.   Yes Historical Provider, MD   BP 111/57 mmHg  Pulse 92  Temp(Src) 97.6 F (36.4 C) (Oral)  Resp 18  Ht 5\' 1"  (1.549 m)  Wt 110 lb 0.2 oz (49.9 kg)  BMI 20.80 kg/m2  SpO2  97% Physical Exam  Constitutional: She is oriented to person, place, and time. She appears well-developed and well-nourished. No distress.  HENT:  Head: Normocephalic and atraumatic.  Mouth/Throat: Oropharynx is clear and moist.  Eyes: Conjunctivae and EOM are normal. Pupils are equal, round, and reactive to light.  Neck: Normal range of motion. Neck supple.  Cardiovascular: Normal rate, regular rhythm, normal heart sounds and intact distal pulses.  Exam reveals no gallop and no friction rub.   No murmur heard. Pulmonary/Chest: Effort normal. No respiratory distress. She has decreased breath sounds in the right lower field and the left lower field. She has no wheezes. She has no rhonchi. She has no rales.  Abdominal: Bowel sounds are normal. She exhibits no distension. There is no tenderness. There is no rebound and no guarding.  Musculoskeletal: Normal range of motion. She exhibits edema (3+ b/l LE pitting edema). She exhibits no tenderness.  Neurological: She is alert and oriented to person, place, and time. GCS eye subscore is 4. GCS verbal subscore is 5. GCS motor subscore is 6.  Skin: Skin is warm and dry. No rash noted. She is not diaphoretic. No erythema. There is pallor.  Vitals reviewed.   ED Course  Procedures (including critical care time) Labs Review Labs Reviewed  CBC - Abnormal; Notable for the following:    WBC 2.9 (*)    RBC 2.13 (*)    Hemoglobin 6.2 (*)    HCT 19.0 (*)    RDW 21.6 (*)    Platelets 126 (*)    All other components within normal limits  BASIC METABOLIC PANEL - Abnormal; Notable for the following:    Glucose, Bld 111 (*)    GFR calc non Af Amer 53 (*)    All other components within normal limits  BRAIN NATRIURETIC PEPTIDE - Abnormal; Notable for the following:    B Natriuretic Peptide 383.3 (*)    All other components within normal limits  COMPREHENSIVE METABOLIC PANEL - Abnormal; Notable for the following:    Glucose, Bld 106 (*)    Total Protein  6.0 (*)    Albumin 2.6 (*)    GFR calc non Af Amer 51 (*)    GFR calc Af Amer 59 (*)    All other components within normal limits  CBC - Abnormal; Notable for the following:    RBC 3.47 (*)    Hemoglobin 10.0 (*)    HCT 29.9 (*)    RDW 18.2 (*)    Platelets 115 (*)    All other components within normal limits  BASIC METABOLIC PANEL - Abnormal; Notable for the following:    Potassium 3.4 (*)    Chloride 98 (*)    Glucose, Bld 125 (*)    GFR calc non Af Amer 51 (*)    GFR calc Af Amer 60 (*)    All other components within  normal limits  CBC WITH DIFFERENTIAL/PLATELET  I-STAT TROPOININ, ED  TYPE AND SCREEN  ABO/RH  PREPARE RBC (CROSSMATCH)    Imaging Review Dg Chest 2 View  03/08/2015   CLINICAL DATA:  Shortness of Breath  EXAM: CHEST  2 VIEW  COMPARISON:  11/17/2014  FINDINGS: Cardiomediastinal silhouette is stable. There is moderate right pleural effusion with right basilar atelectasis or infiltrate. Small left pleural effusion with left basilar atelectasis. No pulmonary edema. Degenerative changes thoracolumbar spine.  IMPRESSION: Moderate right pleural effusion with right basilar atelectasis or infiltrate. Small left pleural effusion with left basilar atelectasis.   Electronically Signed   By: Lahoma Crocker M.D.   On: 03/08/2015 20:11     EKG Interpretation   Date/Time:  Tuesday Mar 08 2015 21:11:26 EDT Ventricular Rate:  92 PR Interval:    QRS Duration: 69 QT Interval:  444 QTC Calculation: 549 R Axis:   -64 Text Interpretation:  Atrial fibrillation Left anterior fascicular block  Low voltage, extremity leads Prolonged QT interval No significant change  since last tracing Confirmed by Maryan Rued  MD, Loree Fee ([id number]) on 03/08/2015  10:18:22 PM      MDM   Final diagnoses:  Pleural effusion    81 yo F with PMH of myelodysplastic syndrome, HTN, HPL, Afib, presenting with SOB and LE edema.  Worsening over past week.  Pt increasingly dyspneic to where she is now unable to  perform her usual ADLs.  Also endorses increased b/l LE edema.  States her HR has been intermittently elevated over past 2 days.  Has been compliant with home meds.  Receives blood transfusions ever 5 weeks for MDS and chronic anemia-- last transfusion 2 weeks ago.  No fevers, cough.  Denies chest pain at any point this week.  On presentation, pt alert, afebrile, O2 sats 92% on RA;  Pt appears to have mild conversational dyspnea.  Exam significant for decreased breath sounds at b/l lung bases.  3+ b/l LE pitting edema.  No other acute findings.  Pt appears clinically volume overloaded, suspect CHF as cause of dyspnea. Plan for labs, CXR.  CXR shows new b/l pleural effusions, R > L.  BNP elevated to 383.  Troponin negative.  Labs also notable for pancytopenia and Hgb of 6.2.  Daughter reports that pt's Hgb usually remains around 9 this soon after blood transfusion.  Symptoms likely 2/2 combination CHF and anemia.  IV Lasix administered and will admit for further management.  No other acute events during my care.  Discussed with attending Dr. Maryan Rued.    Ellwood Dense, MD 03/10/15 1322  Blanchie Dessert, MD 03/10/15 [phone]

## 2015-03-08 NOTE — H&P (Addendum)
[facility] History and Physical  Monica Riggs DEY:[id number] DOB: 1919-09-10 DOA: 03/08/2015  Referring physician: Dr. Rogers Blocker. ER physician. PCP: Redge Gainer, MD  Specialists: Dr. Marin Olp. Oncologist.  Chief Complaint: Shortness of breath.  HPI: Monica Riggs is a 81 y.o. female with a history of myelodysplasia, hypertension, hypothyroidism, hyperlipidemia, chronic atrial fibrillation was brought to the ER after patient had increasing shortness of breath over the last few days. Patient also was found to have increasing lower extremity edema over the last 24 hours. In the ER chest x-ray shows bilateral pleural effusion on exam patient has significant lower extremity edema. Patient's hemoglobin is around 6 and patient states she did receive transfusion 2 weeks ago. Patient will be admitted for acute respiratory failure probably a combination of worsening anemia and fluid overload. Patient has received Lasix and patient will be receiving transfusion for anemia. Patient denies any chest pain nausea vomiting abdominal pain or any blood in the stools.   Review of Systems: As presented in the history of presenting illness, rest negative.  Past Medical History  Diagnosis Date  . Hyperlipidemia   . Hypertension   . Myelodysplasia   . Arthritis   . Chronic atrial fibrillation   . Left knee injury    Past Surgical History  Procedure Laterality Date  . Abdominal hysterectomy     Social History:  reports that she has never smoked. She has never used smokeless tobacco. She reports that she does not drink alcohol or use illicit drugs. Where does patient live at home. Can patient participate in ADLs? Not sure.  Allergies  Allergen Reactions  . Sulfa Antibiotics Rash    Family History:  Family History  Problem Relation Age of Onset  . Stroke      Parents  . Breast cancer      Female members on the maternal side  . Breast cancer      Father      Prior to Admission  medications   Medication Sig Start Date End Date Taking? Authorizing Provider  aspirin EC 81 MG tablet Take 81 mg by mouth daily.   Yes Historical Provider, MD  BENICAR 40 MG tablet TAKE ONE (1) TABLET EACH DAY Patient taking differently: TAKE ONE (1/2) TABLET EACH DAY 11/16/14  Yes [name], MD  brimonidine (ALPHAGAN) 0.2 % ophthalmic solution Place 1 drop into both eyes 2 (two) times daily.   Yes Historical Provider, MD  cholecalciferol (VITAMIN D) 1000 UNITS tablet Take 1,000 Units by mouth daily.   Yes Historical Provider, MD  diltiazem (CARDIZEM CD) 240 MG 24 hr capsule TAKE ONE (1) CAPSULE EACH DAY 11/19/14  Yes Theodis Blaze, MD  folic acid (FOLVITE) 314 MCG tablet Take 400 mcg by mouth 2 (two) times daily.   Yes Historical Provider, MD  hydrochlorothiazide (HYDRODIURIL) 25 MG tablet TAKE ONE (1) TABLET EACH DAY 02/07/15  Yes [name], MD  levothyroxine (SYNTHROID, LEVOTHROID) 50 MCG tablet TAKE ONE (1) TABLET EACH DAY 06/11/14  Yes [name], MD  naproxen sodium (ANAPROX) 220 MG tablet Take 220 mg by mouth daily as needed. For pain   Yes Historical Provider, MD  Omega-3 Fatty Acids (FISH OIL) 1000 MG CAPS Take 1 capsule by mouth daily.   Yes Historical Provider, MD  polyethylene glycol (MIRALAX / GLYCOLAX) packet Take 17 g by mouth daily as needed for mild constipation.   Yes Historical Provider, MD    Physical Exam: Filed Vitals:   03/08/15 2200  03/08/15 2215 03/08/15 2230 03/08/15 2251  BP: 117/62 125/60 123/58 123/51  Pulse: 76 80 73 86  Temp:    97.7 F (36.5 C)  TempSrc:    Oral  Resp: 15 20 20 23   SpO2: 100% 100% 99% 100%     General:  Moderately built and poorly nourished.  Eyes: Anicteric mild pallor.  ENT: No discharge from the ears eyes nose and mouth.  Neck: No mass felt. JVD elevated.  Cardiovascular: S1 and S2 heard.  Respiratory: No rhonchi or crepitation.  Abdomen: Soft nontender bowel sounds present.  Skin: No rash.  Musculoskeletal:  Bilateral lower extremity edema.  Psychiatric: Appears normal.  Neurologic: Alert and oriented to time place and person. Moves all extremity.  Labs on Admission:  Basic Metabolic Panel:  Recent Labs Lab 03/08/15 1945  NA 135  K 4.4  CL 102  CO2 25  GLUCOSE 111*  BUN 14  CREATININE 0.90  CALCIUM 9.4   Liver Function Tests: No results for input(s): AST, ALT, ALKPHOS, BILITOT, PROT, ALBUMIN in the last 168 hours. No results for input(s): LIPASE, AMYLASE in the last 168 hours. No results for input(s): AMMONIA in the last 168 hours. CBC:  Recent Labs Lab 03/08/15 1945  WBC 2.9*  HGB 6.2*  HCT 19.0*  MCV 89.2  PLT 126*   Cardiac Enzymes: No results for input(s): CKTOTAL, CKMB, CKMBINDEX, TROPONINI in the last 168 hours.  BNP (last 3 results)  Recent Labs  03/08/15 1945  BNP 383.3*    ProBNP (last 3 results) No results for input(s): PROBNP in the last 8760 hours.  CBG: No results for input(s): GLUCAP in the last 168 hours.  Radiological Exams on Admission: Dg Chest 2 View  03/08/2015   CLINICAL DATA:  Shortness of Breath  EXAM: CHEST  2 VIEW  COMPARISON:  11/17/2014  FINDINGS: Cardiomediastinal silhouette is stable. There is moderate right pleural effusion with right basilar atelectasis or infiltrate. Small left pleural effusion with left basilar atelectasis. No pulmonary edema. Degenerative changes thoracolumbar spine.  IMPRESSION: Moderate right pleural effusion with right basilar atelectasis or infiltrate. Small left pleural effusion with left basilar atelectasis.   Electronically Signed   By: Lahoma Crocker M.D.   On: 03/08/2015 20:11    EKG: Independently reviewed. A. fib with rate control.  Assessment/Plan Principal Problem:   Acute respiratory failure with hypoxia Active Problems:   Hypothyroid   Hypertension   Chronic anemia   Atrial fibrillation, controlled   1. Acute respiratory failure with hypoxia - probably secondary to combination of fluid  overload and anemia. Patient has received Lasix 40 mg IV in the ER and I have placed patient on Lasix 40 mg IV daily and the patient also will receive 20 mg IV in between packed red blood cell transfusion. Patient for her anemia will be receiving 2 units of packed red blood cell transfusion. Check 2-D echo. Close to follow intake and output and daily weights and metabolic panel. Check Dopplers LE. 2. Myelodysplasia - receiving transfusion at this time. 3. Hypertension - continue present medications. 4. Chronic A. fib presently rate controlled - not a candidate for anti-granulation secondary to severe anemia. Continue rate control medications. Patient is on aspirin. 5. Hypothyroidism on Synthroid.  Palliative care consult.   DVT Prophylaxis Lovenox.  Code Status: DO NOT INTUBATE.  Family Communication: Patient's daughter.  Disposition Plan: Admit to inpatient. Likely stay 2-3 days.    KAKRAKANDY,ARSHAD N. [facility] Pager [phone].  If 7PM-7AM, please contact  night-coverage [email or web address] Password [email or web address] 03/08/2015, 11:00 PM

## 2015-03-08 NOTE — ED Notes (Signed)
Reviewed with Dr. Rogers Blocker, hgb of 6.2. She is currently at the bedside.

## 2015-03-08 NOTE — ED Notes (Signed)
Pt. reports progressing exertional dyspnea with occasional productive cough , bilateral lower legs swelling and generalized weakness/fatigue onset this week . Denies fever or chills. Denies chest pain .

## 2015-03-08 NOTE — ED Notes (Signed)
Phlebotomy at the bedside  

## 2015-03-08 NOTE — ED Notes (Signed)
Dr. Rogers Blocker at the bedside.

## 2015-03-08 NOTE — ED Notes (Signed)
Called main lab and spoke to [place] in regards to bnp results. Currently in process.

## 2015-03-08 NOTE — ED Notes (Signed)
Patient requested water to drink. Reported to Dr. Rogers Blocker, and also reviewed  bnp resulted at 383. o2 sat at 100% with use of 2L via nasal cannula. MD allows patient to have water at this time.

## 2015-03-08 NOTE — ED Notes (Signed)
Attempted to call report

## 2015-03-08 NOTE — ED Notes (Signed)
Spoke to Dr. Rogers Blocker and Dr. Maryan Rued in regards to plan of care. Awaiting admitting MD, patient's VS stable.

## 2015-03-08 NOTE — ED Notes (Signed)
Patient not in room yet 

## 2015-03-09 ENCOUNTER — Encounter (HOSPITAL_COMMUNITY): Payer: Self-pay | Admitting: *Deleted

## 2015-03-09 ENCOUNTER — Inpatient Hospital Stay (HOSPITAL_COMMUNITY): Payer: Medicare Other

## 2015-03-09 ENCOUNTER — Inpatient Hospital Stay (INDEPENDENT_AMBULATORY_CARE_PROVIDER_SITE_OTHER): Payer: Medicare Other

## 2015-03-09 DIAGNOSIS — J9601 Acute respiratory failure with hypoxia: Principal | ICD-10-CM

## 2015-03-09 DIAGNOSIS — Z515 Encounter for palliative care: Secondary | ICD-10-CM | POA: Insufficient documentation

## 2015-03-09 DIAGNOSIS — I4891 Unspecified atrial fibrillation: Secondary | ICD-10-CM

## 2015-03-09 DIAGNOSIS — D649 Anemia, unspecified: Secondary | ICD-10-CM

## 2015-03-09 DIAGNOSIS — D469 Myelodysplastic syndrome, unspecified: Secondary | ICD-10-CM | POA: Insufficient documentation

## 2015-03-09 DIAGNOSIS — R0600 Dyspnea, unspecified: Secondary | ICD-10-CM | POA: Diagnosis not present

## 2015-03-09 DIAGNOSIS — R410 Disorientation, unspecified: Secondary | ICD-10-CM

## 2015-03-09 DIAGNOSIS — R0689 Other abnormalities of breathing: Secondary | ICD-10-CM

## 2015-03-09 LAB — COMPREHENSIVE METABOLIC PANEL
ALK PHOS: 76 U/L (ref 38–126)
ALT: 43 U/L (ref 14–54)
AST: 33 U/L (ref 15–41)
Albumin: 2.6 g/dL — ABNORMAL LOW (ref 3.5–5.0)
Anion gap: 8 (ref 5–15)
BUN: 13 mg/dL (ref 6–20)
CO2: 25 mmol/L (ref 22–32)
CREATININE: 0.94 mg/dL (ref 0.44–1.00)
Calcium: 9.3 mg/dL (ref 8.9–10.3)
Chloride: 102 mmol/L (ref 101–111)
GFR, EST AFRICAN AMERICAN: 59 mL/min — AB (ref >60)
GFR, EST NON AFRICAN AMERICAN: 51 mL/min — AB (ref >60)
Glucose, Bld: 106 mg/dL — ABNORMAL HIGH (ref 65–99)
POTASSIUM: 3.8 mmol/L (ref 3.5–5.1)
Sodium: 135 mmol/L (ref 135–145)
TOTAL PROTEIN: 6 g/dL — AB (ref 6.5–8.1)
Total Bilirubin: 0.6 mg/dL (ref 0.3–1.2)

## 2015-03-09 LAB — PREPARE RBC (CROSSMATCH)

## 2015-03-09 LAB — CBC
HCT: 29.9 % — ABNORMAL LOW (ref 36.0–46.0)
Hemoglobin: 10 g/dL — ABNORMAL LOW (ref 12.0–15.0)
MCH: 28.8 pg (ref 26.0–34.0)
MCHC: 33.4 g/dL (ref 30.0–36.0)
MCV: 86.2 fL (ref 78.0–100.0)
PLATELETS: 115 10*3/uL — AB (ref 150–400)
RBC: 3.47 MIL/uL — AB (ref 3.87–5.11)
RDW: 18.2 % — ABNORMAL HIGH (ref 11.5–15.5)
WBC: 4.4 10*3/uL (ref 4.0–10.5)

## 2015-03-09 MED ORDER — ENOXAPARIN SODIUM 30 MG/0.3ML ~~LOC~~ SOLN
30.0000 mg | SUBCUTANEOUS | Status: DC
  Administered 2015-03-09 – 2015-03-10 (×2): 30 mg via SUBCUTANEOUS
  Filled 2015-03-09 (×2): qty 0.3

## 2015-03-09 MED ORDER — BRIMONIDINE TARTRATE 0.2 % OP SOLN
1.0000 [drp] | Freq: Two times a day (BID) | OPHTHALMIC | Status: DC
  Administered 2015-03-09 – 2015-03-10 (×3): 1 [drp] via OPHTHALMIC
  Filled 2015-03-09: qty 5

## 2015-03-09 MED ORDER — HYDROCHLOROTHIAZIDE 25 MG PO TABS
25.0000 mg | ORAL_TABLET | Freq: Every day | ORAL | Status: DC
Start: 2015-03-09 — End: 2015-03-10
  Administered 2015-03-09 – 2015-03-10 (×2): 25 mg via ORAL
  Filled 2015-03-09 (×2): qty 1

## 2015-03-09 MED ORDER — DILTIAZEM HCL ER COATED BEADS 240 MG PO CP24
240.0000 mg | ORAL_CAPSULE | Freq: Every day | ORAL | Status: DC
  Administered 2015-03-09 – 2015-03-10 (×2): 240 mg via ORAL
  Filled 2015-03-09 (×2): qty 1

## 2015-03-09 MED ORDER — OMEGA-3-ACID ETHYL ESTERS 1 G PO CAPS
1.0000 g | ORAL_CAPSULE | Freq: Every day | ORAL | Status: DC
  Administered 2015-03-09 – 2015-03-10 (×2): 1 g via ORAL
  Filled 2015-03-09 (×2): qty 1

## 2015-03-09 MED ORDER — FUROSEMIDE 10 MG/ML IJ SOLN
20.0000 mg | Freq: Once | INTRAMUSCULAR | Status: AC
  Administered 2015-03-09: 20 mg via INTRAVENOUS
  Filled 2015-03-09: qty 2

## 2015-03-09 MED ORDER — SODIUM CHLORIDE 0.9 % IV SOLN
Freq: Once | INTRAVENOUS | Status: AC
  Administered 2015-03-09: 02:00:00 via INTRAVENOUS

## 2015-03-09 MED ORDER — ONDANSETRON HCL 4 MG PO TABS: 4.0000 mg | ORAL_TABLET | Freq: Four times a day (QID) | ORAL | Status: DC | PRN

## 2015-03-09 MED ORDER — VITAMIN D3 25 MCG (1000 UNIT) PO TABS
1000.0000 [IU] | ORAL_TABLET | Freq: Every day | ORAL | Status: DC
  Administered 2015-03-09 – 2015-03-10 (×2): 1000 [IU] via ORAL
  Filled 2015-03-09 (×2): qty 1

## 2015-03-09 MED ORDER — ASPIRIN EC 81 MG PO TBEC
81.0000 mg | DELAYED_RELEASE_TABLET | Freq: Every day | ORAL | Status: DC
  Administered 2015-03-09 – 2015-03-10 (×2): 81 mg via ORAL
  Filled 2015-03-09 (×2): qty 1

## 2015-03-09 MED ORDER — SODIUM CHLORIDE 0.9 % IJ SOLN
3.0000 mL | Freq: Two times a day (BID) | INTRAMUSCULAR | Status: DC
  Administered 2015-03-09 – 2015-03-10 (×3): 3 mL via INTRAVENOUS

## 2015-03-09 MED ORDER — ACETAMINOPHEN 325 MG PO TABS
650.0000 mg | ORAL_TABLET | Freq: Four times a day (QID) | ORAL | Status: DC | PRN
  Administered 2015-03-09 (×2): 650 mg via ORAL
  Filled 2015-03-09 (×2): qty 2

## 2015-03-09 MED ORDER — POLYETHYLENE GLYCOL 3350 17 G PO PACK
17.0000 g | PACK | Freq: Every day | ORAL | Status: DC | PRN
  Filled 2015-03-09: qty 1

## 2015-03-09 MED ORDER — DIPHENHYDRAMINE HCL 25 MG PO CAPS
25.0000 mg | ORAL_CAPSULE | Freq: Four times a day (QID) | ORAL | Status: DC | PRN
  Administered 2015-03-09 (×2): 25 mg via ORAL
  Filled 2015-03-09 (×2): qty 1

## 2015-03-09 MED ORDER — ONDANSETRON HCL 4 MG/2ML IJ SOLN: 4.0000 mg | Freq: Four times a day (QID) | INTRAMUSCULAR | Status: DC | PRN

## 2015-03-09 MED ORDER — ACETAMINOPHEN 650 MG RE SUPP
650.0000 mg | Freq: Four times a day (QID) | RECTAL | Status: DC | PRN
Start: 2015-03-09 — End: 2015-03-10

## 2015-03-09 MED ORDER — LEVOTHYROXINE SODIUM 50 MCG PO TABS
50.0000 ug | ORAL_TABLET | Freq: Every day | ORAL | Status: DC
  Administered 2015-03-09 – 2015-03-10 (×2): 50 ug via ORAL
  Filled 2015-03-09 (×4): qty 1

## 2015-03-09 MED ORDER — FOLIC ACID 0.5 MG HALF TAB
400.0000 ug | ORAL_TABLET | Freq: Two times a day (BID) | ORAL | Status: DC
  Administered 2015-03-09 – 2015-03-10 (×3): 0.5 mg via ORAL
  Filled 2015-03-09 (×6): qty 1

## 2015-03-09 MED ORDER — FUROSEMIDE 10 MG/ML IJ SOLN
40.0000 mg | Freq: Every day | INTRAMUSCULAR | Status: DC
  Administered 2015-03-09 – 2015-03-10 (×2): 40 mg via INTRAVENOUS
  Filled 2015-03-09 (×2): qty 4

## 2015-03-09 MED ORDER — IRBESARTAN 150 MG PO TABS
150.0000 mg | ORAL_TABLET | Freq: Every day | ORAL | Status: DC
  Administered 2015-03-10: 150 mg via ORAL
  Filled 2015-03-09 (×2): qty 1

## 2015-03-09 NOTE — Progress Notes (Signed)
Second unit of blood started at 0915 to be completed in 4 hrs pt tolerated well no adverse reaction noted  About 63 mls remaining in bag vs stable pt care endorsed to [place] RN

## 2015-03-09 NOTE — Progress Notes (Signed)
Utilization review completed.  

## 2015-03-09 NOTE — Progress Notes (Signed)
  Echocardiogram 2D Echocardiogram has been performed.  Johny Chess 03/09/2015, 11:31 AM

## 2015-03-09 NOTE — Progress Notes (Addendum)
[facility] PROGRESS NOTE Assessment/Plan: Acute respiratory failure with hypoxia: - Multifactorial due to anemia, vol overload and pulm HTN. - Echo 2.25.2016: Some septal dysfunction related to RV and pulmonary hypertension. The cavity size was normal. The estimated ejection fraction was 50%. Pulmonary arteries: PA peak pressure: 68 mm Hg. - cont gentle IV diuresis. Has + JVD no apparent diastolic dysfunction on ECHO. - consult PMT.  Normocytic anemia/Myelodysplastic syndrome: - Status post 2 unit of packed blood cells. - repeated CBC pending.  Chronic Atrial fibrillation, controlled: - A candidate for antegrade ablation due to severe anemia. Currently rate controlled.  Hypothyroidism: - Continue Synthroid.  Pulmonary HTN: - Gently diuresis. - due to her age and limited option, she would like to be conservative with her treatment. - consult PMT.  Code Status: DNI Family Communication: daughter  Disposition Plan: home in 3-4 days   Consultants:  none  Procedures:  Echo  Antibiotics:   HPI/Subjective: Feels much better  Objective: Filed Vitals:   03/09/15 0635 03/09/15 0705 03/09/15 0910 03/09/15 0930  BP: 124/83 134/67 109/51   Pulse: 108 110 107 99  Temp: 97.8 F (36.6 C) 97.9 F (36.6 C) 97.4 F (36.3 C) 97.3 F (36.3 C)  TempSrc: Oral Oral Oral Oral  Resp:  22 24 22   Height:      Weight:      SpO2:  100% 100% 100%    Intake/Output Summary (Last 24 hours) at 03/09/15 1250 Last data filed at 03/09/15 1100  Gross per 24 hour  Intake 1355.83 ml  Output   2350 ml  Net -994.17 ml   Filed Weights   03/08/15 2357 03/09/15 0440  Weight: 52.1 kg (114 lb 13.8 oz) 51 kg (112 lb 7 oz)    Exam:  General: Alert, awake, oriented x3, in no acute distress.  HEENT: No bruits, no goiter. +JVD Heart: Regular rate and rhythm. Lungs: Good air movement, bilateral air movement.  Abdomen: Soft, nontender, nondistended, positive bowel sounds.    Neuro: Grossly intact, nonfocal.   Data Reviewed: Basic Metabolic Panel:  Recent Labs Lab 03/08/15 1945 03/09/15 0406  NA 135 135  K 4.4 3.8  CL 102 102  CO2 25 25  GLUCOSE 111* 106*  BUN 14 13  CREATININE 0.90 0.94  CALCIUM 9.4 9.3   Liver Function Tests:  Recent Labs Lab 03/09/15 0406  AST 33  ALT 43  ALKPHOS 76  BILITOT 0.6  PROT 6.0*  ALBUMIN 2.6*   No results for input(s): LIPASE, AMYLASE in the last 168 hours. No results for input(s): AMMONIA in the last 168 hours. CBC:  Recent Labs Lab 03/08/15 1945  WBC 2.9*  HGB 6.2*  HCT 19.0*  MCV 89.2  PLT 126*   Cardiac Enzymes: No results for input(s): CKTOTAL, CKMB, CKMBINDEX, TROPONINI in the last 168 hours. BNP (last 3 results)  Recent Labs  03/08/15 1945  BNP 383.3*    ProBNP (last 3 results) No results for input(s): PROBNP in the last 8760 hours.  CBG: No results for input(s): GLUCAP in the last 168 hours.  No results found for this or any previous visit (from the past 240 hour(s)).   Studies: Dg Chest 2 View  03/08/2015   CLINICAL DATA:  Shortness of Breath  EXAM: CHEST  2 VIEW  COMPARISON:  11/17/2014  FINDINGS: Cardiomediastinal silhouette is stable. There is moderate right pleural effusion with right basilar atelectasis or infiltrate. Small left pleural effusion with left basilar atelectasis. No pulmonary edema. Degenerative  changes thoracolumbar spine.  IMPRESSION: Moderate right pleural effusion with right basilar atelectasis or infiltrate. Small left pleural effusion with left basilar atelectasis.   Electronically Signed   By: Lahoma Crocker M.D.   On: 03/08/2015 20:11    Scheduled Meds: . aspirin EC  81 mg Oral Daily  . brimonidine  1 drop Both Eyes BID  . cholecalciferol  1,000 Units Oral Daily  . diltiazem  240 mg Oral Daily  . enoxaparin (LOVENOX) injection  30 mg Subcutaneous Q24H  . folic acid  688 mcg Oral BID  . furosemide  40 mg Intravenous Daily  . hydrochlorothiazide  25 mg  Oral Daily  . irbesartan  150 mg Oral Daily  . levothyroxine  50 mcg Oral QAC breakfast  . omega-3 acid ethyl esters  1 g Oral Daily  . sodium chloride  3 mL Intravenous Q12H  . sodium chloride  3 mL Intravenous Q12H   Continuous Infusions:   Time Spent: 25 min   Charlynne Cousins  [facility] Pager [phone]. If 7PM-7AM, please contact night-coverage at [email or web address], password [email or web address] 03/09/2015, 12:50 PM  LOS: 1 day

## 2015-03-09 NOTE — Progress Notes (Signed)
Pt (81 year old white female came in through ED from home with daughter ,c/ SOB and  A- Fib. and anemia with  Hemoglobin level of 6.2.dl. Pt alert and oriented x 4 . Made comfortable in bed and oriented to room and use of call bell.  Pt was premedicated with tylenol 650 mg and 25 mg of bernadryl . First unit of Blood group O negative  Started at [place] and completed at [phone] . Pt tolerated well with no reaction.  Vs stable . Lasix 20 mg Iv given  Post transfusion.  RN will continue with send unit ASAP. Post 30 minutes Vs are BP 134/ R. 22 HR 110 100 % with 2l / min O2SAT. Pt denied any c/o and stated I feel much better than when I came in  i don't have SOB .and I'm breathing better.

## 2015-03-09 NOTE — Consult Note (Signed)
Consultation Note Date: 03/09/2015   Patient Name: Monica Riggs  DOB: 02/08/1919  MRN: [id number]  Age / Sex: 81 y.o., female   PCP: [name], MD Referring Physician: Charlynne Cousins, MD  Reason for Consultation: Establishing goals of care  Palliative Care Assessment and Plan Summary of Established Goals of Care and Medical Treatment Preferences    Palliative Care Discussion Held Today Contacts/Participants in Discussion: Patient, daughter Monica  Riggs has recently moved in with her daughter Monica and grand-daughter Monica Riggs.  She had been living at home recently and as noted below receives transfusion every 4-6 weeks under the care of Dr Marin Olp.  In talking with Monica, she has ahd some discussions with Dr Marin Olp about hospice in the future and we talked in general about hospice philosophy of care.  Monica's husband died a few years ago under hospice care, and she has a generally positive view about hospice.  The limitation for hospice care will be her transfusion requirements.  It is often hard for hospice agencies to do routine transfusions and this scenario often precludes someone from hospice care.  We talked in general about point where people consider stopping transfusions (most cases it is when someone does not feel symptomatically better afterwards) but there is some variation to this as well.  Monica understands current workup being done. I have placed call to Dr Marin Olp to make him aware of admission and will plan on discussing case further with him as well.  Their goal at this time is to be able to bring her back home under care of family.   Code Status/Advance Care Planning:  Partial, no intubation only. Did not discuss further today.    Symptom Management:   Dyspnea: improving with diuresis  Delirium: in setting of benadryl for transfusion.  Would be cautious with benadryl need though she usually receives as pre-medication for transfusion. Avoid benzos.      Psycho-social/Spiritual:   Support System: Has 1 living sibling with Alzheimers.  Lives with her daughter and granddaughter. Recently had to move out of her own home with declining performance status.  Monica is only living child.    Desire for further Chaplaincy support:no  Prognosis: < 6 months if disease follows expected trajectory  Discharge Planning:  to be determined       Chief Complaint: Dyspnea  History of Present Illness:  Patient is a 81 yo female with PMHx of MDS, Afib who was admitted yesterday with several day history of gradually increasing SOB and new onset lower ext edema.  On admission, was noted to have anemia and bilateral pleural effusions.  According to daughter Monica, she normally receives transfusion every 4-6 weeks, but had just recently received transfusion about 1-2 weeks ago.  Since admission she has received 2 units of PRBC's with follow-up CBC pending.  She has also been diuresed. Echo did not show signs of systolic or diastolic dysfunction but elevated RV pressures with some mild to mod RV dilation.  Monica Riggs is confused after receiving benadryl today which is not all that unusual for her.      Primary Diagnoses  Present on Admission:  . Acute respiratory failure with hypoxia . Chronic anemia . Atrial fibrillation, controlled . Hypothyroid . Hypertension   I have reviewed the medical record, interviewed the patient and family, and examined the patient. The following aspects are pertinent.  Past Medical History  Diagnosis Date  . Hyperlipidemia   . Hypertension   . Myelodysplasia   .  Arthritis   . Chronic atrial fibrillation   . Left knee injury    History   Social History  . Marital Status: Widowed    Spouse Name: N/A  . Number of Children: N/A  . Years of Education: N/A   Social History Main Topics  . Smoking status: Never Smoker   . Smokeless tobacco: Never Used     Comment: never used tobacco  . Alcohol Use: No  . Drug Use:  No  . Sexual Activity: Not on file   Other Topics Concern  . None   Social History Narrative   Lives currently alone in [place]   Never smoker never drinker   Used to work in [employer] fund[employer] and in a [place] 2 twins, 5 grandchildren, 49 great-grandchildren   Family History  Problem Relation Age of Onset  . Stroke      Parents  . Breast cancer      Female members on the maternal side  . Breast cancer      Father   Scheduled Meds: . aspirin EC  81 mg Oral Daily  . brimonidine  1 drop Both Eyes BID  . cholecalciferol  1,000 Units Oral Daily  . diltiazem  240 mg Oral Daily  . enoxaparin (LOVENOX) injection  30 mg Subcutaneous Q24H  . folic acid  563 mcg Oral BID  . furosemide  40 mg Intravenous Daily  . hydrochlorothiazide  25 mg Oral Daily  . irbesartan  150 mg Oral Daily  . levothyroxine  50 mcg Oral QAC breakfast  . omega-3 acid ethyl esters  1 g Oral Daily  . sodium chloride  3 mL Intravenous Q12H  . sodium chloride  3 mL Intravenous Q12H   Continuous Infusions:  PRN Meds:.acetaminophen **OR** acetaminophen, diphenhydrAMINE, ondansetron **OR** ondansetron (ZOFRAN) IV, polyethylene glycol Medications Prior to Admission:  Prior to Admission medications   Medication Sig Start Date End Date Taking? Authorizing Provider  aspirin EC 81 MG tablet Take 81 mg by mouth daily.   Yes Historical Provider, MD  BENICAR 40 MG tablet TAKE ONE (1) TABLET EACH DAY Patient taking differently: TAKE ONE (1/2) TABLET EACH DAY 11/16/14  Yes [name], MD  brimonidine (ALPHAGAN) 0.2 % ophthalmic solution Place 1 drop into both eyes 2 (two) times daily.   Yes Historical Provider, MD  cholecalciferol (VITAMIN D) 1000 UNITS tablet Take 1,000 Units by mouth daily.   Yes Historical Provider, MD  diltiazem (CARDIZEM CD) 240 MG 24 hr capsule TAKE ONE (1) CAPSULE EACH DAY 11/19/14  Yes Theodis Blaze, MD  folic acid (FOLVITE) 875 MCG tablet Take 400 mcg by mouth 2 (two) times daily.   Yes Historical  Provider, MD  hydrochlorothiazide (HYDRODIURIL) 25 MG tablet TAKE ONE (1) TABLET EACH DAY 02/07/15  Yes [name], MD  levothyroxine (SYNTHROID, LEVOTHROID) 50 MCG tablet TAKE ONE (1) TABLET EACH DAY 06/11/14  Yes [name], MD  naproxen sodium (ANAPROX) 220 MG tablet Take 220 mg by mouth daily as needed. For pain   Yes Historical Provider, MD  Omega-3 Fatty Acids (FISH OIL) 1000 MG CAPS Take 1 capsule by mouth daily.   Yes Historical Provider, MD  polyethylene glycol (MIRALAX / GLYCOLAX) packet Take 17 g by mouth daily as needed for mild constipation.   Yes Historical Provider, MD   Allergies  Allergen Reactions  . Sulfa Antibiotics Rash   CBC:    Component Value Date/Time   WBC 2.9* 03/08/2015 1945  WBC 3.8* 01/17/2015 1302   WBC 9.5 01/11/2015 1621   HGB 6.2* 03/08/2015 1945   HGB 9.5* 01/17/2015 1302   HGB 14.7 01/11/2015 1621   HCT 19.0* 03/08/2015 1945   HCT 28.8* 01/17/2015 1302   HCT 49.4* 01/11/2015 1621   PLT 126* 03/08/2015 1945   PLT 164 01/17/2015 1302   MCV 89.2 03/08/2015 1945   MCV 93 01/17/2015 1302   MCV 89.1 01/11/2015 1621   NEUTROABS 1.7 01/11/2015 1639   NEUTROABS 2.1 11/17/2014 1136   LYMPHSABS 0.6* 01/11/2015 1639   LYMPHSABS 0.7 11/17/2014 1136   MONOABS 0.3 11/17/2014 1136   EOSABS 0.4 01/11/2015 1639   EOSABS 0.2 11/17/2014 1136   BASOSABS 0.1 01/11/2015 1639   BASOSABS 0.1 11/17/2014 1136   Comprehensive Metabolic Panel:    Component Value Date/Time   NA 135 03/09/2015 0406   NA 139 01/11/2015 1614   NA 142 12/20/2014 1129   K 3.8 03/09/2015 0406   K 3.8 12/20/2014 1129   CL 102 03/09/2015 0406   CL 97* 12/20/2014 1129   CO2 25 03/09/2015 0406   CO2 30 12/20/2014 1129   BUN 13 03/09/2015 0406   BUN 14 01/11/2015 1614   BUN 42* 12/20/2014 1129   CREATININE 0.94 03/09/2015 0406   CREATININE 1.43* 02/22/2015 1659   GLUCOSE 106* 03/09/2015 0406   GLUCOSE 106* 01/11/2015 1614   GLUCOSE 100 12/20/2014 1129   CALCIUM 9.3  03/09/2015 0406   CALCIUM 9.8 12/20/2014 1129   AST 33 03/09/2015 0406   AST 28 12/20/2014 1129   ALT 43 03/09/2015 0406   ALT 38 12/20/2014 1129   ALKPHOS 76 03/09/2015 0406   ALKPHOS 115* 12/20/2014 1129   BILITOT 0.6 03/09/2015 0406   BILITOT 0.8 01/11/2015 1614   BILITOT 0.60 12/20/2014 1129   PROT 6.0* 03/09/2015 0406   PROT 5.9* 01/11/2015 1614   PROT 7.0 12/20/2014 1129   ALBUMIN 2.6* 03/09/2015 0406    Physical Exam: Vital Signs: BP 121/62 mmHg  Pulse 84  Temp(Src) 98.3 F (36.8 C) (Oral)  Resp 18  Ht 5\' 1"  (1.549 m)  Wt 51 kg (112 lb 7 oz)  BMI 21.26 kg/m2  SpO2 98% SpO2: SpO2: 98 % O2 Device: O2 Device: Nasal Cannula O2 Flow Rate: O2 Flow Rate (L/min): 2 L/min Intake/output summary:  Intake/Output Summary (Last 24 hours) at 03/09/15 1506 Last data filed at 03/09/15 1356  Gross per 24 hour  Intake 1934.58 ml  Output   2700 ml  Net -765.42 ml   LBM:   Baseline Weight: Weight: 52.1 kg (114 lb 13.8 oz) Most recent weight: Weight: 51 kg (112 lb 7 oz)  Exam Findings:  GEN: alert, NAD HEENT: [place], sclera anicteric EXT: warm         Palliative Performance Scale:             40 Additional Data Reviewed: Recent Labs     03/08/15  1945  03/09/15  0406  WBC  2.9*   --   HGB  6.2*   --   PLT  126*   --   NA  135  135  BUN  14  13  CREATININE  0.90  0.94     Time Total: 60 minutes  Greater than 50%  of this time was spent counseling and coordinating care related to the above assessment and plan.  Signed by: Isaac Laud, DO  [place], DO  03/09/2015, 3:06 PM  Please contact Palliative  Medicine Team phone at [phone] for questions and concerns.

## 2015-03-10 ENCOUNTER — Inpatient Hospital Stay (HOSPITAL_COMMUNITY): Payer: Medicare Other

## 2015-03-10 DIAGNOSIS — I10 Essential (primary) hypertension: Secondary | ICD-10-CM

## 2015-03-10 DIAGNOSIS — R609 Edema, unspecified: Secondary | ICD-10-CM

## 2015-03-10 LAB — TYPE AND SCREEN
ABO/RH(D): O NEG
Antibody Screen: NEGATIVE
Unit division: 0
Unit division: 0

## 2015-03-10 LAB — BASIC METABOLIC PANEL
Anion gap: 10 (ref 5–15)
BUN: 11 mg/dL (ref 6–20)
CHLORIDE: 98 mmol/L — AB (ref 101–111)
CO2: 28 mmol/L (ref 22–32)
Calcium: 10 mg/dL (ref 8.9–10.3)
Creatinine, Ser: 0.93 mg/dL (ref 0.44–1.00)
GFR calc Af Amer: 60 mL/min — ABNORMAL LOW (ref >60)
GFR calc non Af Amer: 51 mL/min — ABNORMAL LOW (ref >60)
Glucose, Bld: 125 mg/dL — ABNORMAL HIGH (ref 65–99)
Potassium: 3.4 mmol/L — ABNORMAL LOW (ref 3.5–5.1)
Sodium: 136 mmol/L (ref 135–145)

## 2015-03-10 NOTE — Progress Notes (Signed)
VASCULAR LAB PRELIMINARY  PRELIMINARY  PRELIMINARY  PRELIMINARY  Bilateral lower extremity venous Dopplers completed.    Preliminary report:  There is no obvious evidence of  DVT or SVT noted in the bilateral lower extremities.    Kwadwo Taras, RVT 03/10/2015, 11:37 AM

## 2015-03-10 NOTE — Evaluation (Signed)
Physical Therapy Evaluation Patient Details Name: Monica Riggs MRN: [id number] DOB: 03-12-1919 Today's Date: 03/10/2015   History of Present Illness  Pt is a 81 y/o female with a PMH of myelodysplasia, HTN, hypothyroidism, hyperlipidemia, chronic anemia and chronic a-fib. Pt presents to the ED with increasing SOB and LE edema over the past few days PTA. Pt was admitted for acute respiratory failure.  Clinical Impression  Pt admitted with above diagnosis. Pt currently with functional limitations due to the deficits listed below (see PT Problem List). At the time of PT eval pt was able to perform transfers and ambulation with min guard assist for safety. Pt is not able to provide details of PLOF or home environment, however information was taken from a prior admission. Pt will benefit from skilled PT to increase their independence and safety with mobility to allow discharge to the venue listed below. If pt will have 24 hour assist at home feel she is safe to return home with daughter. Will recommend HHPT services (unsure if pt has had a functional decline but it would be beneficial), unless plan is for hospice care at d/c.      Follow Up Recommendations Home health PT;Supervision/Assistance - 24 hour    Equipment Recommendations  None recommended by PT    Recommendations for Other Services       Precautions / Restrictions Precautions Precautions: Fall Restrictions Weight Bearing Restrictions: No      Mobility  Bed Mobility               General bed mobility comments: Pt sitting up on EOB when PT arrived. Had just walked back from the bathroom with NT.   Transfers Overall transfer level: Needs assistance Equipment used: Rolling walker (2 wheeled) Transfers: Sit to/from Stand Sit to Stand: Min guard         General transfer comment: Pt was able to power-up to full standing position without physical assistance. Close guard provided for safety.    Ambulation/Gait Ambulation/Gait assistance: Min guard Ambulation Distance (Feet): 200 Feet Assistive device: Rolling walker (2 wheeled) Gait Pattern/deviations: Step-through pattern;Decreased stride length;Trunk flexed;Narrow base of support Gait velocity: Decreased Gait velocity interpretation: Below normal speed for age/gender (Possible it is [facility] for advanced age) General Gait Details: Pt given RW to use as she states she does not remember whether she uses it at home. Did well with UE support. Required cueing for walker placement close to pt's body as well as for improved posture. Slight SOB noted at end of gait training. Pt ambulated on RA and sats decreased to 88%.   Stairs            Wheelchair Mobility    Modified Rankin (Stroke Patients Only)       Balance Overall balance assessment: Needs assistance Sitting-balance support: Feet supported;No upper extremity supported Sitting balance-Leahy Scale: Good     Standing balance support: Bilateral upper extremity supported;During functional activity Standing balance-Leahy Scale: Fair Standing balance comment: Used RW with therapist however NT reports pt was able to walk to/from bathroom without an AD prior to PT session.                              Pertinent Vitals/Pain Pain Assessment: No/denies pain    Home Living Family/patient expects to be discharged to:: Private residence Living Arrangements: Children;Other relatives Available Help at Discharge: Family;Available 24 hours/day Type of Home: House Home Access: Stairs to enter Entrance  Stairs-Rails: Right Entrance Stairs-Number of Steps: 4 Home Layout: Two level;Able to live on main level with bedroom/bathroom Home Equipment: Gilford Rile - 2 wheels;Cane - single point;Bedside commode Additional Comments: Above info is for daughter's house and taken from a previous admission. Pt cannot provide any details of home environment.     Prior Function Level of  Independence: Independent with assistive device(s)         Comments: Taken from prior admission. Pt states she does not remember if she uses a walker at home.      Hand Dominance   Dominant Hand: Right    Extremity/Trunk Assessment   Upper Extremity Assessment: Defer to OT evaluation;Overall WFL for tasks assessed           Lower Extremity Assessment: Overall WFL for tasks assessed (Age appropriate strength decrease)      Cervical / Trunk Assessment: Kyphotic  Communication   Communication: HOH  Cognition Arousal/Alertness: Awake/alert Behavior During Therapy: WFL for tasks assessed/performed Overall Cognitive Status: Impaired/Different from baseline Area of Impairment: Orientation;Safety/judgement;Following commands Orientation Level: Disoriented to;Time;Situation   Memory: Decreased short-term memory Following Commands: Follows one step commands consistently;Follows one step commands with increased time Safety/Judgement: Decreased awareness of safety;Decreased awareness of deficits          General Comments      Exercises        Assessment/Plan    PT Assessment Patient needs continued PT services  PT Diagnosis Difficulty walking;Generalized weakness   PT Problem List Decreased strength;Decreased range of motion;Decreased activity tolerance;Decreased balance;Decreased mobility;Decreased knowledge of use of DME;Decreased safety awareness;Decreased knowledge of precautions;Cardiopulmonary status limiting activity  PT Treatment Interventions DME instruction;Gait training;Stair training;Functional mobility training;Therapeutic activities;Therapeutic exercise;Neuromuscular re-education;Patient/family education   PT Goals (Current goals can be found in the Care Plan section) Acute Rehab PT Goals Patient Stated Goal: Pt appears distraught that she cannot remember why she is in the hospital.  PT Goal Formulation: With patient Time For Goal Achievement:  03/17/15 Potential to Achieve Goals: Good    Frequency Min 3X/week   Barriers to discharge        Co-evaluation               End of Session Equipment Utilized During Treatment: Gait belt;Oxygen Activity Tolerance: Patient tolerated treatment well Patient left: in chair;with call bell/phone within reach Nurse Communication: Mobility status         Time: 0906-0929 PT Time Calculation (min) (ACUTE ONLY): 23 min   Charges:   PT Evaluation $Initial PT Evaluation Tier I: 1 Procedure PT Treatments $Gait Training: 8-22 mins   PT G Codes:        Rolinda Roan 03-25-15, 10:46 AM   Rolinda Roan, PT, DPT Acute Rehabilitation Services Pager: [phone]

## 2015-03-10 NOTE — Discharge Summary (Signed)
Physician Discharge Summary  Monica Riggs KDX:[id number] DOB: 1919/06/07 DOA: 03/08/2015  PCP: Redge Gainer, MD  Admit date: 03/08/2015 Discharge date: 03/10/2015  Time spent: 25 minutes  Recommendations for Outpatient Follow-up:  1. Follow-up with hematology as an outpatient.  2. Also follow-up with PCP in 2 weeks to check a CBC to see her hemoglobin is doing.  Discharge Diagnoses:  Principal Problem:   Acute respiratory failure with hypoxia Active Problems:   Hypothyroid   Hypertension   Chronic anemia   Atrial fibrillation, controlled   Dyspnea and respiratory abnormality   Palliative care encounter   Acute delirium   Myelodysplastic syndrome   Discharge Condition: stable  Diet recommendation: regular  Filed Weights   03/08/15 2357 03/09/15 0440 03/10/15 0422  Weight: 52.1 kg (114 lb 13.8 oz) 51 kg (112 lb 7 oz) 49.9 kg (110 lb 0.2 oz)    History of present illness:  81 y.o. female with a history of myelodysplasia, hypertension, hypothyroidism, hyperlipidemia, chronic atrial fibrillation was brought to the ER after patient had increasing shortness of breath over the last few days. Patient also was found to have increasing lower extremity edema over the last 24 hours. In the ER chest x-ray shows bilateral pleural effusion on exam patient has significant lower extremity edema. Patient's hemoglobin is around 6 and patient states she did receive transfusion 2 weeks ago. Patient will be admitted for acute respiratory failure probably a combination of worsening anemia and fluid overload. Patient has received Lasix and patient will be receiving transfusion for anemia. Patient denies any chest pain nausea vomiting abdominal pain or any blood in the stools.   Hospital Course:  Acute respiratory failure with hypoxia: Multifactorial likely to anemia and volume overload. Echo was done in 12/09/2014 that showed septal dysfunction related to pulmonary hypertension.  She is following  up with her pulmonary physician as an outpatient for pulmonary hypertension. She was started on IV diuretics or JVD resolve her shortness of breath improved. Palliative care was consulted and the patient wanted to continue transfusions.  Normocytic anemia/myelodysplastic syndrome: This was probably contributing to her shortness of breath she got 2 units of packed red blood cells and hemoglobin came up to 10.  Chronic atrial fibrillation:  Rate controlled, not a candidate for anticoagulation due to severe anemia.  Hypothyroidism: Continue Synthroid.  Pulmonary hypertension: She she tolerated diuresis. Due to her age her options are limited. She will like to continue conservative management for this.    Procedures:  CXR  Consultations:  none  Discharge Exam: Filed Vitals:   03/10/15 1022  BP: 111/57  Pulse: 92  Temp: 97.6 F (36.4 C)  Resp:     General: A&O x3 Cardiovascular: RRR Respiratory: good air movement CTA B/L  Discharge Instructions   Discharge Instructions    Diet - low sodium heart healthy    Complete by:  As directed      Increase activity slowly    Complete by:  As directed           Current Discharge Medication List    CONTINUE these medications which have NOT CHANGED   Details  aspirin EC 81 MG tablet Take 81 mg by mouth daily.    BENICAR 40 MG tablet TAKE ONE (1) TABLET EACH DAY Qty: 30 tablet, Refills: 3    brimonidine (ALPHAGAN) 0.2 % ophthalmic solution Place 1 drop into both eyes 2 (two) times daily.    cholecalciferol (VITAMIN D) 1000 UNITS tablet Take 1,000 Units by  mouth daily.    diltiazem (CARDIZEM CD) 240 MG 24 hr capsule TAKE ONE (1) CAPSULE EACH DAY Qty: 30 capsule, Refills: 1    folic acid (FOLVITE) 161 MCG tablet Take 400 mcg by mouth 2 (two) times daily.    hydrochlorothiazide (HYDRODIURIL) 25 MG tablet TAKE ONE (1) TABLET EACH DAY Qty: 30 tablet, Refills: 5    levothyroxine (SYNTHROID, LEVOTHROID) 50 MCG tablet TAKE  ONE (1) TABLET EACH DAY Qty: 30 tablet, Refills: 11    naproxen sodium (ANAPROX) 220 MG tablet Take 220 mg by mouth daily as needed. For pain   Associated Diagnoses: MDS (myelodysplastic syndrome), low grade    Omega-3 Fatty Acids (FISH OIL) 1000 MG CAPS Take 1 capsule by mouth daily.    polyethylene glycol (MIRALAX / GLYCOLAX) packet Take 17 g by mouth daily as needed for mild constipation.       Allergies  Allergen Reactions  . Sulfa Antibiotics Rash   Follow-up Information    Follow up with Redge Gainer, MD In 2 weeks.   Specialty:  Family Medicine   Why:  check a CBC   Contact information:   [place] [id number] [phone]        The results of significant diagnostics from this hospitalization (including imaging, microbiology, ancillary and laboratory) are listed below for reference.    Significant Diagnostic Studies: Dg Chest 2 View  03/08/2015   CLINICAL DATA:  Shortness of Breath  EXAM: CHEST  2 VIEW  COMPARISON:  11/17/2014  FINDINGS: Cardiomediastinal silhouette is stable. There is moderate right pleural effusion with right basilar atelectasis or infiltrate. Small left pleural effusion with left basilar atelectasis. No pulmonary edema. Degenerative changes thoracolumbar spine.  IMPRESSION: Moderate right pleural effusion with right basilar atelectasis or infiltrate. Small left pleural effusion with left basilar atelectasis.   Electronically Signed   By: Lahoma Crocker M.D.   On: 03/08/2015 20:11    Microbiology: No results found for this or any previous visit (from the past 240 hour(s)).   Labs: Basic Metabolic Panel:  Recent Labs Lab 03/08/15 1945 03/09/15 0406  NA 135 135  K 4.4 3.8  CL 102 102  CO2 25 25  GLUCOSE 111* 106*  BUN 14 13  CREATININE 0.90 0.94  CALCIUM 9.4 9.3   Liver Function Tests:  Recent Labs Lab 03/09/15 0406  AST 33  ALT 43  ALKPHOS 76  BILITOT 0.6  PROT 6.0*  ALBUMIN 2.6*   No results for input(s):  LIPASE, AMYLASE in the last 168 hours. No results for input(s): AMMONIA in the last 168 hours. CBC:  Recent Labs Lab 03/08/15 1945 03/09/15 2137  WBC 2.9* 4.4  HGB 6.2* 10.0*  HCT 19.0* 29.9*  MCV 89.2 86.2  PLT 126* 115*   Cardiac Enzymes: No results for input(s): CKTOTAL, CKMB, CKMBINDEX, TROPONINI in the last 168 hours. BNP: BNP (last 3 results)  Recent Labs  03/08/15 1945  BNP 383.3*    ProBNP (last 3 results) No results for input(s): PROBNP in the last 8760 hours.  CBG: No results for input(s): GLUCAP in the last 168 hours.     Signed:  Charlynne Cousins  [facility] 03/10/2015, 10:34 AM

## 2015-03-10 NOTE — Significant Event (Signed)
Patient discharge to home, reviewed AVS with patient and family. A copy of AVS given to patient. Patient taken to transportation with RN. Trenesha Alcaide, Therapist, sports

## 2015-03-11 DIAGNOSIS — I482 Chronic atrial fibrillation: Secondary | ICD-10-CM | POA: Diagnosis not present

## 2015-03-11 DIAGNOSIS — M1990 Unspecified osteoarthritis, unspecified site: Secondary | ICD-10-CM | POA: Diagnosis not present

## 2015-03-11 DIAGNOSIS — I10 Essential (primary) hypertension: Secondary | ICD-10-CM | POA: Diagnosis not present

## 2015-03-11 DIAGNOSIS — D469 Myelodysplastic syndrome, unspecified: Secondary | ICD-10-CM | POA: Diagnosis not present

## 2015-03-11 DIAGNOSIS — E785 Hyperlipidemia, unspecified: Secondary | ICD-10-CM | POA: Diagnosis not present

## 2015-03-11 DIAGNOSIS — E039 Hypothyroidism, unspecified: Secondary | ICD-10-CM | POA: Diagnosis not present

## 2015-03-14 DIAGNOSIS — I482 Chronic atrial fibrillation: Secondary | ICD-10-CM | POA: Diagnosis not present

## 2015-03-14 DIAGNOSIS — E039 Hypothyroidism, unspecified: Secondary | ICD-10-CM | POA: Diagnosis not present

## 2015-03-14 DIAGNOSIS — D469 Myelodysplastic syndrome, unspecified: Secondary | ICD-10-CM | POA: Diagnosis not present

## 2015-03-14 DIAGNOSIS — I10 Essential (primary) hypertension: Secondary | ICD-10-CM | POA: Diagnosis not present

## 2015-03-14 DIAGNOSIS — M1990 Unspecified osteoarthritis, unspecified site: Secondary | ICD-10-CM | POA: Diagnosis not present

## 2015-03-14 DIAGNOSIS — E785 Hyperlipidemia, unspecified: Secondary | ICD-10-CM | POA: Diagnosis not present

## 2015-03-15 DIAGNOSIS — M1990 Unspecified osteoarthritis, unspecified site: Secondary | ICD-10-CM | POA: Diagnosis not present

## 2015-03-15 DIAGNOSIS — E039 Hypothyroidism, unspecified: Secondary | ICD-10-CM | POA: Diagnosis not present

## 2015-03-15 DIAGNOSIS — E785 Hyperlipidemia, unspecified: Secondary | ICD-10-CM | POA: Diagnosis not present

## 2015-03-15 DIAGNOSIS — I10 Essential (primary) hypertension: Secondary | ICD-10-CM | POA: Diagnosis not present

## 2015-03-15 DIAGNOSIS — I482 Chronic atrial fibrillation: Secondary | ICD-10-CM | POA: Diagnosis not present

## 2015-03-15 DIAGNOSIS — D469 Myelodysplastic syndrome, unspecified: Secondary | ICD-10-CM | POA: Diagnosis not present

## 2015-03-16 ENCOUNTER — Encounter: Payer: Self-pay | Admitting: Family

## 2015-03-16 ENCOUNTER — Other Ambulatory Visit (HOSPITAL_BASED_OUTPATIENT_CLINIC_OR_DEPARTMENT_OTHER): Payer: Medicare Other

## 2015-03-16 ENCOUNTER — Ambulatory Visit (HOSPITAL_BASED_OUTPATIENT_CLINIC_OR_DEPARTMENT_OTHER): Payer: Medicare Other | Admitting: Family

## 2015-03-16 VITALS — BP 107/52 | HR 82 | Temp 97.4°F | Resp 18 | Ht 61.0 in | Wt 109.0 lb

## 2015-03-16 DIAGNOSIS — I509 Heart failure, unspecified: Secondary | ICD-10-CM | POA: Diagnosis not present

## 2015-03-16 DIAGNOSIS — D469 Myelodysplastic syndrome, unspecified: Secondary | ICD-10-CM

## 2015-03-16 DIAGNOSIS — D464 Refractory anemia, unspecified: Secondary | ICD-10-CM | POA: Diagnosis not present

## 2015-03-16 DIAGNOSIS — IMO0001 Reserved for inherently not codable concepts without codable children: Secondary | ICD-10-CM

## 2015-03-16 LAB — MANUAL DIFFERENTIAL (CHCC SATELLITE)
ALC: 0.8 10*3/uL (ref 0.6–2.2)
ANC (CHCC HP manual diff): 2.5 10*3/uL (ref 1.5–6.7)
Band Neutrophils: 7 % (ref 0–10)
EOS: 1 % (ref 0–7)
LYMPH: 22 % (ref 14–48)
MONO: 7 % (ref 0–13)
MYELOCYTES: 1 % — AB (ref 0–0)
PLATELET MORPHOLOGY: NORMAL
PLT EST ~~LOC~~: DECREASED
SEG: 62 % (ref 40–75)

## 2015-03-16 LAB — CBC WITH DIFFERENTIAL (CANCER CENTER ONLY)
EOS ABS: 0.1 10*3/uL (ref 0.0–0.5)
EOS%: 3.9 % (ref 0.0–7.0)
HCT: 27.6 % — ABNORMAL LOW (ref 34.8–46.6)
HEMOGLOBIN: 9.3 g/dL — AB (ref 11.6–15.9)
LYMPH#: 0.8 10*3/uL — AB (ref 0.9–3.3)
LYMPH%: 22.3 % (ref 14.0–48.0)
MCH: 30.1 pg (ref 26.0–34.0)
MCHC: 33.7 g/dL (ref 32.0–36.0)
MCV: 89 fL (ref 81–101)
MONO#: 0.4 10*3/uL (ref 0.1–0.9)
MONO%: 12.3 % (ref 0.0–13.0)
Platelets: 131 10*3/uL — ABNORMAL LOW (ref 145–400)
RBC: 3.09 10*6/uL — AB (ref 3.70–5.32)
RDW: 17.6 % — ABNORMAL HIGH (ref 11.1–15.7)
WBC: 3.6 10*3/uL — AB (ref 3.9–10.0)

## 2015-03-16 LAB — CMP (CANCER CENTER ONLY)
ALT: 46 U/L (ref 10–47)
AST: 32 U/L (ref 11–38)
Albumin: 2.8 g/dL — ABNORMAL LOW (ref 3.3–5.5)
Alkaline Phosphatase: 80 U/L (ref 26–84)
BUN: 17 mg/dL (ref 7–22)
CO2: 30 mEq/L (ref 18–33)
Calcium: 9.5 mg/dL (ref 8.0–10.3)
Chloride: 92 mEq/L — ABNORMAL LOW (ref 98–108)
Creat: 1 mg/dl (ref 0.6–1.2)
Glucose, Bld: 149 mg/dL — ABNORMAL HIGH (ref 73–118)
POTASSIUM: 3.2 meq/L — AB (ref 3.3–4.7)
Sodium: 131 mEq/L (ref 128–145)
Total Bilirubin: 1 mg/dl (ref 0.20–1.60)
Total Protein: 6 g/dL — ABNORMAL LOW (ref 6.4–8.1)

## 2015-03-16 NOTE — Progress Notes (Signed)
Hematology and Oncology Follow Up Visit  Monica Riggs [id number] 14-Jul-1919 81 y.o. 03/16/2015   Principle Diagnosis:  Refractory anemia Myelodysplasia   Current Therapy:   Supportive care with transfusions    Interim History: Monica Riggs is here today for labs and follow-up. She was in the hospital last week due to CHF and decreased Hgb. She received 2 unites of PRBCs while there for a Hgb of 6.0. Her Hgb today is up to 9.3.  She is still weak and sleeps a lot of the time.  She now lives with her sister permanently so she can assist with her care.   Her SOB and lower extremity edema have resolved.  She denies fever, chills, n/v, cough, rash, headache, dizziness, chest pain, palpitations, abdominal pain, diarrhea, blood in urine or stool. She uses Mirilax to help with her constipation.  No tenderness, numbness or tingling in her extremities. No new aches or pains.  She has no appetite. She is now drinking Boost with her meals to help. Her weight is down 3 lbs since she was in the hospital. She is drinking some fluids.  Home health is checking in her on tuesdays and thursdays. They will recheck her CBC in 2 weeks.   Medications:    Medication List       This list is accurate as of: 03/16/15 10:38 AM.  Always use your most recent med list.               aspirin EC 81 MG tablet  Take 81 mg by mouth daily.     BENICAR 40 MG tablet  Generic drug:  olmesartan  TAKE ONE (1) TABLET EACH DAY     brimonidine 0.2 % ophthalmic solution  Commonly known as:  ALPHAGAN  Place 1 drop into both eyes 2 (two) times daily.     cholecalciferol 1000 UNITS tablet  Commonly known as:  VITAMIN D  Take 1,000 Units by mouth daily.     diltiazem 240 MG 24 hr capsule  Commonly known as:  CARDIZEM CD  TAKE ONE (1) CAPSULE EACH DAY     Fish Oil 1000 MG Caps  Take 1 capsule by mouth daily.     folic acid 732 MCG tablet  Commonly known as:  FOLVITE  Take 400 mcg by mouth 2 (two) times daily.      hydrochlorothiazide 25 MG tablet  Commonly known as:  HYDRODIURIL  TAKE ONE (1) TABLET EACH DAY     levothyroxine 50 MCG tablet  Commonly known as:  SYNTHROID, LEVOTHROID  TAKE ONE (1) TABLET EACH DAY     naproxen sodium 220 MG tablet  Commonly known as:  ANAPROX  Take 220 mg by mouth daily as needed. For pain     polyethylene glycol packet  Commonly known as:  MIRALAX / GLYCOLAX  Take 17 g by mouth daily as needed for mild constipation.        Allergies:  Allergies  Allergen Reactions  . Sulfa Antibiotics Rash    Past Medical History, Surgical history, Social history, and Family History were reviewed and updated.  Review of Systems: All other 10 point review of systems is negative.   Physical Exam:  vitals were not taken for this visit.  Wt Readings from Last 3 Encounters:  03/10/15 110 lb 0.2 oz (49.9 kg)  02/23/15 113 lb (51.256 kg)  01/17/15 113 lb (51.256 kg)   Ocular: Sclerae unicteric, pupils equal, round and reactive to light Ear-nose-throat: Oropharynx clear,  dentition fair Lymphatic: No cervical or supraclavicular adenopathy Lungs no rales or rhonchi, good excursion bilaterally Heart regular rate and rhythm, no murmur appreciated Abd soft, nontender, positive bowel sounds MSK no focal spinal tenderness, no joint edema Neuro: non-focal, well-oriented, appropriate affect Breasts: Deferred  Lab Results  Component Value Date   WBC 4.4 03/09/2015   HGB 10.0* 03/09/2015   HCT 29.9* 03/09/2015   MCV 86.2 03/09/2015   PLT 115* 03/09/2015   Lab Results  Component Value Date   FERRITIN 3,053* 01/04/2015   IRON 169* 01/04/2015   TIBC 161* 01/04/2015   UIBC <1.0 01/04/2015   IRONPCTSAT >100 01/04/2015   Lab Results  Component Value Date   RETICCTPCT 1.6 01/04/2015   RBC 3.47* 03/09/2015   RETICCTABS 30.1 01/04/2015   No results found for: KPAFRELGTCHN, LAMBDASER, KAPLAMBRATIO No results found for: Kandis Cocking [facility] Lab Results    Component Value Date   TOTALPROTELP 6.3 09/22/2012   ALBUMINELP 59.8 09/22/2012   A1GS 6.4* 09/22/2012   A2GS 10.7 09/22/2012   BETS 5.7 09/22/2012   BETA2SER 4.6 09/22/2012   GAMS 12.8 09/22/2012   MSPIKE NOT DET 09/22/2012   SPEI * 09/22/2012     Chemistry      Component Value Date/Time   NA 136 03/10/2015 1100   NA 139 01/11/2015 1614   NA 142 12/20/2014 1129   K 3.4* 03/10/2015 1100   K 3.8 12/20/2014 1129   CL 98* 03/10/2015 1100   CL 97* 12/20/2014 1129   CO2 28 03/10/2015 1100   CO2 30 12/20/2014 1129   BUN 11 03/10/2015 1100   BUN 14 01/11/2015 1614   BUN 42* 12/20/2014 1129   CREATININE 0.93 03/10/2015 1100   CREATININE 1.43* 02/22/2015 1659      Component Value Date/Time   CALCIUM 10.0 03/10/2015 1100   CALCIUM 9.8 12/20/2014 1129   ALKPHOS 76 03/09/2015 0406   ALKPHOS 115* 12/20/2014 1129   AST 33 03/09/2015 0406   AST 28 12/20/2014 1129   ALT 43 03/09/2015 0406   ALT 38 12/20/2014 1129   BILITOT 0.6 03/09/2015 0406   BILITOT 0.8 01/11/2015 1614   BILITOT 0.60 12/20/2014 1129     Impression and Plan: Monica Riggs is a 81 year old white female with myelodysplasia. She was hospitalized last week for exacerbation of CHF and anemia. Her Hgb after 2 units of PRBC's is 9.3.  She is still weak and fatigued.   She is seen by home health 2 days a week and they will be rechecking her CBC in 2 weeks. They will notify us with results.  We will plan to see her back for follow-up and labs in 1 month.  Both she and her sister know to call here with any questions or concerns and to return to the ED in the event of an emergency. We can certainly see her sooner if need be.  Eliezer Bottom, NP 6/1/201610:38 AM

## 2015-03-17 DIAGNOSIS — I10 Essential (primary) hypertension: Secondary | ICD-10-CM | POA: Diagnosis not present

## 2015-03-17 DIAGNOSIS — E039 Hypothyroidism, unspecified: Secondary | ICD-10-CM | POA: Diagnosis not present

## 2015-03-17 DIAGNOSIS — M1990 Unspecified osteoarthritis, unspecified site: Secondary | ICD-10-CM | POA: Diagnosis not present

## 2015-03-17 DIAGNOSIS — D469 Myelodysplastic syndrome, unspecified: Secondary | ICD-10-CM | POA: Diagnosis not present

## 2015-03-17 DIAGNOSIS — E785 Hyperlipidemia, unspecified: Secondary | ICD-10-CM | POA: Diagnosis not present

## 2015-03-17 DIAGNOSIS — I482 Chronic atrial fibrillation: Secondary | ICD-10-CM | POA: Diagnosis not present

## 2015-03-22 DIAGNOSIS — D469 Myelodysplastic syndrome, unspecified: Secondary | ICD-10-CM | POA: Diagnosis not present

## 2015-03-22 DIAGNOSIS — I10 Essential (primary) hypertension: Secondary | ICD-10-CM | POA: Diagnosis not present

## 2015-03-22 DIAGNOSIS — I482 Chronic atrial fibrillation: Secondary | ICD-10-CM | POA: Diagnosis not present

## 2015-03-22 DIAGNOSIS — E785 Hyperlipidemia, unspecified: Secondary | ICD-10-CM | POA: Diagnosis not present

## 2015-03-22 DIAGNOSIS — E039 Hypothyroidism, unspecified: Secondary | ICD-10-CM | POA: Diagnosis not present

## 2015-03-22 DIAGNOSIS — M1990 Unspecified osteoarthritis, unspecified site: Secondary | ICD-10-CM | POA: Diagnosis not present

## 2015-03-24 DIAGNOSIS — I482 Chronic atrial fibrillation: Secondary | ICD-10-CM | POA: Diagnosis not present

## 2015-03-24 DIAGNOSIS — D469 Myelodysplastic syndrome, unspecified: Secondary | ICD-10-CM | POA: Diagnosis not present

## 2015-03-24 DIAGNOSIS — M1990 Unspecified osteoarthritis, unspecified site: Secondary | ICD-10-CM | POA: Diagnosis not present

## 2015-03-24 DIAGNOSIS — E039 Hypothyroidism, unspecified: Secondary | ICD-10-CM | POA: Diagnosis not present

## 2015-03-24 DIAGNOSIS — E785 Hyperlipidemia, unspecified: Secondary | ICD-10-CM | POA: Diagnosis not present

## 2015-03-24 DIAGNOSIS — I10 Essential (primary) hypertension: Secondary | ICD-10-CM | POA: Diagnosis not present

## 2015-03-25 ENCOUNTER — Other Ambulatory Visit: Payer: Self-pay | Admitting: *Deleted

## 2015-03-25 ENCOUNTER — Other Ambulatory Visit: Payer: Self-pay | Admitting: Family Medicine

## 2015-03-25 MED ORDER — OLMESARTAN MEDOXOMIL 40 MG PO TABS: ORAL_TABLET | ORAL | Status: AC

## 2015-03-30 DIAGNOSIS — D469 Myelodysplastic syndrome, unspecified: Secondary | ICD-10-CM | POA: Diagnosis not present

## 2015-03-30 DIAGNOSIS — I482 Chronic atrial fibrillation: Secondary | ICD-10-CM | POA: Diagnosis not present

## 2015-03-30 DIAGNOSIS — I10 Essential (primary) hypertension: Secondary | ICD-10-CM | POA: Diagnosis not present

## 2015-03-30 DIAGNOSIS — E785 Hyperlipidemia, unspecified: Secondary | ICD-10-CM | POA: Diagnosis not present

## 2015-03-30 DIAGNOSIS — M1990 Unspecified osteoarthritis, unspecified site: Secondary | ICD-10-CM | POA: Diagnosis not present

## 2015-03-30 DIAGNOSIS — E039 Hypothyroidism, unspecified: Secondary | ICD-10-CM | POA: Diagnosis not present

## 2015-04-08 ENCOUNTER — Telehealth: Payer: Self-pay

## 2015-04-08 DIAGNOSIS — D469 Myelodysplastic syndrome, unspecified: Secondary | ICD-10-CM | POA: Diagnosis not present

## 2015-04-08 DIAGNOSIS — I482 Chronic atrial fibrillation: Secondary | ICD-10-CM | POA: Diagnosis not present

## 2015-04-08 DIAGNOSIS — E785 Hyperlipidemia, unspecified: Secondary | ICD-10-CM | POA: Diagnosis not present

## 2015-04-08 DIAGNOSIS — I10 Essential (primary) hypertension: Secondary | ICD-10-CM | POA: Diagnosis not present

## 2015-04-08 DIAGNOSIS — E039 Hypothyroidism, unspecified: Secondary | ICD-10-CM | POA: Diagnosis not present

## 2015-04-08 DIAGNOSIS — M1990 Unspecified osteoarthritis, unspecified site: Secondary | ICD-10-CM | POA: Diagnosis not present

## 2015-04-08 NOTE — Telephone Encounter (Signed)
[facility] - aware and will contact pt

## 2015-04-08 NOTE — Telephone Encounter (Signed)
Leave off the Benicar today but continue to take HCTZ because of the edema which is most likely secondary to her anemia

## 2015-04-08 NOTE — Telephone Encounter (Signed)
Patients BP is 100/54   Told patient not to take her Benicar this morning but should she still take her HCTZ?  She has some edema

## 2015-04-13 ENCOUNTER — Other Ambulatory Visit (HOSPITAL_BASED_OUTPATIENT_CLINIC_OR_DEPARTMENT_OTHER): Payer: Medicare Other

## 2015-04-13 ENCOUNTER — Ambulatory Visit (HOSPITAL_COMMUNITY)
Admission: RE | Admit: 2015-04-13 | Discharge: 2015-04-13 | Disposition: A | Discharge status: Home / Self Care | Payer: Medicare Other | Source: Ambulatory Visit | Attending: Hematology & Oncology | Admitting: Hematology & Oncology

## 2015-04-13 ENCOUNTER — Telehealth: Payer: Self-pay | Admitting: *Deleted

## 2015-04-13 ENCOUNTER — Ambulatory Visit (HOSPITAL_BASED_OUTPATIENT_CLINIC_OR_DEPARTMENT_OTHER): Payer: Medicare Other | Admitting: Family

## 2015-04-13 ENCOUNTER — Encounter: Payer: Self-pay | Admitting: Family

## 2015-04-13 VITALS — BP 97/47 | HR 89 | Temp 97.6°F | Resp 18 | Ht 61.0 in | Wt 114.0 lb

## 2015-04-13 DIAGNOSIS — D464 Refractory anemia, unspecified: Secondary | ICD-10-CM | POA: Insufficient documentation

## 2015-04-13 DIAGNOSIS — Q069 Congenital malformation of spinal cord, unspecified: Secondary | ICD-10-CM | POA: Diagnosis not present

## 2015-04-13 DIAGNOSIS — IMO0001 Reserved for inherently not codable concepts without codable children: Secondary | ICD-10-CM

## 2015-04-13 DIAGNOSIS — D649 Anemia, unspecified: Secondary | ICD-10-CM

## 2015-04-13 DIAGNOSIS — D46Z Other myelodysplastic syndromes: Secondary | ICD-10-CM | POA: Insufficient documentation

## 2015-04-13 DIAGNOSIS — D469 Myelodysplastic syndrome, unspecified: Principal | ICD-10-CM

## 2015-04-13 LAB — CBC WITH DIFFERENTIAL (CANCER CENTER ONLY)
HEMATOCRIT: 18.2 % — AB (ref 34.8–46.6)
HEMOGLOBIN: 6.1 g/dL — AB (ref 11.6–15.9)
MCH: 31.4 pg (ref 26.0–34.0)
MCHC: 33.5 g/dL (ref 32.0–36.0)
MCV: 94 fL (ref 81–101)
Platelets: 172 10*3/uL (ref 145–400)
RBC: 1.94 10*6/uL — ABNORMAL LOW (ref 3.70–5.32)
RDW: 21.3 % — ABNORMAL HIGH (ref 11.1–15.7)
WBC: 3.1 10*3/uL — ABNORMAL LOW (ref 3.9–10.0)

## 2015-04-13 LAB — MANUAL DIFFERENTIAL (CHCC SATELLITE)
ALC: 1.3 10*3/uL (ref 0.6–2.2)
ANC (CHCC MAN DIFF): 1.4 10*3/uL — AB (ref 1.5–6.7)
Band Neutrophils: 6 % (ref 0–10)
Eos: 7 % (ref 0–7)
LYMPH: 42 % (ref 14–48)
MONO: 5 % (ref 0–13)
NRBC: 1 % — AB (ref 0–0)
PLATELET MORPHOLOGY: NORMAL
PLT EST ~~LOC~~: ADEQUATE
SEG: 40 % (ref 40–75)

## 2015-04-13 LAB — CMP (CANCER CENTER ONLY)
ALK PHOS: 86 U/L — AB (ref 26–84)
ALT(SGPT): 27 U/L (ref 10–47)
AST: 24 U/L (ref 11–38)
Albumin: 2.9 g/dL — ABNORMAL LOW (ref 3.3–5.5)
BILIRUBIN TOTAL: 0.9 mg/dL (ref 0.20–1.60)
BUN, Bld: 18 mg/dL (ref 7–22)
CO2: 28 meq/L (ref 18–33)
Calcium: 9.7 mg/dL (ref 8.0–10.3)
Chloride: 98 mEq/L (ref 98–108)
Creat: 1 mg/dl (ref 0.6–1.2)
GLUCOSE: 140 mg/dL — AB (ref 73–118)
Potassium: 3.6 mEq/L (ref 3.3–4.7)
Sodium: 136 mEq/L (ref 128–145)
Total Protein: 6.1 g/dL — ABNORMAL LOW (ref 6.4–8.1)

## 2015-04-13 LAB — HOLD TUBE, BLOOD BANK - CHCC SATELLITE

## 2015-04-13 NOTE — Telephone Encounter (Signed)
Critical Value HGB 6.1 Laverna Peace NP notified and orders placed.

## 2015-04-13 NOTE — Progress Notes (Signed)
Hematology and Oncology Follow Up Visit  Monica Riggs [id number] Dec 05, 1918 81 y.o. 04/13/2015   Principle Diagnosis:  Refractory anemia Myelodysplasia   Current Therapy:   Supportive care with transfusions    Interim History: Monica Riggs is here today with her sister for labs and follow-up. She does not have a lot of energy and is sleeping more.  She is not eating well or drinking much fluid. She was drinking a boost for breakfast each day but she has not been doing that much either lately. Her weight is actually [place] 5 lbs since her last visit.  Her sister states that she is becoming more confused and forgetful.  She did scrape her right shin while trying to get in a camper last week and has this area bandaged. Home health has been monitoring this for her. It appears to be healing nicely.  She denies fever, chills, n/v, cough, rash, headache, dizziness, chest pain, palpitations, abdominal pain, diarrhea, blood in urine or stool. She uses Mirilax to help with her constipation.  No tenderness, numbness or tingling in her extremities. No new aches or pains.  Home health is checking in her on tuesdays and thursdays. She will be going to the beach with her family next week.   Medications:    Medication List       This list is accurate as of: 04/13/15  8:51 AM.  Always use your most recent med list.               aspirin EC 81 MG tablet  Take 81 mg by mouth daily.     brimonidine 0.2 % ophthalmic solution  Commonly known as:  ALPHAGAN  Place 1 drop into both eyes 2 (two) times daily.     cholecalciferol 1000 UNITS tablet  Commonly known as:  VITAMIN D  Take 1,000 Units by mouth daily.     diltiazem 240 MG 24 hr capsule  Commonly known as:  CARDIZEM CD  TAKE ONE (1) CAPSULE EACH DAY     Fish Oil 1000 MG Caps  Take 1 capsule by mouth daily.     folic acid 256 MCG tablet  Commonly known as:  FOLVITE  Take 400 mcg by mouth 2 (two) times daily.     hydrochlorothiazide  25 MG tablet  Commonly known as:  HYDRODIURIL  TAKE 1 TABLET BY MOUTH DAILY     levothyroxine 50 MCG tablet  Commonly known as:  SYNTHROID, LEVOTHROID  TAKE ONE (1) TABLET EACH DAY     naproxen sodium 220 MG tablet  Commonly known as:  ANAPROX  Take 220 mg by mouth daily as needed. For pain     olmesartan 40 MG tablet  Commonly known as:  BENICAR  TAKE ONE (1) TABLET EACH DAY     polyethylene glycol packet  Commonly known as:  MIRALAX / GLYCOLAX  Take 17 g by mouth daily as needed for mild constipation.        Allergies:  Allergies  Allergen Reactions  . Sulfa Antibiotics Rash    Past Medical History, Surgical history, Social history, and Family History were reviewed and updated.  Review of Systems: All other 10 point review of systems is negative.   Physical Exam:  height is 5\' 1"  (1.549 m) and weight is 114 lb (51.71 kg). Her oral temperature is 97.6 F (36.4 C). Her blood pressure is 97/47 and her pulse is 89. Her respiration is 18.   Wt Readings from Last 3 Encounters:  04/13/15 114 lb (51.71 kg)  03/16/15 109 lb (49.442 kg)  03/10/15 110 lb 0.2 oz (49.9 kg)   Ocular: Sclerae unicteric, pupils equal, round and reactive to light Ear-nose-throat: Oropharynx clear, dentition fair Lymphatic: No cervical or supraclavicular adenopathy Lungs no rales or rhonchi, good excursion bilaterally Heart regular rate and rhythm, no murmur appreciated Abd soft, nontender, positive bowel sounds MSK no focal spinal tenderness, no joint edema Neuro: non-focal, well-oriented, appropriate affect Breasts: Deferred  Lab Results  Component Value Date   WBC 3.6* 03/16/2015   HGB 9.3* 03/16/2015   HCT 27.6* 03/16/2015   MCV 89 03/16/2015   PLT 131* 03/16/2015   Lab Results  Component Value Date   FERRITIN 3,053* 01/04/2015   IRON 169* 01/04/2015   TIBC 161* 01/04/2015   UIBC <1.0 01/04/2015   IRONPCTSAT >100 01/04/2015   Lab Results  Component Value Date   RETICCTPCT  1.6 01/04/2015   RBC 3.09* 03/16/2015   RETICCTABS 30.1 01/04/2015   No results found for: KPAFRELGTCHN, LAMBDASER, KAPLAMBRATIO No results found for: Kandis Cocking [facility] Lab Results  Component Value Date   TOTALPROTELP 6.3 09/22/2012   ALBUMINELP 59.8 09/22/2012   A1GS 6.4* 09/22/2012   A2GS 10.7 09/22/2012   BETS 5.7 09/22/2012   BETA2SER 4.6 09/22/2012   GAMS 12.8 09/22/2012   MSPIKE NOT DET 09/22/2012   SPEI * 09/22/2012     Chemistry      Component Value Date/Time   NA 131 03/16/2015 1021   NA 136 03/10/2015 1100   NA 139 01/11/2015 1614   K 3.2* 03/16/2015 1021   K 3.4* 03/10/2015 1100   CL 92* 03/16/2015 1021   CL 98* 03/10/2015 1100   CO2 30 03/16/2015 1021   CO2 28 03/10/2015 1100   BUN 17 03/16/2015 1021   BUN 11 03/10/2015 1100   BUN 14 01/11/2015 1614   CREATININE 1.0 03/16/2015 1021   CREATININE 0.93 03/10/2015 1100      Component Value Date/Time   CALCIUM 9.5 03/16/2015 1021   CALCIUM 10.0 03/10/2015 1100   ALKPHOS 80 03/16/2015 1021   ALKPHOS 76 03/09/2015 0406   AST 32 03/16/2015 1021   AST 33 03/09/2015 0406   ALT 46 03/16/2015 1021   ALT 43 03/09/2015 0406   BILITOT 1.00 03/16/2015 1021   BILITOT 0.6 03/09/2015 0406   BILITOT 0.8 01/11/2015 1614     Impression and Plan: Monica Riggs is a 81 year old white female with myelodysplasia. She is feeling fatigued and not eating well.  Her Hgb is down to 6.1 so we will transfuse her with 2 units of PRBC's tomorrow morning.  Her sister is going to try and get her to drink more Boost. Thankfully she is maintaining her weight.  We will plan to see her back for follow-up and labs in 1 month.  Both she and her sister know to call here with any questions or concerns. We can certainly see her sooner if need be.  Eliezer Bottom, NP 6/29/20168:51 AM

## 2015-04-14 ENCOUNTER — Ambulatory Visit (HOSPITAL_BASED_OUTPATIENT_CLINIC_OR_DEPARTMENT_OTHER): Payer: Medicare Other

## 2015-04-14 VITALS — BP 109/75 | HR 85 | Temp 98.2°F | Resp 18

## 2015-04-14 DIAGNOSIS — D469 Myelodysplastic syndrome, unspecified: Secondary | ICD-10-CM

## 2015-04-14 DIAGNOSIS — D464 Refractory anemia, unspecified: Secondary | ICD-10-CM | POA: Diagnosis not present

## 2015-04-14 DIAGNOSIS — D46Z Other myelodysplastic syndromes: Secondary | ICD-10-CM | POA: Diagnosis not present

## 2015-04-14 LAB — PREPARE RBC (CROSSMATCH)

## 2015-04-14 MED ORDER — FUROSEMIDE 10 MG/ML IJ SOLN: 20.0000 mg | Freq: Once | INTRAMUSCULAR | Status: DC

## 2015-04-14 MED ORDER — DIPHENHYDRAMINE HCL 25 MG PO CAPS
ORAL_CAPSULE | ORAL | Status: AC
  Filled 2015-04-14: qty 1

## 2015-04-14 MED ORDER — SODIUM CHLORIDE 0.9 % IV SOLN
250.0000 mL | Freq: Once | INTRAVENOUS | Status: AC
  Administered 2015-04-14: 250 mL via INTRAVENOUS

## 2015-04-14 MED ORDER — DIPHENHYDRAMINE HCL 25 MG PO CAPS
25.0000 mg | ORAL_CAPSULE | Freq: Once | ORAL | Status: AC
  Administered 2015-04-14: 25 mg via ORAL

## 2015-04-14 NOTE — Patient Instructions (Signed)

## 2015-04-15 LAB — TYPE AND SCREEN
ABO/RH(D): O NEG
Antibody Screen: NEGATIVE
UNIT DIVISION: 0
Unit division: 0

## 2015-05-10 ENCOUNTER — Other Ambulatory Visit: Payer: Self-pay | Admitting: *Deleted

## 2015-05-10 ENCOUNTER — Telehealth: Payer: Self-pay | Admitting: *Deleted

## 2015-05-10 DIAGNOSIS — D464 Refractory anemia, unspecified: Secondary | ICD-10-CM

## 2015-05-10 NOTE — Telephone Encounter (Signed)
Granddaughter called office stating that patient appeared weak, and felt like she needed a blood transfusion. She stated that patient has an appointment here on Friday, but that Friday afternoon they leave for [place] for a week and would be unable to get blood then. She would like appointment moved up. Currently there are no openings in Dr Antonieta Pert schedule. We will bring patient in for labs tomorrow and assess need for blood. For now, patient will remain on the Friday schedule for the doctor.

## 2015-05-11 ENCOUNTER — Telehealth: Payer: Self-pay | Admitting: *Deleted

## 2015-05-11 ENCOUNTER — Other Ambulatory Visit: Payer: Self-pay | Admitting: *Deleted

## 2015-05-11 ENCOUNTER — Ambulatory Visit (HOSPITAL_COMMUNITY)
Admission: RE | Admit: 2015-05-11 | Discharge: 2015-05-11 | Disposition: A | Discharge status: Home / Self Care | Payer: Medicare Other | Source: Ambulatory Visit | Attending: Hematology & Oncology | Admitting: Hematology & Oncology

## 2015-05-11 ENCOUNTER — Other Ambulatory Visit (HOSPITAL_BASED_OUTPATIENT_CLINIC_OR_DEPARTMENT_OTHER): Payer: Medicare Other

## 2015-05-11 DIAGNOSIS — D469 Myelodysplastic syndrome, unspecified: Secondary | ICD-10-CM

## 2015-05-11 DIAGNOSIS — D464 Refractory anemia, unspecified: Secondary | ICD-10-CM | POA: Diagnosis not present

## 2015-05-11 LAB — MANUAL DIFFERENTIAL (CHCC SATELLITE)
ALC: 1.1 10*3/uL (ref 0.6–2.2)
ANC (CHCC MAN DIFF): 1.4 10*3/uL — AB (ref 1.5–6.7)
BASO: 1 % (ref 0–2)
Band Neutrophils: 5 % (ref 0–10)
Eos: 7 % (ref 0–7)
LYMPH: 39 % (ref 14–48)
Metamyelocytes: 1 % — ABNORMAL HIGH (ref 0–0)
Myelocytes: 1 % — ABNORMAL HIGH (ref 0–0)
PLT EST ~~LOC~~: ADEQUATE
SEG: 41 % (ref 40–75)

## 2015-05-11 LAB — CBC WITH DIFFERENTIAL (CANCER CENTER ONLY)
HCT: 21.5 % — ABNORMAL LOW (ref 34.8–46.6)
HGB: 7.2 g/dL — ABNORMAL LOW (ref 11.6–15.9)
MCH: 31.9 pg (ref 26.0–34.0)
MCHC: 33.5 g/dL (ref 32.0–36.0)
MCV: 95 fL (ref 81–101)
Platelets: 167 10*3/uL (ref 145–400)
RBC: 2.26 10*6/uL — ABNORMAL LOW (ref 3.70–5.32)
RDW: 20.4 % — ABNORMAL HIGH (ref 11.1–15.7)
WBC: 2.8 10*3/uL — AB (ref 3.9–10.0)

## 2015-05-11 LAB — COMPREHENSIVE METABOLIC PANEL (CC13)
ALK PHOS: 94 U/L (ref 40–150)
ALT: 29 U/L (ref 0–55)
AST: 20 U/L (ref 5–34)
Albumin: 2.9 g/dL — ABNORMAL LOW (ref 3.5–5.0)
Anion Gap: 7 mEq/L (ref 3–11)
BILIRUBIN TOTAL: 0.58 mg/dL (ref 0.20–1.20)
BUN: 26.6 mg/dL — ABNORMAL HIGH (ref 7.0–26.0)
CO2: 29 meq/L (ref 22–29)
Calcium: 9.8 mg/dL (ref 8.4–10.4)
Chloride: 100 mEq/L (ref 98–109)
Creatinine: 1 mg/dL (ref 0.6–1.1)
EGFR: 48 mL/min/{1.73_m2} — ABNORMAL LOW (ref >90)
Glucose: 124 mg/dl (ref 70–140)
Potassium: 3.5 mEq/L (ref 3.5–5.1)
Sodium: 136 mEq/L (ref 136–145)
Total Protein: 6.2 g/dL — ABNORMAL LOW (ref 6.4–8.3)

## 2015-05-11 LAB — PREPARE RBC (CROSSMATCH)

## 2015-05-11 LAB — HOLD TUBE, BLOOD BANK - CHCC SATELLITE

## 2015-05-11 NOTE — Telephone Encounter (Signed)
Patient's HGB 7.2. Dr Marin Olp would like to transfuse. Granddaughter, Erasmo Downer, called and appointment made for patient to receive blood transfusion.

## 2015-05-12 ENCOUNTER — Ambulatory Visit (HOSPITAL_BASED_OUTPATIENT_CLINIC_OR_DEPARTMENT_OTHER): Payer: Medicare Other

## 2015-05-12 ENCOUNTER — Ambulatory Visit (HOSPITAL_BASED_OUTPATIENT_CLINIC_OR_DEPARTMENT_OTHER): Payer: Medicare Other | Admitting: Hematology & Oncology

## 2015-05-12 ENCOUNTER — Other Ambulatory Visit: Payer: Medicare Other

## 2015-05-12 ENCOUNTER — Ambulatory Visit: Payer: Medicare Other | Admitting: Family Medicine

## 2015-05-12 VITALS — BP 108/54 | HR 88 | Temp 98.2°F | Resp 18

## 2015-05-12 DIAGNOSIS — D469 Myelodysplastic syndrome, unspecified: Secondary | ICD-10-CM

## 2015-05-12 DIAGNOSIS — D464 Refractory anemia, unspecified: Secondary | ICD-10-CM

## 2015-05-12 MED ORDER — SODIUM CHLORIDE 0.9 % IV SOLN
250.0000 mL | Freq: Once | INTRAVENOUS | Status: AC
  Administered 2015-05-12: 250 mL via INTRAVENOUS

## 2015-05-12 MED ORDER — FUROSEMIDE 10 MG/ML IJ SOLN: 20.0000 mg | Freq: Once | INTRAMUSCULAR | Status: DC

## 2015-05-12 MED ORDER — DIPHENHYDRAMINE HCL 25 MG PO CAPS
ORAL_CAPSULE | ORAL | Status: AC
  Filled 2015-05-12: qty 1

## 2015-05-12 MED ORDER — ACETAMINOPHEN 325 MG PO TABS
650.0000 mg | ORAL_TABLET | Freq: Once | ORAL | Status: AC
  Administered 2015-05-12: 650 mg via ORAL

## 2015-05-12 MED ORDER — ACETAMINOPHEN 325 MG PO TABS
ORAL_TABLET | ORAL | Status: AC
  Filled 2015-05-12: qty 2

## 2015-05-12 MED ORDER — DIPHENHYDRAMINE HCL 25 MG PO CAPS
25.0000 mg | ORAL_CAPSULE | Freq: Once | ORAL | Status: AC
  Administered 2015-05-12: 25 mg via ORAL

## 2015-05-12 NOTE — Patient Instructions (Signed)

## 2015-05-12 NOTE — Progress Notes (Signed)
Hematology and Oncology Follow Up Visit  Monica Riggs [id number] 10-03-1919 81 y.o. 05/12/2015   Principle Diagnosis:  Refractory anemia  Current Therapy:   Supportive care with transfusions      Interim History:  Ms.  Riggs is back for followup. She got 2 units of blood today. She had blood work done today which show that her hemoglobin was 7.  Otherwise, she is doing okay. She now lives with her daughter. She is not happy about this.  Her appetite is actually better. She's not had any problems with nausea or vomiting.  There is no bleeding. She's had no cardiac issues with respect to heart failure.   Her overall, her performance status is ECOG 3.  Medications:  Current outpatient prescriptions:  .  aspirin EC 81 MG tablet, Take 81 mg by mouth daily., Disp: , Rfl:  .  brimonidine (ALPHAGAN) 0.2 % ophthalmic solution, Place 1 drop into both eyes 2 (two) times daily., Disp: , Rfl:  .  cholecalciferol (VITAMIN D) 1000 UNITS tablet, Take 1,000 Units by mouth daily., Disp: , Rfl:  .  diltiazem (CARDIZEM CD) 240 MG 24 hr capsule, TAKE ONE (1) CAPSULE EACH DAY, Disp: 30 capsule, Rfl: 1 .  folic acid (FOLVITE) 361 MCG tablet, Take 400 mcg by mouth 2 (two) times daily., Disp: , Rfl:  .  hydrochlorothiazide (HYDRODIURIL) 25 MG tablet, TAKE 1 TABLET BY MOUTH DAILY, Disp: 30 tablet, Rfl: 3 .  levothyroxine (SYNTHROID, LEVOTHROID) 50 MCG tablet, TAKE ONE (1) TABLET EACH DAY, Disp: 30 tablet, Rfl: 11 .  naproxen sodium (ANAPROX) 220 MG tablet, Take 220 mg by mouth daily as needed. For pain, Disp: , Rfl:  .  olmesartan (BENICAR) 40 MG tablet, TAKE ONE (1) TABLET EACH DAY, Disp: 30 tablet, Rfl: 2 .  Omega-3 Fatty Acids (FISH OIL) 1000 MG CAPS, Take 1 capsule by mouth daily., Disp: , Rfl:  .  polyethylene glycol (MIRALAX / GLYCOLAX) packet, Take 17 g by mouth daily as needed for mild constipation., Disp: , Rfl:  No current facility-administered medications for this  visit.  Facility-Administered Medications Ordered in Other Visits:  .  furosemide (LASIX) injection 20 mg, 20 mg, Intravenous, Once, Volanda Napoleon, MD  Allergies:  Allergies  Allergen Reactions  . Sulfa Antibiotics Rash    Past Medical History, Surgical history, Social history, and Family History were reviewed and updated.  Review of Systems: As above  Physical Exam:  vitals were not taken for this visit.  Elderly, petite white female. She is somewhat pale. Lungs are clear. Cardiac exam irregular rate and rhythm consistent with atrial fibrillation. . She has a 2/6 systolic ejection murmur. Abdomen is soft. She has good bowel sounds. There is no fluid wave is no palpable liver or spleen . Back exam shows no tenderness over the spine ribs or hips. There is some slight kyphosis. Extremities shows minimal edema in her lower legs. Skin exam shows no rashes or ecchymosis. Neurological exam is nonfocal. Her affect is a little bit flat  Lab Results  Component Value Date   WBC 2.8* 05/11/2015   HGB 7.2* 05/11/2015   HCT 21.5* 05/11/2015   MCV 95 05/11/2015   PLT 167 05/11/2015     Chemistry      Component Value Date/Time   NA 136 05/11/2015 1045   NA 136 04/13/2015 1055   NA 136 03/10/2015 1100   NA 139 01/11/2015 1614   K 3.5 05/11/2015 1045   K 3.6 04/13/2015 1055  K 3.4* 03/10/2015 1100   CL 98 04/13/2015 1055   CL 98* 03/10/2015 1100   CO2 29 05/11/2015 1045   CO2 28 04/13/2015 1055   CO2 28 03/10/2015 1100   BUN 26.6* 05/11/2015 1045   BUN 18 04/13/2015 1055   BUN 11 03/10/2015 1100   BUN 14 01/11/2015 1614   CREATININE 1.0 05/11/2015 1045   CREATININE 1.0 04/13/2015 1055   CREATININE 0.93 03/10/2015 1100      Component Value Date/Time   CALCIUM 9.8 05/11/2015 1045   CALCIUM 9.7 04/13/2015 1055   CALCIUM 10.0 03/10/2015 1100   ALKPHOS 94 05/11/2015 1045   ALKPHOS 86* 04/13/2015 1055   ALKPHOS 76 03/09/2015 0406   AST 20 05/11/2015 1045   AST 24 04/13/2015  1055   AST 33 03/09/2015 0406   ALT 29 05/11/2015 1045   ALT 27 04/13/2015 1055   ALT 43 03/09/2015 0406   BILITOT 0.58 05/11/2015 1045   BILITOT 0.90 04/13/2015 1055   BILITOT 0.6 03/09/2015 0406   BILITOT 0.8 01/11/2015 1614         Impression and Plan: Monica Riggs is a 81 year old white female. She  has myelodysplasia.  Again, she's been doing as well as one could expect. We went ahead and transfuse her today. I'm glad that we do this as she and her family are going down to [place] for her [employer] competition next week.   She is not happy about being with her daughter. She really wants to be in her own home. However, I does don't think that she is physically capable of being in her own home.  We will go ahead and plan to see her back in 3 weeks.   Volanda Napoleon, MD 7/28/20163:16 PM

## 2015-05-13 ENCOUNTER — Other Ambulatory Visit: Payer: Medicare Other

## 2015-05-13 ENCOUNTER — Ambulatory Visit: Payer: Medicare Other | Admitting: Hematology & Oncology

## 2015-05-13 LAB — TYPE AND SCREEN
ABO/RH(D): O NEG
Antibody Screen: NEGATIVE
Unit division: 0
Unit division: 0

## 2015-05-16 ENCOUNTER — Ambulatory Visit (HOSPITAL_COMMUNITY)
Admission: RE | Admit: 2015-05-16 | Discharge: 2015-05-16 | Disposition: A | Discharge status: Home / Self Care | Payer: Medicare Other | Source: Ambulatory Visit | Attending: Hematology & Oncology | Admitting: Hematology & Oncology

## 2015-05-22 ENCOUNTER — Other Ambulatory Visit: Payer: Self-pay | Admitting: Family Medicine

## 2015-05-25 ENCOUNTER — Ambulatory Visit: Payer: Medicare Other | Admitting: Family Medicine

## 2015-05-30 ENCOUNTER — Ambulatory Visit (HOSPITAL_BASED_OUTPATIENT_CLINIC_OR_DEPARTMENT_OTHER): Payer: Medicare Other | Admitting: Family

## 2015-05-30 ENCOUNTER — Encounter: Payer: Self-pay | Admitting: Family

## 2015-05-30 ENCOUNTER — Other Ambulatory Visit (HOSPITAL_BASED_OUTPATIENT_CLINIC_OR_DEPARTMENT_OTHER): Payer: Medicare Other

## 2015-05-30 VITALS — BP 96/48 | HR 82 | Temp 97.4°F | Resp 16 | Ht 61.0 in | Wt 108.0 lb

## 2015-05-30 DIAGNOSIS — D469 Myelodysplastic syndrome, unspecified: Secondary | ICD-10-CM

## 2015-05-30 DIAGNOSIS — D464 Refractory anemia, unspecified: Secondary | ICD-10-CM

## 2015-05-30 DIAGNOSIS — IMO0001 Reserved for inherently not codable concepts without codable children: Secondary | ICD-10-CM

## 2015-05-30 LAB — HOLD TUBE, BLOOD BANK - CHCC SATELLITE

## 2015-05-30 LAB — CMP (CANCER CENTER ONLY)
ALT: 35 U/L (ref 10–47)
AST: 29 U/L (ref 11–38)
Albumin: 3.1 g/dL — ABNORMAL LOW (ref 3.3–5.5)
Alkaline Phosphatase: 74 U/L (ref 26–84)
BUN: 17 mg/dL (ref 7–22)
CHLORIDE: 97 meq/L — AB (ref 98–108)
CO2: 29 mEq/L (ref 18–33)
Calcium: 9.9 mg/dL (ref 8.0–10.3)
Creat: 1 mg/dl (ref 0.6–1.2)
GLUCOSE: 142 mg/dL — AB (ref 73–118)
POTASSIUM: 4 meq/L (ref 3.3–4.7)
SODIUM: 135 meq/L (ref 128–145)
Total Bilirubin: 0.8 mg/dl (ref 0.20–1.60)
Total Protein: 6.3 g/dL — ABNORMAL LOW (ref 6.4–8.1)

## 2015-05-30 LAB — CBC WITH DIFFERENTIAL (CANCER CENTER ONLY)
HEMATOCRIT: 24.9 % — AB (ref 34.8–46.6)
HEMOGLOBIN: 8.4 g/dL — AB (ref 11.6–15.9)
MCH: 31.6 pg (ref 26.0–34.0)
MCHC: 33.7 g/dL (ref 32.0–36.0)
MCV: 94 fL (ref 81–101)
Platelets: 190 10*3/uL (ref 145–400)
RBC: 2.66 10*6/uL — ABNORMAL LOW (ref 3.70–5.32)
RDW: 17.9 % — ABNORMAL HIGH (ref 11.1–15.7)
WBC: 2.4 10*3/uL — AB (ref 3.9–10.0)

## 2015-05-30 LAB — MANUAL DIFFERENTIAL (CHCC SATELLITE)
ALC: 1 10*3/uL (ref 0.6–2.2)
ANC (CHCC HP manual diff): 0.9 10*3/uL — ABNORMAL LOW (ref 1.5–6.7)
BAND NEUTROPHILS: 4 % (ref 0–10)
Eos: 13 % — ABNORMAL HIGH (ref 0–7)
LYMPH: 44 % (ref 14–48)
MONO: 3 % (ref 0–13)
PLATELET MORPHOLOGY: NORMAL
PLT EST ~~LOC~~: ADEQUATE
SEG: 36 % — ABNORMAL LOW (ref 40–75)

## 2015-05-30 LAB — RETICULOCYTES (CHCC)
ABS RETIC: 30.8 10*3/uL (ref 19.0–186.0)
RBC.: 2.8 MIL/uL — ABNORMAL LOW (ref 3.87–5.11)
RETIC CT PCT: 1.1 % (ref 0.4–2.3)

## 2015-05-30 NOTE — Progress Notes (Signed)
Hematology and Oncology Follow Up Visit  BERIT RACZKOWSKI [id number] [birth year] 81 y.o. 05/30/2015   Principle Diagnosis:  Refractory anemia Myelodysplasia   Current Therapy:   Supportive care with transfusions    Interim History: Ms. Amparo is here today with her daughter for labs and follow-up. She had a few weeks ago before going on a trip to [place] and her Hgb is still holding at 8.4. She is tired and is still sleeping a lot.  Her daughter states that she is not eating or drinking much. Her weight is down 6 lbs today.  She does become pleasantly confused at times.  She is using a cane to ambulate and thankfully has not had any falls.  She denies fever, chills, n/v, cough, rash, headache, dizziness, chest pain, palpitations, abdominal pain, diarrhea, blood in urine or stool. He constipation is resolved with Miralax.  No tenderness, numbness or tingling in her extremities. No new aches or pains.   Medications:    Medication List       This list is accurate as of: 05/30/15 11:14 AM.  Always use your most recent med list.               aspirin EC 81 MG tablet  Take 81 mg by mouth daily.     brimonidine 0.2 % ophthalmic solution  Commonly known as:  ALPHAGAN  Place 1 drop into both eyes 2 (two) times daily.     cholecalciferol 1000 UNITS tablet  Commonly known as:  VITAMIN D  Take 1,000 Units by mouth daily.     diltiazem 240 MG 24 hr capsule  Commonly known as:  CARDIZEM CD  TAKE ONE (1) CAPSULE EACH DAY     Fish Oil 1000 MG Caps  Take 1 capsule by mouth daily.     folic acid 852 MCG tablet  Commonly known as:  FOLVITE  Take 400 mcg by mouth 2 (two) times daily.     hydrochlorothiazide 25 MG tablet  Commonly known as:  HYDRODIURIL  TAKE 1 TABLET BY MOUTH DAILY     levothyroxine 50 MCG tablet  Commonly known as:  SYNTHROID, LEVOTHROID  TAKE ONE (1) TABLET EACH DAY     naproxen sodium 220 MG tablet  Commonly known as:  ANAPROX  Take 220 mg by mouth daily as  needed. For pain     olmesartan 40 MG tablet  Commonly known as:  BENICAR  TAKE ONE (1) TABLET EACH DAY     polyethylene glycol packet  Commonly known as:  MIRALAX / GLYCOLAX  Take 17 g by mouth daily as needed for mild constipation.        Allergies:  Allergies  Allergen Reactions  . Sulfa Antibiotics Rash    Past Medical History, Surgical history, Social history, and Family History were reviewed and updated.  Review of Systems: All other 10 point review of systems is negative.   Physical Exam:  height is 5\' 1"  (1.549 m) and weight is 108 lb (48.988 kg). Her oral temperature is 97.4 F (36.3 C). Her blood pressure is 96/48 and her pulse is 82. Her respiration is 16.   Wt Readings from Last 3 Encounters:  05/30/15 108 lb (48.988 kg)  04/13/15 114 lb (51.71 kg)  03/16/15 109 lb (49.442 kg)   Ocular: Sclerae unicteric, pupils equal, round and reactive to light Ear-nose-throat: Oropharynx clear, dentition fair Lymphatic: No cervical or supraclavicular adenopathy Lungs no rales or rhonchi, good excursion bilaterally Heart regular rate and  rhythm, no murmur appreciated Abd soft, nontender, positive bowel sounds MSK no focal spinal tenderness, no joint edema Neuro: non-focal, well-oriented, appropriate affect Breasts: Deferred  Lab Results  Component Value Date   WBC 2.8* 05/11/2015   HGB 7.2* 05/11/2015   HCT 21.5* 05/11/2015   MCV 95 05/11/2015   PLT 167 05/11/2015   Lab Results  Component Value Date   FERRITIN 3,053* 01/04/2015   IRON 169* 01/04/2015   TIBC 161* 01/04/2015   UIBC <1.0 01/04/2015   IRONPCTSAT >100 01/04/2015   Lab Results  Component Value Date   RETICCTPCT 1.6 01/04/2015   RBC 2.26* 05/11/2015   RETICCTABS 30.1 01/04/2015   No results found for: KPAFRELGTCHN, LAMBDASER, KAPLAMBRATIO No results found for: Kandis Cocking, [facility] Lab Results  Component Value Date   TOTALPROTELP 6.3 09/22/2012   ALBUMINELP 59.8 09/22/2012   A1GS 6.4*  09/22/2012   A2GS 10.7 09/22/2012   BETS 5.7 09/22/2012   BETA2SER 4.6 09/22/2012   GAMS 12.8 09/22/2012   MSPIKE NOT DET 09/22/2012   SPEI * 09/22/2012     Chemistry      Component Value Date/Time   NA 136 05/11/2015 1045   NA 136 04/13/2015 1055   NA 136 03/10/2015 1100   NA 139 01/11/2015 1614   K 3.5 05/11/2015 1045   K 3.6 04/13/2015 1055   K 3.4* 03/10/2015 1100   CL 98 04/13/2015 1055   CL 98* 03/10/2015 1100   CO2 29 05/11/2015 1045   CO2 28 04/13/2015 1055   CO2 28 03/10/2015 1100   BUN 26.6* 05/11/2015 1045   BUN 18 04/13/2015 1055   BUN 11 03/10/2015 1100   BUN 14 01/11/2015 1614   CREATININE 1.0 05/11/2015 1045   CREATININE 1.0 04/13/2015 1055   CREATININE 0.93 03/10/2015 1100      Component Value Date/Time   CALCIUM 9.8 05/11/2015 1045   CALCIUM 9.7 04/13/2015 1055   CALCIUM 10.0 03/10/2015 1100   ALKPHOS 94 05/11/2015 1045   ALKPHOS 86* 04/13/2015 1055   ALKPHOS 76 03/09/2015 0406   AST 20 05/11/2015 1045   AST 24 04/13/2015 1055   AST 33 03/09/2015 0406   ALT 29 05/11/2015 1045   ALT 27 04/13/2015 1055   ALT 43 03/09/2015 0406   BILITOT 0.58 05/11/2015 1045   BILITOT 0.90 04/13/2015 1055   BILITOT 0.6 03/09/2015 0406   BILITOT 0.8 01/11/2015 1614     Impression and Plan: Ms. Savich is a 81 year old white female with myelodysplasia. She is still tired and sleeps a good deal. She is not eating well and her weight is down 6 lbs today.  She received blood a few weeks ago and today her Hgb is 8.4. We will sit tight for now.  We will plan to see her back in 3 weeks and go ahead and schedule her for a blood transfusion that day depending on lab results.  Both she and her daughter know to call here with any questions or concerns. We can certainly see her sooner if need be.  Eliezer Bottom, NP 8/15/201611:14 AM

## 2015-06-16 ENCOUNTER — Ambulatory Visit (HOSPITAL_COMMUNITY)
Admission: RE | Admit: 2015-06-16 | Discharge: 2015-06-16 | Disposition: A | Discharge status: Home / Self Care | Payer: Medicare Other | Source: Ambulatory Visit | Attending: Hematology & Oncology | Admitting: Hematology & Oncology

## 2015-06-16 DIAGNOSIS — D649 Anemia, unspecified: Secondary | ICD-10-CM | POA: Insufficient documentation

## 2015-06-20 ENCOUNTER — Emergency Department (HOSPITAL_BASED_OUTPATIENT_CLINIC_OR_DEPARTMENT_OTHER): Payer: Medicare Other

## 2015-06-20 ENCOUNTER — Encounter (HOSPITAL_BASED_OUTPATIENT_CLINIC_OR_DEPARTMENT_OTHER): Payer: Self-pay

## 2015-06-20 ENCOUNTER — Emergency Department (HOSPITAL_BASED_OUTPATIENT_CLINIC_OR_DEPARTMENT_OTHER)
Admission: EM | Admit: 2015-06-20 | Discharge: 2015-06-20 | Disposition: A | Discharge status: Home / Self Care | Payer: Medicare Other | Attending: Emergency Medicine | Admitting: Emergency Medicine

## 2015-06-20 DIAGNOSIS — Z8639 Personal history of other endocrine, nutritional and metabolic disease: Secondary | ICD-10-CM | POA: Diagnosis not present

## 2015-06-20 DIAGNOSIS — K047 Periapical abscess without sinus: Secondary | ICD-10-CM | POA: Diagnosis not present

## 2015-06-20 DIAGNOSIS — I10 Essential (primary) hypertension: Secondary | ICD-10-CM | POA: Insufficient documentation

## 2015-06-20 DIAGNOSIS — M1990 Unspecified osteoarthritis, unspecified site: Secondary | ICD-10-CM | POA: Insufficient documentation

## 2015-06-20 DIAGNOSIS — Z7982 Long term (current) use of aspirin: Secondary | ICD-10-CM | POA: Insufficient documentation

## 2015-06-20 DIAGNOSIS — D6182 Myelophthisis: Secondary | ICD-10-CM | POA: Insufficient documentation

## 2015-06-20 DIAGNOSIS — Z79899 Other long term (current) drug therapy: Secondary | ICD-10-CM | POA: Diagnosis not present

## 2015-06-20 DIAGNOSIS — R220 Localized swelling, mass and lump, head: Secondary | ICD-10-CM | POA: Diagnosis not present

## 2015-06-20 DIAGNOSIS — Z87828 Personal history of other (healed) physical injury and trauma: Secondary | ICD-10-CM | POA: Insufficient documentation

## 2015-06-20 DIAGNOSIS — R05 Cough: Secondary | ICD-10-CM | POA: Diagnosis not present

## 2015-06-20 DIAGNOSIS — Q069 Congenital malformation of spinal cord, unspecified: Secondary | ICD-10-CM | POA: Insufficient documentation

## 2015-06-20 DIAGNOSIS — I4891 Unspecified atrial fibrillation: Secondary | ICD-10-CM | POA: Insufficient documentation

## 2015-06-20 HISTORY — DX: Encounter for other specified aftercare: Z51.89

## 2015-06-20 LAB — CBC WITH DIFFERENTIAL/PLATELET
BASOS ABS: 0 10*3/uL (ref 0.0–0.1)
Basophils Relative: 0 % (ref 0–1)
EOS PCT: 0 % (ref 0–5)
Eosinophils Absolute: 0 10*3/uL (ref 0.0–0.7)
HEMATOCRIT: 19.3 % — AB (ref 36.0–46.0)
HEMOGLOBIN: 6.1 g/dL — AB (ref 12.0–15.0)
LYMPHS PCT: 32 % (ref 12–46)
Lymphs Abs: 0.6 10*3/uL — ABNORMAL LOW (ref 0.7–4.0)
MCH: 30 pg (ref 26.0–34.0)
MCHC: 31.6 g/dL (ref 30.0–36.0)
MCV: 95.1 fL (ref 78.0–100.0)
MONOS PCT: 6 % (ref 3–12)
Monocytes Absolute: 0.1 10*3/uL (ref 0.1–1.0)
NEUTROS PCT: 62 % (ref 43–77)
Neutro Abs: 1.3 10*3/uL — ABNORMAL LOW (ref 1.7–7.7)
Platelets: 169 10*3/uL (ref 150–400)
RBC: 2.03 MIL/uL — AB (ref 3.87–5.11)
RDW: 21.3 % — ABNORMAL HIGH (ref 11.5–15.5)
WBC: 2 10*3/uL — AB (ref 4.0–10.5)

## 2015-06-20 LAB — BASIC METABOLIC PANEL
ANION GAP: 8 (ref 5–15)
BUN: 21 mg/dL — ABNORMAL HIGH (ref 6–20)
CHLORIDE: 97 mmol/L — AB (ref 101–111)
CO2: 28 mmol/L (ref 22–32)
Calcium: 9.7 mg/dL (ref 8.9–10.3)
Creatinine, Ser: 1.07 mg/dL — ABNORMAL HIGH (ref 0.44–1.00)
GFR, EST AFRICAN AMERICAN: 50 mL/min — AB (ref >60)
GFR, EST NON AFRICAN AMERICAN: 43 mL/min — AB (ref >60)
Glucose, Bld: 119 mg/dL — ABNORMAL HIGH (ref 65–99)
POTASSIUM: 3.4 mmol/L — AB (ref 3.5–5.1)
SODIUM: 133 mmol/L — AB (ref 135–145)

## 2015-06-20 MED ORDER — IOHEXOL 300 MG/ML  SOLN
75.0000 mL | Freq: Once | INTRAMUSCULAR | Status: AC | PRN
  Administered 2015-06-20: 75 mL via INTRAVENOUS

## 2015-06-20 MED ORDER — CIPROFLOXACIN 500 MG/5ML (10%) PO SUSR: 250.0000 mg | Freq: Two times a day (BID) | ORAL | Status: AC

## 2015-06-20 MED ORDER — CLINDAMYCIN PHOSPHATE 600 MG/50ML IV SOLN
600.0000 mg | Freq: Once | INTRAVENOUS | Status: AC
  Administered 2015-06-20: 600 mg via INTRAVENOUS
  Filled 2015-06-20: qty 50

## 2015-06-20 MED ORDER — CIPROFLOXACIN HCL 250 MG PO TABS: 250.0000 mg | ORAL_TABLET | Freq: Two times a day (BID) | ORAL | Status: DC

## 2015-06-20 NOTE — ED Notes (Signed)
Rx x 1 given for cipro. Family at bedside to take pt home. Advised to call oral surgeon in the morning to set up follow up appt

## 2015-06-20 NOTE — ED Notes (Signed)
Resident MD at bedside.

## 2015-06-20 NOTE — ED Notes (Signed)
Prescription called to [facility] @ [phone] for Cipro tablets 250mg  two times a day, dispense 20 tablets with no refills.

## 2015-06-20 NOTE — ED Notes (Signed)
Delo MD at bedside.

## 2015-06-20 NOTE — ED Notes (Addendum)
Cough, runny nose, right ear ache x 2-3 days-woke with swelling to right side of face x this am-pt states "a pon"

## 2015-06-20 NOTE — ED Notes (Signed)
Patient transported to CT 

## 2015-06-20 NOTE — ED Notes (Signed)
Pt placed on cardiac monitor 

## 2015-06-20 NOTE — ED Notes (Signed)
Patient transported to X-ray 

## 2015-06-20 NOTE — ED Provider Notes (Signed)
CSN: [id number]     Arrival date & time 06/20/15  1203 History   First MD Initiated Contact with Patient 06/20/15 1337     Chief Complaint  Patient presents with  . Facial Swelling    HPI  80 year old with myelodysplasia undergoing transfusions with an ANC of 900, afib not on anticoagulation presents with 1 day history of swelling of her right jaw that is a little sore. She also says she's been having pain behind her right ear and has had a dry cough for the past week. Her daughter gave her some mucinex 2 days ago for her cough. She denies vertigo, fever, hearing loss, sore throat, and has otherwise been feeling well.   Past Medical History  Diagnosis Date  . Hyperlipidemia   . Hypertension   . Myelodysplasia   . Arthritis   . Chronic atrial fibrillation   . Left knee injury    Past Surgical History  Procedure Laterality Date  . Abdominal hysterectomy    . Tonsillectomy     Family History  Problem Relation Age of Onset  . Stroke      Parents  . Breast cancer      Female members on the maternal side  . Breast cancer      Father   Social History  Substance Use Topics  . Smoking status: Never Smoker   . Smokeless tobacco: Never Used     Comment: never used tobacco  . Alcohol Use: No   OB History    No data available     Review of Systems  Constitutional: Negative for fever, chills and appetite change.  HENT: Positive for facial swelling. Negative for congestion, dental problem, ear discharge, ear pain, hearing loss, mouth sores, rhinorrhea, sinus pressure, sore throat, trouble swallowing and voice change.   Eyes: Negative for photophobia and pain.  Respiratory: Negative for chest tightness, shortness of breath and wheezing.   Cardiovascular: Negative for chest pain and leg swelling.  Gastrointestinal: Negative for nausea, vomiting, abdominal pain, diarrhea and constipation.  Skin: Negative for rash.  Neurological: Negative for dizziness, facial asymmetry, speech  difficulty, light-headedness and headaches.   Allergies  Sulfa antibiotics  Home Medications   Prior to Admission medications   Medication Sig Start Date End Date Taking? Authorizing Provider  Dextromethorphan-Guaifenesin [facility] DM PO) Take by mouth.   Yes Historical Provider, MD  aspirin EC 81 MG tablet Take 81 mg by mouth daily.    Historical Provider, MD  brimonidine (ALPHAGAN) 0.2 % ophthalmic solution Place 1 drop into both eyes 2 (two) times daily.    Historical Provider, MD  cholecalciferol (VITAMIN D) 1000 UNITS tablet Take 1,000 Units by mouth daily.    Historical Provider, MD  diltiazem (CARDIZEM CD) 240 MG 24 hr capsule TAKE ONE (1) CAPSULE EACH DAY 11/19/14   Theodis Blaze, MD  folic acid (FOLVITE) 440 MCG tablet Take 400 mcg by mouth 2 (two) times daily.    Historical Provider, MD  hydrochlorothiazide (HYDRODIURIL) 25 MG tablet TAKE 1 TABLET BY MOUTH DAILY 03/25/15   [name], MD  levothyroxine (SYNTHROID, LEVOTHROID) 50 MCG tablet TAKE ONE (1) TABLET EACH DAY 06/11/14   [name], MD  naproxen sodium (ANAPROX) 220 MG tablet Take 220 mg by mouth daily as needed. For pain    Historical Provider, MD  olmesartan (BENICAR) 40 MG tablet TAKE ONE (1) TABLET EACH DAY 03/25/15   [name], MD  Omega-3 Fatty Acids ([place])  1000 MG CAPS Take 1 capsule by mouth daily.    Historical Provider, MD  polyethylene glycol (MIRALAX / GLYCOLAX) packet Take 17 g by mouth daily as needed for mild constipation.    Historical Provider, MD   BP 98/49 mmHg  Pulse 112  Temp(Src) 98.3 F (36.8 C) (Oral)  Resp 18  Ht 5\' 1"  (1.549 m)  Wt 112 lb (50.803 kg)  BMI 21.17 kg/m2  SpO2 96%   Physical Exam  Constitutional: She appears well-developed and well-nourished.  Quite pail and frail  HENT:  Head: Normocephalic and atraumatic.  Mouth/Throat: Oropharynx is clear and moist.  Just over the right mandible there is a mobile, non-fluctuant, non-erythematous mass with edema extending to  her right lower lip. Mastoid non-tender to palpation. Her dentition looks healthy and normal. No lymphadenopathy.  Eyes: Conjunctivae and EOM are normal. Right eye exhibits no discharge.  Neck: Normal range of motion. Neck supple. No JVD present.  Cardiovascular: Normal rate.   No murmur heard. Irregular rhythm  Pulmonary/Chest: Effort normal and breath sounds normal.  Abdominal: Soft. Bowel sounds are normal.  Musculoskeletal: Normal range of motion.  Lymphadenopathy:    She has no cervical adenopathy.  Neurological: No cranial nerve deficit.  Skin: Skin is warm and dry.  Psychiatric: She has a normal mood and affect. Her behavior is normal.    ED Course  Procedures (including critical care time) Labs Review Labs Reviewed - No data to display  Imaging Review No results found. I have personally reviewed and evaluated these images and lab results as part of my medical decision-making.   EKG Interpretation None       MDM   Final diagnoses:  Myelophthisic anemia  Dental abscess   81 yo lady with history of myelodysplasia presented with painful bump on her jaw, lightheadedness, and dry cough, found to have a dental abscess on head CT, hemoglobin of 6.1, and right sided pleural effusion. Although we thought it best she be admitted, her family was very against thisbecause she became quite delirious during her last admission and took a week to recover. I explained the risks of her leaving with a hemoglobin of 6, including fall, myocardial ischemia, as well potential worsening of her dental abscess. They understood and acknowledged these risks and were okay with the plan to be discharged home with oral ciprofloxacin for her abscess. She will be getting a transfusion on Thursday with her oncologist. I recommended her to follow up with her PCP later this week for to re-evaluate her dental abscess given her neutropenia.   Loleta Chance, MD 06/20/15 [place], MD 06/20/15  [place], MD 06/21/15 2133

## 2015-06-20 NOTE — Discharge Instructions (Signed)
Abscessed Tooth An abscessed tooth is an infection around your tooth. It may be caused by holes or damage to the tooth (cavity) or a dental disease. An abscessed tooth causes mild to very bad pain in and around the tooth. See your dentist right away if you have tooth or gum pain. HOME CARE  Take your medicine as told. Finish it even if you start to feel better.  Do not drive after taking pain medicine.  Rinse your mouth (gargle) often with salt water ( teaspoon salt in 8 ounces of warm water).  Do not apply heat to the outside of your face. GET HELP RIGHT AWAY IF:   You have a temperature by mouth above 102 F (38.9 C), not controlled by medicine.  You have chills and a very bad headache.  You have problems breathing or swallowing.  Your mouth will not open.  You develop puffiness (swelling) on the neck or around the eye.  Your pain is not helped by medicine.  Your pain is getting worse instead of better. MAKE SURE YOU:   Understand these instructions.  Will watch your condition.  Will get help right away if you are not doing well or get worse. Document Released: 03/19/2008 Document Revised: 12/24/2011 Document Reviewed: 01/09/2011 [facility] Patient Information 2015 [place], [place]. This information is not intended to replace advice given to you by your health care provider. Make sure you discuss any questions you have with your health care provider.  Aplastic Anemia Aplastic anemia occurs when the soft material that makes up the hollow insides of your bones (bone marrow) stops making enough blood cells. There are three types of blood cells:   Red blood cells. These carry oxygen to the tissues of the body.   White blood cells. These fight infection.   Platelets. These help your blood clot when you have an injury.  Aplastic anemia is a rare and serious condition that may develop slowly or rapidly. Even after successful treatment, those with aplastic anemia must be  monitored for possible recurrent problems.  CAUSES  Anything that hurts or injures your bone marrow can cause aplastic anemia. Things that may injure marrow include:   Radiation and chemotherapy treatment for cancer. These treatments are used to kill cancer cells but also damage other cells.   Exposure to toxic chemicals used in some pesticides and insecticides.   Some medicines, such as those used to treat rheumatoid arthritis.   Autoimmune disorders in which your immune system begins attacking your own body cells.   Viral infections, including hepatitis.   Pregnancy.  Sometimes the cause is not known.  SYMPTOMS   Shortness of breath.   Fatigue.   Lightheadedness or fainting.   Shortness of breath and rapid heart rate, especially with exertion.   Pale skin and lips.   Frequent infections.   Easy bruising and bleeding.   Nosebleeds and bleeding gums.   Prolonged bleeding from cuts.   Severe bleeding during menstrual periods in women.  Sore mouth.   Bacterial or fungal infections. DIAGNOSIS  Blood tests and a bone marrow biopsy are used to diagnose the condition. Additional testing may be done to find the underlying cause of the anemia.  TREATMENT  For mild cases, observation is needed. In severe cases or if complications develop, hospitalization may be necessary. Severe aplastic anemia is life threatening. Treatment may include:   Blood transfusions.   Medicines. Medicines may be given to suppress the immune system if you have an autoimmune disorder.  They may also be given to stimulate marrow to make more blood cells.  A procedure where healthy marrow from a donor is given to the person with aplastic anemia (bone marrow transplant). A bone marrow transplant is used to treat severe aplastic anemia. After the transplant, drugs are needed to help prevent the body from rejecting the new marrow. If the procedure is successful, the healthy marrow will  begin producing new blood cells. Bone marrow transplants carry risk, and not everyone is a candidate for transplantation. If the body rejects the transplant, it can be life threatening. HOME CARE INSTRUCTIONS   Get plenty of rest and eat a well-balanced diet.   Avoid excessive exercise. Long-term anemia can stress the heart.   When platelets are at low levels, avoid all activities that risk injury. This is important because the risk of bleeding is greater.   Only take over-the-counter or prescription medicines for pain, fever, or discomfort as directed by your health care provider.   Take antibiotics as directed by your health care provider. Make sure you finish them even if you start to feel better.   Protect yourself from infections by washing your hands often. Avoid crowds. Avoid being around sick people.   Keep all follow-up appointments.  To prevent aplastic anemia from returning, avoid exposure to toxic chemicals such as:   Insecticides.   Herbicides.   Organic solvents.   Paint removers. SEEK IMMEDIATE MEDICAL CARE IF:  You have a fever or persistent symptoms for more than 2-3 days.   You have a fever and your symptoms suddenly get worse.   You develop flu-like symptoms.   You develop signs of infection.   You develop infections more frequently.   You have blood in your urine or bowel movements.   You are bruising easily.   You are bleeding from your gums or nose.   You have prolonged bleeding from cuts.   You have increasing shortness of breath or chest pain with exertion.   You develop a rapid heart rate with exertion.   You have increasing fatigue and tiredness.   You develop lightheadedness or fainting.   You develop pale skin and lips.   You develop a sore mouth. MAKE SURE YOU:   Understand these instructions.  Will watch your condition.  Will get help right away if you are not doing well or get worse. Document  Released: 07/29/2007 Document Revised: 10/06/2013 Document Reviewed: 03/27/2013 [facility] Patient Information 2015 [place], [place]. This information is not intended to replace advice given to you by your health care provider. Make sure you discuss any questions you have with your health care provider.

## 2015-06-23 ENCOUNTER — Ambulatory Visit (HOSPITAL_BASED_OUTPATIENT_CLINIC_OR_DEPARTMENT_OTHER): Payer: Medicare Other | Admitting: Family

## 2015-06-23 ENCOUNTER — Other Ambulatory Visit (HOSPITAL_BASED_OUTPATIENT_CLINIC_OR_DEPARTMENT_OTHER): Payer: Medicare Other

## 2015-06-23 ENCOUNTER — Encounter: Payer: Self-pay | Admitting: Family

## 2015-06-23 ENCOUNTER — Ambulatory Visit (HOSPITAL_BASED_OUTPATIENT_CLINIC_OR_DEPARTMENT_OTHER): Payer: Medicare Other

## 2015-06-23 ENCOUNTER — Telehealth: Payer: Self-pay | Admitting: *Deleted

## 2015-06-23 VITALS — BP 83/47 | HR 95 | Temp 97.8°F | Wt 107.0 lb

## 2015-06-23 VITALS — BP 103/55 | HR 60 | Temp 98.2°F | Resp 18

## 2015-06-23 DIAGNOSIS — D649 Anemia, unspecified: Secondary | ICD-10-CM | POA: Diagnosis not present

## 2015-06-23 DIAGNOSIS — Z23 Encounter for immunization: Secondary | ICD-10-CM | POA: Diagnosis not present

## 2015-06-23 DIAGNOSIS — D464 Refractory anemia, unspecified: Secondary | ICD-10-CM

## 2015-06-23 DIAGNOSIS — D460 Refractory anemia without ring sideroblasts, so stated: Secondary | ICD-10-CM | POA: Diagnosis not present

## 2015-06-23 DIAGNOSIS — D469 Myelodysplastic syndrome, unspecified: Secondary | ICD-10-CM | POA: Diagnosis not present

## 2015-06-23 DIAGNOSIS — Z299 Encounter for prophylactic measures, unspecified: Secondary | ICD-10-CM

## 2015-06-23 LAB — MANUAL DIFFERENTIAL (CHCC SATELLITE)
ALC: 0.7 10*3/uL (ref 0.6–2.2)
ANC (CHCC MAN DIFF): 1 10*3/uL — AB (ref 1.5–6.7)
BAND NEUTROPHILS: 5 % (ref 0–10)
BASO: 4 % — AB (ref 0–2)
Eos: 4 % (ref 0–7)
LYMPH: 38 % (ref 14–48)
MONO: 2 % (ref 0–13)
PLT EST ~~LOC~~: ADEQUATE
SEG: 47 % (ref 40–75)

## 2015-06-23 LAB — CBC WITH DIFFERENTIAL (CANCER CENTER ONLY)
HCT: 16.4 % — ABNORMAL LOW (ref 34.8–46.6)
HGB: 5.2 g/dL — CL (ref 11.6–15.9)
MCH: 30.6 pg (ref 26.0–34.0)
MCHC: 31.7 g/dL — ABNORMAL LOW (ref 32.0–36.0)
MCV: 97 fL (ref 81–101)
PLATELETS: 164 10*3/uL (ref 145–400)
RBC: 1.7 10*6/uL — AB (ref 3.70–5.32)
RDW: 22.1 % — AB (ref 11.1–15.7)
WBC: 1.9 10*3/uL — AB (ref 3.9–10.0)

## 2015-06-23 LAB — COMPREHENSIVE METABOLIC PANEL
ALT: 37 U/L — AB (ref 6–29)
AST: 27 U/L (ref 10–35)
Albumin: 2.9 g/dL — ABNORMAL LOW (ref 3.6–5.1)
Alkaline Phosphatase: 88 U/L (ref 33–130)
BILIRUBIN TOTAL: 0.5 mg/dL (ref 0.2–1.2)
BUN: 27 mg/dL — AB (ref 7–25)
CO2: 26 mmol/L (ref 20–31)
CREATININE: 1.17 mg/dL — AB (ref 0.60–0.88)
Calcium: 9.1 mg/dL (ref 8.6–10.4)
Chloride: 101 mmol/L (ref 98–110)
GLUCOSE: 140 mg/dL — AB (ref 65–99)
Potassium: 3.6 mmol/L (ref 3.5–5.3)
SODIUM: 135 mmol/L (ref 135–146)
TOTAL PROTEIN: 5.5 g/dL — AB (ref 6.1–8.1)

## 2015-06-23 LAB — HOLD TUBE, BLOOD BANK

## 2015-06-23 LAB — PREPARE RBC (CROSSMATCH)

## 2015-06-23 LAB — RETICULOCYTES (CHCC)
ABS RETIC: 22.2 10*3/uL (ref 19.0–186.0)
RBC.: 1.71 MIL/uL — AB (ref 3.87–5.11)
RETIC CT PCT: 1.3 % (ref 0.4–2.3)

## 2015-06-23 MED ORDER — DIPHENHYDRAMINE HCL 25 MG PO CAPS
ORAL_CAPSULE | ORAL | Status: AC
  Filled 2015-06-23: qty 1

## 2015-06-23 MED ORDER — DIPHENHYDRAMINE HCL 25 MG PO CAPS
25.0000 mg | ORAL_CAPSULE | Freq: Once | ORAL | Status: AC
  Administered 2015-06-23: 25 mg via ORAL

## 2015-06-23 MED ORDER — ACETAMINOPHEN 325 MG PO TABS
ORAL_TABLET | ORAL | Status: AC
  Filled 2015-06-23: qty 2

## 2015-06-23 MED ORDER — ACETAMINOPHEN 325 MG PO TABS
650.0000 mg | ORAL_TABLET | Freq: Once | ORAL | Status: AC
  Administered 2015-06-23: 650 mg via ORAL

## 2015-06-23 MED ORDER — INFLUENZA VAC SPLIT QUAD 0.5 ML IM SUSY
0.5000 mL | PREFILLED_SYRINGE | Freq: Once | INTRAMUSCULAR | Status: AC
  Administered 2015-06-23: 0.5 mL via INTRAMUSCULAR
  Filled 2015-06-23: qty 0.5

## 2015-06-23 MED ORDER — FUROSEMIDE 10 MG/ML IJ SOLN: 20.0000 mg | Freq: Once | INTRAMUSCULAR | Status: DC

## 2015-06-23 MED ORDER — SODIUM CHLORIDE 0.9 % IV SOLN
250.0000 mL | Freq: Once | INTRAVENOUS | Status: AC
  Administered 2015-06-23: 250 mL via INTRAVENOUS

## 2015-06-23 NOTE — Telephone Encounter (Signed)
Critical Value HGB 5.2 Laverna Peace NP aware and orders placed

## 2015-06-23 NOTE — Progress Notes (Signed)
Hematology and Oncology Follow Up Visit  Monica Riggs [id number] [birth year] 81 y.o. 06/23/2015   Principle Diagnosis:  Refractory anemia Myelodysplasia   Current Therapy:   Supportive care with transfusions    Interim History: Ms. Name is here today with her daughter for labs and follow-up. She was in the ED on September 5th for swelling and pain in the right side of her face. She was found to have a periapical abscess and received IV antibiotics in the ED. She is taking Cipro at home now. The swelling and pain have resolved for the most part. She has contacted her dentist and is waiting for an appointment date and time.  Her Hgb in the ED was 6.1 but she preferred to wait and receive blood as an outpatient here. Her Hgb today is 5.2. We will give her 2 units of blood today.  She is fatigued and not eating or drinking well at home. Her weight is down at 107 today. She is now living with her daughter who takes excellent care of her. She denies fever, chills, n/v, cough, rash, headache, dizziness, chest pain, palpitations, abdominal pain, changes in bowel or bladder habits. There has been no blood in urine or stool. She has occasional constipation that is relieved with Mirilax.  No c/o tenderness, numbness or tingling in her extremities. No new aches or pains. She does well ambulating with a cane and has not had any falls.   Medications:    Medication List       This list is accurate as of: 06/23/15 11:22 AM.  Always use your most recent med list.               aspirin EC 81 MG tablet  Take 81 mg by mouth daily.     brimonidine 0.2 % ophthalmic solution  Commonly known as:  ALPHAGAN  Place 1 drop into both eyes 2 (two) times daily.     cholecalciferol 1000 UNITS tablet  Commonly known as:  VITAMIN D  Take 1,000 Units by mouth daily.     ciprofloxacin 500 MG/5ML (10%) suspension  Commonly known as:  CIPRO  Take 2.5 mLs (250 mg total) by mouth 2 (two) times daily.     diltiazem 240 MG 24 hr capsule  Commonly known as:  CARDIZEM CD  TAKE ONE (1) CAPSULE EACH DAY     Fish Oil 1000 MG Caps  Take 1 capsule by mouth daily.     folic acid 536 MCG tablet  Commonly known as:  FOLVITE  Take 400 mcg by mouth 2 (two) times daily.     hydrochlorothiazide 25 MG tablet  Commonly known as:  HYDRODIURIL  TAKE 1 TABLET BY MOUTH DAILY     levothyroxine 50 MCG tablet  Commonly known as:  SYNTHROID, LEVOTHROID  TAKE ONE (1) TABLET EACH DAY     MUCINEX DM PO  Take by mouth.     naproxen sodium 220 MG tablet  Commonly known as:  ANAPROX  Take 220 mg by mouth daily as needed. For pain     olmesartan 40 MG tablet  Commonly known as:  BENICAR  TAKE ONE (1) TABLET EACH DAY     polyethylene glycol packet  Commonly known as:  MIRALAX / GLYCOLAX  Take 17 g by mouth daily as needed for mild constipation.        Allergies:  Allergies  Allergen Reactions  . Sulfa Antibiotics Rash    Past Medical History, Surgical history, Social  history, and Family History were reviewed and updated.  Review of Systems: All other 10 point review of systems is negative.   Physical Exam:  weight is 107 lb (48.535 kg). Her oral temperature is 97.8 F (36.6 C). Her blood pressure is 83/47 and her pulse is 95.   Wt Readings from Last 3 Encounters:  06/23/15 107 lb (48.535 kg)  06/20/15 112 lb (50.803 kg)  05/30/15 108 lb (48.988 kg)   Ocular: Sclerae unicteric, pupils equal, round and reactive to light Ear-nose-throat: Oropharynx clear, dentition fair Lymphatic: No cervical or supraclavicular adenopathy Lungs no rales or rhonchi, good excursion bilaterally Heart regular rate and rhythm, no murmur appreciated Abd soft, nontender, positive bowel sounds MSK no focal spinal tenderness, no joint edema Neuro: non-focal, well-oriented, appropriate affect Breasts: Deferred  Lab Results  Component Value Date   WBC 1.9* 06/23/2015   HGB 5.2* 06/23/2015   HCT 16.4*  06/23/2015   MCV 97 06/23/2015   PLT 164 06/23/2015   Lab Results  Component Value Date   FERRITIN 3,053* 01/04/2015   IRON 169* 01/04/2015   TIBC 161* 01/04/2015   UIBC <1.0 01/04/2015   IRONPCTSAT >100 01/04/2015   Lab Results  Component Value Date   RETICCTPCT 1.1 05/30/2015   RBC 1.70* 06/23/2015   RETICCTABS 30.8 05/30/2015   No results found for: KPAFRELGTCHN, LAMBDASER, KAPLAMBRATIO No results found for: Kandis Cocking, [facility] Lab Results  Component Value Date   TOTALPROTELP 6.3 09/22/2012   ALBUMINELP 59.8 09/22/2012   A1GS 6.4* 09/22/2012   A2GS 10.7 09/22/2012   BETS 5.7 09/22/2012   BETA2SER 4.6 09/22/2012   GAMS 12.8 09/22/2012   MSPIKE NOT DET 09/22/2012   SPEI * 09/22/2012     Chemistry      Component Value Date/Time   NA 133* 06/20/2015 1420   NA 135 05/30/2015 1051   NA 136 05/11/2015 1045   NA 139 01/11/2015 1614   K 3.4* 06/20/2015 1420   K 4.0 05/30/2015 1051   K 3.5 05/11/2015 1045   CL 97* 06/20/2015 1420   CL 97* 05/30/2015 1051   CO2 28 06/20/2015 1420   CO2 29 05/30/2015 1051   CO2 29 05/11/2015 1045   BUN 21* 06/20/2015 1420   BUN 17 05/30/2015 1051   BUN 26.6* 05/11/2015 1045   BUN 14 01/11/2015 1614   CREATININE 1.07* 06/20/2015 1420   CREATININE 1.0 05/30/2015 1051   CREATININE 1.0 05/11/2015 1045      Component Value Date/Time   CALCIUM 9.7 06/20/2015 1420   CALCIUM 9.9 05/30/2015 1051   CALCIUM 9.8 05/11/2015 1045   ALKPHOS 74 05/30/2015 1051   ALKPHOS 94 05/11/2015 1045   ALKPHOS 76 03/09/2015 0406   AST 29 05/30/2015 1051   AST 20 05/11/2015 1045   AST 33 03/09/2015 0406   ALT 35 05/30/2015 1051   ALT 29 05/11/2015 1045   ALT 43 03/09/2015 0406   BILITOT 0.80 05/30/2015 1051   BILITOT 0.58 05/11/2015 1045   BILITOT 0.6 03/09/2015 0406   BILITOT 0.8 01/11/2015 1614     Impression and Plan: Ms. Vasbinder is a 81 year old white female with myelodysplasia. She now has a periapical abscess on the right side and is on  Cipro. This is resolving nicely and she is planning to see her dentist soon.  Her hgb is down to 5.2 so we will transfuse her 2 units today. She is symptomatic with fatigue.  We will plan to see her back in 3-4 weeks for  lab work and follow-up.  Both she and her daughter know to contact us with any questions or concerns. We can certainly see her sooner if need be.  Eliezer Bottom, NP 9/8/201611:22 AM

## 2015-06-23 NOTE — Patient Instructions (Signed)

## 2015-06-24 LAB — TYPE AND SCREEN
ABO/RH(D): O NEG
ANTIBODY SCREEN: NEGATIVE
UNIT DIVISION: 0
Unit division: 0

## 2015-07-11 ENCOUNTER — Other Ambulatory Visit: Payer: Self-pay | Admitting: Family Medicine

## 2015-07-18 ENCOUNTER — Ambulatory Visit (HOSPITAL_COMMUNITY)
Admission: RE | Admit: 2015-07-18 | Discharge: 2015-07-18 | Disposition: A | Discharge status: Home / Self Care | Payer: Medicare Other | Source: Ambulatory Visit | Attending: Hematology & Oncology | Admitting: Hematology & Oncology

## 2015-07-18 DIAGNOSIS — D464 Refractory anemia, unspecified: Secondary | ICD-10-CM | POA: Insufficient documentation

## 2015-07-18 DIAGNOSIS — D469 Myelodysplastic syndrome, unspecified: Secondary | ICD-10-CM | POA: Insufficient documentation

## 2015-07-20 ENCOUNTER — Encounter: Payer: Self-pay | Admitting: Family

## 2015-07-20 ENCOUNTER — Ambulatory Visit (HOSPITAL_BASED_OUTPATIENT_CLINIC_OR_DEPARTMENT_OTHER): Payer: Medicare Other | Admitting: Family

## 2015-07-20 ENCOUNTER — Other Ambulatory Visit (HOSPITAL_BASED_OUTPATIENT_CLINIC_OR_DEPARTMENT_OTHER): Payer: Medicare Other

## 2015-07-20 ENCOUNTER — Telehealth: Payer: Self-pay | Admitting: *Deleted

## 2015-07-20 ENCOUNTER — Ambulatory Visit (HOSPITAL_BASED_OUTPATIENT_CLINIC_OR_DEPARTMENT_OTHER): Payer: Medicare Other

## 2015-07-20 VITALS — BP 106/39 | HR 108 | Temp 98.0°F | Resp 16 | Ht 61.0 in | Wt 108.0 lb

## 2015-07-20 VITALS — BP 120/66 | HR 76 | Temp 97.7°F | Resp 16

## 2015-07-20 DIAGNOSIS — D469 Myelodysplastic syndrome, unspecified: Secondary | ICD-10-CM | POA: Diagnosis not present

## 2015-07-20 DIAGNOSIS — D460 Refractory anemia without ring sideroblasts, so stated: Secondary | ICD-10-CM | POA: Diagnosis not present

## 2015-07-20 DIAGNOSIS — D4622 Refractory anemia with excess of blasts 2: Secondary | ICD-10-CM | POA: Diagnosis not present

## 2015-07-20 DIAGNOSIS — D464 Refractory anemia, unspecified: Secondary | ICD-10-CM

## 2015-07-20 DIAGNOSIS — D649 Anemia, unspecified: Secondary | ICD-10-CM

## 2015-07-20 DIAGNOSIS — Z299 Encounter for prophylactic measures, unspecified: Secondary | ICD-10-CM

## 2015-07-20 LAB — COMPREHENSIVE METABOLIC PANEL (CC13)
ALBUMIN: 2.8 g/dL — AB (ref 3.5–5.0)
ALK PHOS: 85 U/L (ref 40–150)
ALT: 48 U/L (ref 0–55)
AST: 28 U/L (ref 5–34)
Anion Gap: 5 mEq/L (ref 3–11)
BILIRUBIN TOTAL: 0.63 mg/dL (ref 0.20–1.20)
BUN: 27.8 mg/dL — AB (ref 7.0–26.0)
CALCIUM: 9.4 mg/dL (ref 8.4–10.4)
CO2: 30 mEq/L — ABNORMAL HIGH (ref 22–29)
CREATININE: 1 mg/dL (ref 0.6–1.1)
Chloride: 102 mEq/L (ref 98–109)
EGFR: 51 mL/min/{1.73_m2} — ABNORMAL LOW (ref >90)
Glucose: 102 mg/dl (ref 70–140)
POTASSIUM: 4 meq/L (ref 3.5–5.1)
Sodium: 137 mEq/L (ref 136–145)
TOTAL PROTEIN: 5.6 g/dL — AB (ref 6.4–8.3)

## 2015-07-20 LAB — PREPARE RBC (CROSSMATCH)

## 2015-07-20 LAB — RETICULOCYTES (CHCC)
ABS Retic: 20.6 10*3/uL (ref 19.0–186.0)
RBC.: 1.87 MIL/uL — ABNORMAL LOW (ref 3.87–5.11)
RETIC CT PCT: 1.1 % (ref 0.4–2.3)

## 2015-07-20 LAB — MANUAL DIFFERENTIAL (CHCC SATELLITE)
ALC: 0.8 10*3/uL (ref 0.6–2.2)
ANC (CHCC MAN DIFF): 2.2 10*3/uL (ref 1.5–6.7)
Band Neutrophils: 4 % (ref 0–10)
EOS: 6 % (ref 0–7)
LYMPH: 24 % (ref 14–48)
Metamyelocytes: 2 % — ABNORMAL HIGH (ref 0–0)
PLT EST ~~LOC~~: ADEQUATE
SEG: 64 % (ref 40–75)

## 2015-07-20 LAB — CBC WITH DIFFERENTIAL (CANCER CENTER ONLY)
HCT: 16.3 % — ABNORMAL LOW (ref 34.8–46.6)
HEMOGLOBIN: 5.2 g/dL — AB (ref 11.6–15.9)
MCH: 28.7 pg (ref 26.0–34.0)
MCHC: 31.9 g/dL — AB (ref 32.0–36.0)
MCV: 90 fL (ref 81–101)
Platelets: 174 10*3/uL (ref 145–400)
RBC: 1.81 10*6/uL — ABNORMAL LOW (ref 3.70–5.32)
RDW: 21.9 % — ABNORMAL HIGH (ref 11.1–15.7)
WBC: 3.2 10*3/uL — AB (ref 3.9–10.0)

## 2015-07-20 LAB — HOLD TUBE, BLOOD BANK - CHCC SATELLITE

## 2015-07-20 MED ORDER — ACETAMINOPHEN 325 MG PO TABS
ORAL_TABLET | ORAL | Status: AC
  Filled 2015-07-20: qty 2

## 2015-07-20 MED ORDER — SODIUM CHLORIDE 0.9 % IV SOLN
250.0000 mL | Freq: Once | INTRAVENOUS | Status: AC
  Administered 2015-07-20: 250 mL via INTRAVENOUS

## 2015-07-20 MED ORDER — DIPHENHYDRAMINE HCL 25 MG PO CAPS
ORAL_CAPSULE | ORAL | Status: AC
  Filled 2015-07-20: qty 1

## 2015-07-20 MED ORDER — DIPHENHYDRAMINE HCL 25 MG PO CAPS
25.0000 mg | ORAL_CAPSULE | Freq: Once | ORAL | Status: AC
  Administered 2015-07-20: 25 mg via ORAL

## 2015-07-20 MED ORDER — FUROSEMIDE 10 MG/ML IJ SOLN: 20.0000 mg | Freq: Once | INTRAMUSCULAR | Status: DC

## 2015-07-20 MED ORDER — ACETAMINOPHEN 325 MG PO TABS
650.0000 mg | ORAL_TABLET | Freq: Once | ORAL | Status: AC
  Administered 2015-07-20: 650 mg via ORAL

## 2015-07-20 NOTE — Telephone Encounter (Signed)
Critical Value HGB 5.2 Monica Peace NP notified. She will place orders

## 2015-07-20 NOTE — Progress Notes (Signed)
Hematology and Oncology Follow Up Visit  HELLEN SHANLEY [id number] 1919/03/17 81 y.o. 07/20/2015   Principle Diagnosis:  Refractory anemia Myelodysplasia   Current Therapy:   Supportive care with transfusions    Interim History: Ms. Azbell is here today with her daughter for labs and follow-up. She is still fatigued and sleeps "most of the day and all night."  She has SOB with exertion.  She is eating vegetables but has no taste for meat. She is not drinking much fluid. Her weight is actually up 1 lb this visit.  Her Hgb today is 5.2 with an MCV of 90. She denies having any episodes of bleeding.  She received 2 units of blood 4 week ago.  No fever, chills, n/v, cough, rash, headache, dizziness, chest pain, palpitations, abdominal pain, changes in bowel or bladder habits.  No c/o tenderness, numbness or tingling in her extremities. No new aches or pains.  She does try to stay somewhat active walking laps inside her daughters house several times a day. She uses her cane to ambulate and has had no falls.   Medications:    Medication List       This list is accurate as of: 07/20/15  8:58 AM.  Always use your most recent med list.               aspirin EC 81 MG tablet  Take 81 mg by mouth daily.     brimonidine 0.2 % ophthalmic solution  Commonly known as:  ALPHAGAN  Place 1 drop into both eyes 2 (two) times daily.     cholecalciferol 1000 UNITS tablet  Commonly known as:  VITAMIN D  Take 1,000 Units by mouth daily.     diltiazem 240 MG 24 hr capsule  Commonly known as:  CARDIZEM CD  TAKE ONE (1) CAPSULE EACH DAY     Fish Oil 1000 MG Caps  Take 1 capsule by mouth daily.     folic acid 353 MCG tablet  Commonly known as:  FOLVITE  Take 400 mcg by mouth 2 (two) times daily.     hydrochlorothiazide 25 MG tablet  Commonly known as:  HYDRODIURIL  TAKE 1 TABLET BY MOUTH DAILY     levothyroxine 50 MCG tablet  Commonly known as:  SYNTHROID, LEVOTHROID  TAKE 1 TABLET  EVERY MORNING     MUCINEX DM PO  Take by mouth.     naproxen sodium 220 MG tablet  Commonly known as:  ANAPROX  Take 220 mg by mouth daily as needed. For pain     olmesartan 40 MG tablet  Commonly known as:  BENICAR  TAKE ONE (1) TABLET EACH DAY     polyethylene glycol packet  Commonly known as:  MIRALAX / GLYCOLAX  Take 17 g by mouth daily as needed for mild constipation.        Allergies:  Allergies  Allergen Reactions  . Sulfa Antibiotics Rash    Past Medical History, Surgical history, Social history, and Family History were reviewed and updated.  Review of Systems: All other 10 point review of systems is negative.   Physical Exam:  vitals were not taken for this visit.  Wt Readings from Last 3 Encounters:  06/23/15 107 lb (48.535 kg)  06/20/15 112 lb (50.803 kg)  05/30/15 108 lb (48.988 kg)   Ocular: Sclerae unicteric, pupils equal, round and reactive to light Ear-nose-throat: Oropharynx clear, dentition fair Lymphatic: No cervical or supraclavicular adenopathy Lungs no rales or rhonchi, good  excursion bilaterally Heart regular rate and rhythm, no murmur appreciated Abd soft, nontender, positive bowel sounds MSK no focal spinal tenderness, no joint edema Neuro: non-focal, well-oriented, appropriate affect Breasts: Deferred  Lab Results  Component Value Date   WBC 1.9* 06/23/2015   HGB 5.2* 06/23/2015   HCT 16.4* 06/23/2015   MCV 97 06/23/2015   PLT 164 06/23/2015   Lab Results  Component Value Date   FERRITIN 3,053* 01/04/2015   IRON 169* 01/04/2015   TIBC 161* 01/04/2015   UIBC <1.0 01/04/2015   IRONPCTSAT >100 01/04/2015   Lab Results  Component Value Date   RETICCTPCT 1.3 06/23/2015   RBC 1.71* 06/23/2015   RETICCTABS 22.2 06/23/2015   No results found for: KPAFRELGTCHN, LAMBDASER, KAPLAMBRATIO No results found for: Kandis Cocking, [facility] Lab Results  Component Value Date   TOTALPROTELP 6.3 09/22/2012   ALBUMINELP 59.8 09/22/2012    A1GS 6.4* 09/22/2012   A2GS 10.7 09/22/2012   BETS 5.7 09/22/2012   BETA2SER 4.6 09/22/2012   GAMS 12.8 09/22/2012   MSPIKE NOT DET 09/22/2012   SPEI * 09/22/2012     Chemistry      Component Value Date/Time   NA 135 06/23/2015 0858   NA 135 05/30/2015 1051   NA 136 05/11/2015 1045   NA 139 01/11/2015 1614   K 3.6 06/23/2015 0858   K 4.0 05/30/2015 1051   K 3.5 05/11/2015 1045   CL 101 06/23/2015 0858   CL 97* 05/30/2015 1051   CO2 26 06/23/2015 0858   CO2 29 05/30/2015 1051   CO2 29 05/11/2015 1045   BUN 27* 06/23/2015 0858   BUN 17 05/30/2015 1051   BUN 26.6* 05/11/2015 1045   BUN 14 01/11/2015 1614   CREATININE 1.17* 06/23/2015 0858   CREATININE 1.0 05/30/2015 1051   CREATININE 1.0 05/11/2015 1045      Component Value Date/Time   CALCIUM 9.1 06/23/2015 0858   CALCIUM 9.9 05/30/2015 1051   CALCIUM 9.8 05/11/2015 1045   ALKPHOS 88 06/23/2015 0858   ALKPHOS 74 05/30/2015 1051   ALKPHOS 94 05/11/2015 1045   AST 27 06/23/2015 0858   AST 29 05/30/2015 1051   AST 20 05/11/2015 1045   ALT 37* 06/23/2015 0858   ALT 35 05/30/2015 1051   ALT 29 05/11/2015 1045   BILITOT 0.5 06/23/2015 0858   BILITOT 0.80 05/30/2015 1051   BILITOT 0.58 05/11/2015 1045   BILITOT 0.8 01/11/2015 1614     Impression and Plan: Ms. Nierman is a 81 year old white female with myelodysplasia and refractory anemia. Her hgb today is 5.2. She is symptomatic with fatigue, SOB and decreased appetite.  We will give her 2 units of blood today while she is here. We will also give her a little fluid for dehydration.  We will plan to see her back in 4 weeks for lab work and follow-up.  Both she and her daughter know to contact us with any questions or concerns. We can certainly see her sooner if need be.  Eliezer Bottom, NP 10/5/20168:58 AM

## 2015-07-20 NOTE — Patient Instructions (Signed)

## 2015-07-21 LAB — TYPE AND SCREEN
ABO/RH(D): O NEG
ANTIBODY SCREEN: NEGATIVE
UNIT DIVISION: 0
Unit division: 0

## 2015-07-28 ENCOUNTER — Other Ambulatory Visit: Payer: Self-pay | Admitting: Family Medicine

## 2015-07-29 ENCOUNTER — Other Ambulatory Visit: Payer: Self-pay | Admitting: Family Medicine

## 2015-07-29 MED ORDER — HYDROCHLOROTHIAZIDE 25 MG PO TABS: 25.0000 mg | ORAL_TABLET | Freq: Every day | ORAL | Status: DC

## 2015-07-29 NOTE — Telephone Encounter (Signed)
done

## 2015-08-16 ENCOUNTER — Ambulatory Visit (HOSPITAL_COMMUNITY)
Admission: RE | Admit: 2015-08-16 | Discharge: 2015-08-16 | Disposition: A | Discharge status: Home / Self Care | Payer: Medicare Other | Source: Ambulatory Visit | Attending: Hematology & Oncology | Admitting: Hematology & Oncology

## 2015-08-16 DIAGNOSIS — D4620 Refractory anemia with excess of blasts, unspecified: Secondary | ICD-10-CM | POA: Insufficient documentation

## 2015-08-16 DIAGNOSIS — D464 Refractory anemia, unspecified: Secondary | ICD-10-CM

## 2015-08-17 ENCOUNTER — Ambulatory Visit (HOSPITAL_BASED_OUTPATIENT_CLINIC_OR_DEPARTMENT_OTHER): Payer: Medicare Other

## 2015-08-17 ENCOUNTER — Ambulatory Visit (HOSPITAL_BASED_OUTPATIENT_CLINIC_OR_DEPARTMENT_OTHER): Payer: Medicare Other | Admitting: Family

## 2015-08-17 ENCOUNTER — Telehealth: Payer: Self-pay | Admitting: *Deleted

## 2015-08-17 ENCOUNTER — Encounter: Payer: Self-pay | Admitting: Family

## 2015-08-17 ENCOUNTER — Other Ambulatory Visit (HOSPITAL_BASED_OUTPATIENT_CLINIC_OR_DEPARTMENT_OTHER): Payer: Medicare Other

## 2015-08-17 VITALS — BP 94/40 | HR 75 | Temp 97.9°F | Resp 18

## 2015-08-17 VITALS — BP 100/43 | HR 90 | Temp 97.6°F | Resp 14 | Ht 61.0 in | Wt 107.0 lb

## 2015-08-17 DIAGNOSIS — D4620 Refractory anemia with excess of blasts, unspecified: Secondary | ICD-10-CM | POA: Diagnosis not present

## 2015-08-17 DIAGNOSIS — D469 Myelodysplastic syndrome, unspecified: Secondary | ICD-10-CM

## 2015-08-17 DIAGNOSIS — D464 Refractory anemia, unspecified: Secondary | ICD-10-CM

## 2015-08-17 DIAGNOSIS — D460 Refractory anemia without ring sideroblasts, so stated: Secondary | ICD-10-CM | POA: Diagnosis not present

## 2015-08-17 LAB — COMPREHENSIVE METABOLIC PANEL (CC13)
ALT: 29 U/L (ref 0–55)
ANION GAP: 5 meq/L (ref 3–11)
AST: 21 U/L (ref 5–34)
Albumin: 2.9 g/dL — ABNORMAL LOW (ref 3.5–5.0)
Alkaline Phosphatase: 96 U/L (ref 40–150)
BUN: 44.5 mg/dL — ABNORMAL HIGH (ref 7.0–26.0)
CALCIUM: 10.1 mg/dL (ref 8.4–10.4)
CHLORIDE: 105 meq/L (ref 98–109)
CO2: 30 meq/L — AB (ref 22–29)
Creatinine: 1.2 mg/dL — ABNORMAL HIGH (ref 0.6–1.1)
EGFR: 37 mL/min/{1.73_m2} — AB (ref >90)
Glucose: 153 mg/dl — ABNORMAL HIGH (ref 70–140)
POTASSIUM: 4.2 meq/L (ref 3.5–5.1)
Sodium: 140 mEq/L (ref 136–145)
Total Bilirubin: 0.39 mg/dL (ref 0.20–1.20)
Total Protein: 6.1 g/dL — ABNORMAL LOW (ref 6.4–8.3)

## 2015-08-17 LAB — MANUAL DIFFERENTIAL (CHCC SATELLITE)
ALC: 1 10*3/uL (ref 0.6–2.2)
ANC (CHCC MAN DIFF): 3.8 10*3/uL (ref 1.5–6.7)
Band Neutrophils: 6 % (ref 0–10)
Eos: 4 % (ref 0–7)
LYMPH: 19 % (ref 14–48)
MONO: 2 % (ref 0–13)
MYELOCYTES: 1 % — AB (ref 0–0)
PLATELET MORPHOLOGY: NORMAL
PLT EST ~~LOC~~: DECREASED
SEG: 68 % (ref 40–75)

## 2015-08-17 LAB — CBC WITH DIFFERENTIAL (CANCER CENTER ONLY)
HCT: 19.3 % — ABNORMAL LOW (ref 34.8–46.6)
HEMOGLOBIN: 6.1 g/dL — AB (ref 11.6–15.9)
MCH: 30.2 pg (ref 26.0–34.0)
MCHC: 31.6 g/dL — ABNORMAL LOW (ref 32.0–36.0)
MCV: 96 fL (ref 81–101)
PLATELETS: 155 10*3/uL (ref 145–400)
RBC: 2.02 10*6/uL — AB (ref 3.70–5.32)
RDW: 22.4 % — AB (ref 11.1–15.7)
WBC: 5.1 10*3/uL (ref 3.9–10.0)

## 2015-08-17 LAB — PREPARE RBC (CROSSMATCH)

## 2015-08-17 LAB — HOLD TUBE, BLOOD BANK - CHCC SATELLITE

## 2015-08-17 MED ORDER — DIPHENHYDRAMINE HCL 25 MG PO CAPS
ORAL_CAPSULE | ORAL | Status: AC
  Filled 2015-08-17: qty 1

## 2015-08-17 MED ORDER — SODIUM CHLORIDE 0.9 % IV SOLN
250.0000 mL | Freq: Once | INTRAVENOUS | Status: AC
  Administered 2015-08-17: 250 mL via INTRAVENOUS

## 2015-08-17 MED ORDER — ACETAMINOPHEN 325 MG PO TABS
ORAL_TABLET | ORAL | Status: AC
  Filled 2015-08-17: qty 2

## 2015-08-17 MED ORDER — ACETAMINOPHEN 325 MG PO TABS
650.0000 mg | ORAL_TABLET | Freq: Once | ORAL | Status: AC
  Administered 2015-08-17: 650 mg via ORAL

## 2015-08-17 MED ORDER — FUROSEMIDE 10 MG/ML IJ SOLN: 20.0000 mg | Freq: Once | INTRAMUSCULAR | Status: DC

## 2015-08-17 MED ORDER — DIPHENHYDRAMINE HCL 25 MG PO CAPS
25.0000 mg | ORAL_CAPSULE | Freq: Once | ORAL | Status: AC
  Administered 2015-08-17: 25 mg via ORAL

## 2015-08-17 NOTE — Telephone Encounter (Signed)
Critical Value HGB 6.1 Laverna Peace NP aware. Orders will be entered

## 2015-08-17 NOTE — Patient Instructions (Signed)

## 2015-08-17 NOTE — Progress Notes (Signed)
Hematology and Oncology Follow Up Visit  Monica Riggs [id number] 30-Mar-1919 81 y.o. 08/17/2015   Principle Diagnosis:  Refractory anemia Myelodysplasia   Current Therapy:   Supportive care with transfusions    Interim History: Monica Riggs is here today with her daughter for labs and follow-up. She has no energy and is sleeping most of the time. She is quite HOH so most of today's information was collected from her daughter.  Her appetite continues to decrease. She is drinking some fluids. She is down 1 lb since her last visit.  Her Hgb today is 6.1 with an MCV of 96. She last received blood on October 5th. She has had no episodes of bleeding or bruising.   No fever, chills, n/v, cough, rash, headache, dizziness, chest pain, palpitations, abdominal pain, changes in bowel or bladder habits.  No c/o tenderness, numbness or tingling in her extremities. She did have a near fall where she bumped against the sink in her bathroom. She is still tender in her right side but has no bruising, redness or swelling.    Medications:    Medication List       This list is accurate as of: 08/17/15  9:26 AM.  Always use your most recent med list.               aspirin EC 81 MG tablet  Take 81 mg by mouth daily.     brimonidine 0.2 % ophthalmic solution  Commonly known as:  ALPHAGAN  Place 1 drop into both eyes 2 (two) times daily.     cholecalciferol 1000 UNITS tablet  Commonly known as:  VITAMIN D  Take 1,000 Units by mouth daily.     diltiazem 240 MG 24 hr capsule  Commonly known as:  CARDIZEM CD  TAKE ONE (1) CAPSULE EACH DAY     Fish Oil 1000 MG Caps  Take 1 capsule by mouth daily.     folic acid 347 MCG tablet  Commonly known as:  FOLVITE  Take 400 mcg by mouth 2 (two) times daily.     hydrochlorothiazide 25 MG tablet  Commonly known as:  HYDRODIURIL  Take 1 tablet (25 mg total) by mouth daily.     levothyroxine 50 MCG tablet  Commonly known as:  SYNTHROID, LEVOTHROID    TAKE 1 TABLET EVERY MORNING     MUCINEX DM PO  Take by mouth.     naproxen sodium 220 MG tablet  Commonly known as:  ANAPROX  Take 220 mg by mouth daily as needed. For pain     olmesartan 40 MG tablet  Commonly known as:  BENICAR  TAKE ONE (1) TABLET EACH DAY     polyethylene glycol packet  Commonly known as:  MIRALAX / GLYCOLAX  Take 17 g by mouth daily as needed for mild constipation.        Allergies:  Allergies  Allergen Reactions  . Sulfa Antibiotics Rash    Past Medical History, Surgical history, Social history, and Family History were reviewed and updated.  Review of Systems: All other 10 point review of systems is negative.   Physical Exam:  height is 5\' 1"  (1.549 m) and weight is 107 lb (48.535 kg). Her oral temperature is 97.6 F (36.4 C). Her blood pressure is 100/43 and her pulse is 90. Her respiration is 14.   Wt Readings from Last 3 Encounters:  08/17/15 107 lb (48.535 kg)  07/20/15 108 lb (48.988 kg)  06/23/15 107 lb (48.535  kg)   Ocular: Sclerae unicteric, pupils equal, round and reactive to light Ear-nose-throat: Oropharynx clear, dentition fair Lymphatic: No cervical or supraclavicular adenopathy Lungs no rales or rhonchi, good excursion bilaterally Heart controlled atrial fib Abd soft, nontender, positive bowel sounds MSK no focal spinal tenderness, no joint edema Neuro: non-focal, well-oriented, appropriate affect Breasts: Deferred  Lab Results  Component Value Date   WBC 3.2* 07/20/2015   HGB 5.2* 07/20/2015   HCT 16.3* 07/20/2015   MCV 90 07/20/2015   PLT 174 07/20/2015   Lab Results  Component Value Date   FERRITIN 3,053* 01/04/2015   IRON 169* 01/04/2015   TIBC 161* 01/04/2015   UIBC <1.0 01/04/2015   IRONPCTSAT >100 01/04/2015   Lab Results  Component Value Date   RETICCTPCT 1.1 07/20/2015   RBC 1.87* 07/20/2015   RETICCTABS 20.6 07/20/2015   No results found for: KPAFRELGTCHN, LAMBDASER, KAPLAMBRATIO No results found  for: Kandis Cocking, [facility] Lab Results  Component Value Date   TOTALPROTELP 6.3 09/22/2012   ALBUMINELP 59.8 09/22/2012   A1GS 6.4* 09/22/2012   A2GS 10.7 09/22/2012   BETS 5.7 09/22/2012   BETA2SER 4.6 09/22/2012   GAMS 12.8 09/22/2012   MSPIKE NOT DET 09/22/2012   SPEI * 09/22/2012     Chemistry      Component Value Date/Time   NA 137 07/20/2015 0842   NA 135 06/23/2015 0858   NA 135 05/30/2015 1051   NA 139 01/11/2015 1614   K 4.0 07/20/2015 0842   K 3.6 06/23/2015 0858   K 4.0 05/30/2015 1051   CL 101 06/23/2015 0858   CL 97* 05/30/2015 1051   CO2 30* 07/20/2015 0842   CO2 26 06/23/2015 0858   CO2 29 05/30/2015 1051   BUN 27.8* 07/20/2015 0842   BUN 27* 06/23/2015 0858   BUN 17 05/30/2015 1051   BUN 14 01/11/2015 1614   CREATININE 1.0 07/20/2015 0842   CREATININE 1.17* 06/23/2015 0858   CREATININE 1.0 05/30/2015 1051      Component Value Date/Time   CALCIUM 9.4 07/20/2015 0842   CALCIUM 9.1 06/23/2015 0858   CALCIUM 9.9 05/30/2015 1051   ALKPHOS 85 07/20/2015 0842   ALKPHOS 88 06/23/2015 0858   ALKPHOS 74 05/30/2015 1051   AST 28 07/20/2015 0842   AST 27 06/23/2015 0858   AST 29 05/30/2015 1051   ALT 48 07/20/2015 0842   ALT 37* 06/23/2015 0858   ALT 35 05/30/2015 1051   BILITOT 0.63 07/20/2015 0842   BILITOT 0.5 06/23/2015 0858   BILITOT 0.80 05/30/2015 1051   BILITOT 0.8 01/11/2015 1614     Impression and Plan: Monica Riggs is a 81 year old white female with myelodysplasia and refractory anemia. Her hgb today is 6.1. She is symptomatic with fatigue and had a near fall but thankfully caught herself and was not injured.  We will give her 2 units of blood today while she is here.  We will plan to see her back in 4 weeks for lab work and follow-up.  Both she and her daughter know to contact us with any questions or concerns. We can certainly see her sooner if need be.  Eliezer Bottom, NP 11/2/20169:26 AM

## 2015-08-18 LAB — TYPE AND SCREEN
ABO/RH(D): O NEG
Antibody Screen: NEGATIVE
UNIT DIVISION: 0
Unit division: 0

## 2015-08-18 LAB — RETICULOCYTES (CHCC)
ABS RETIC: 20.7 10*3/uL (ref 19.0–186.0)
RBC.: 2.07 MIL/uL — AB (ref 3.87–5.11)
Retic Ct Pct: 1 % (ref 0.4–2.3)

## 2015-08-29 ENCOUNTER — Other Ambulatory Visit: Payer: Self-pay | Admitting: Family Medicine

## 2015-09-10 DIAGNOSIS — J189 Pneumonia, unspecified organism: Secondary | ICD-10-CM | POA: Diagnosis not present

## 2015-09-10 DIAGNOSIS — D469 Myelodysplastic syndrome, unspecified: Secondary | ICD-10-CM | POA: Diagnosis not present

## 2015-09-10 DIAGNOSIS — E876 Hypokalemia: Secondary | ICD-10-CM | POA: Diagnosis not present

## 2015-09-10 DIAGNOSIS — R0602 Shortness of breath: Secondary | ICD-10-CM | POA: Diagnosis not present

## 2015-09-10 DIAGNOSIS — I10 Essential (primary) hypertension: Secondary | ICD-10-CM | POA: Diagnosis not present

## 2015-09-10 DIAGNOSIS — E8809 Other disorders of plasma-protein metabolism, not elsewhere classified: Secondary | ICD-10-CM | POA: Diagnosis not present

## 2015-09-10 DIAGNOSIS — E860 Dehydration: Secondary | ICD-10-CM | POA: Diagnosis not present

## 2015-09-10 DIAGNOSIS — R739 Hyperglycemia, unspecified: Secondary | ICD-10-CM | POA: Diagnosis not present

## 2015-09-10 DIAGNOSIS — I4891 Unspecified atrial fibrillation: Secondary | ICD-10-CM | POA: Diagnosis not present

## 2015-09-10 DIAGNOSIS — R05 Cough: Secondary | ICD-10-CM | POA: Diagnosis not present

## 2015-09-10 DIAGNOSIS — A419 Sepsis, unspecified organism: Secondary | ICD-10-CM | POA: Diagnosis not present

## 2015-09-10 DIAGNOSIS — N390 Urinary tract infection, site not specified: Secondary | ICD-10-CM | POA: Diagnosis not present

## 2015-09-14 ENCOUNTER — Other Ambulatory Visit: Payer: Medicare Other

## 2015-09-14 ENCOUNTER — Ambulatory Visit: Payer: Medicare Other | Admitting: Family

## 2015-09-15 ENCOUNTER — Ambulatory Visit (HOSPITAL_COMMUNITY)
Admission: RE | Admit: 2015-09-15 | Discharge: 2015-09-15 | Disposition: A | Discharge status: Home / Self Care | Payer: Medicare Other | Source: Ambulatory Visit | Attending: Hematology & Oncology | Admitting: Hematology & Oncology

## 2015-09-15 DIAGNOSIS — D4620 Refractory anemia with excess of blasts, unspecified: Secondary | ICD-10-CM

## 2015-09-16 ENCOUNTER — Telehealth: Payer: Self-pay | Admitting: *Deleted

## 2015-09-16 ENCOUNTER — Ambulatory Visit (HOSPITAL_BASED_OUTPATIENT_CLINIC_OR_DEPARTMENT_OTHER): Payer: Medicare Other

## 2015-09-16 ENCOUNTER — Other Ambulatory Visit (HOSPITAL_BASED_OUTPATIENT_CLINIC_OR_DEPARTMENT_OTHER): Payer: Medicare Other

## 2015-09-16 ENCOUNTER — Ambulatory Visit (HOSPITAL_BASED_OUTPATIENT_CLINIC_OR_DEPARTMENT_OTHER): Payer: Medicare Other | Admitting: Family

## 2015-09-16 VITALS — BP 102/54 | HR 67 | Temp 97.9°F | Resp 16

## 2015-09-16 VITALS — BP 89/36 | HR 92 | Temp 97.3°F | Resp 18 | Ht 61.0 in | Wt 107.8 lb

## 2015-09-16 DIAGNOSIS — D460 Refractory anemia without ring sideroblasts, so stated: Secondary | ICD-10-CM | POA: Diagnosis not present

## 2015-09-16 DIAGNOSIS — D4620 Refractory anemia with excess of blasts, unspecified: Secondary | ICD-10-CM

## 2015-09-16 DIAGNOSIS — D464 Refractory anemia, unspecified: Secondary | ICD-10-CM

## 2015-09-16 DIAGNOSIS — D649 Anemia, unspecified: Secondary | ICD-10-CM

## 2015-09-16 LAB — MANUAL DIFFERENTIAL (CHCC SATELLITE)
ALC: 0.9 10*3/uL (ref 0.6–2.2)
ANC (CHCC HP manual diff): 3.3 10*3/uL (ref 1.5–6.7)
BASO: 1 % (ref 0–2)
Band Neutrophils: 2 % (ref 0–10)
Eos: 4 % (ref 0–7)
LYMPH: 18 % (ref 14–48)
MONO: 8 % (ref 0–13)
PLT EST ~~LOC~~: ADEQUATE
SEG: 67 % (ref 40–75)

## 2015-09-16 LAB — CBC WITH DIFFERENTIAL (CANCER CENTER ONLY)
HCT: 19.2 % — ABNORMAL LOW (ref 34.8–46.6)
HGB: 6.1 g/dL — CL (ref 11.6–15.9)
MCH: 29 pg (ref 26.0–34.0)
MCHC: 31.8 g/dL — AB (ref 32.0–36.0)
MCV: 91 fL (ref 81–101)
PLATELETS: 176 10*3/uL (ref 145–400)
RBC: 2.1 10*6/uL — ABNORMAL LOW (ref 3.70–5.32)
RDW: 26.7 % — AB (ref 11.1–15.7)
WBC: 4.8 10*3/uL (ref 3.9–10.0)

## 2015-09-16 LAB — COMPREHENSIVE METABOLIC PANEL
ALBUMIN: 2.7 g/dL — AB (ref 3.5–5.0)
ALK PHOS: 82 U/L (ref 40–150)
ALT: 32 U/L (ref 0–55)
ANION GAP: 9 meq/L (ref 3–11)
AST: 23 U/L (ref 5–34)
BUN: 26.8 mg/dL — AB (ref 7.0–26.0)
CALCIUM: 10.1 mg/dL (ref 8.4–10.4)
CO2: 28 mEq/L (ref 22–29)
Chloride: 101 mEq/L (ref 98–109)
Creatinine: 1 mg/dL (ref 0.6–1.1)
EGFR: 49 mL/min/{1.73_m2} — AB (ref >90)
Glucose: 155 mg/dl — ABNORMAL HIGH (ref 70–140)
POTASSIUM: 3.1 meq/L — AB (ref 3.5–5.1)
Sodium: 138 mEq/L (ref 136–145)
Total Bilirubin: 0.62 mg/dL (ref 0.20–1.20)
Total Protein: 6.1 g/dL — ABNORMAL LOW (ref 6.4–8.3)

## 2015-09-16 LAB — RETICULOCYTES
ABS Retic: 28 10*3/uL (ref 19.0–186.0)
RBC.: 2.15 MIL/uL — AB (ref 3.87–5.11)
RETIC CT PCT: 1.3 % (ref 0.4–2.3)

## 2015-09-16 LAB — PREPARE RBC (CROSSMATCH)

## 2015-09-16 LAB — HOLD TUBE, BLOOD BANK - CHCC SATELLITE

## 2015-09-16 MED ORDER — SODIUM CHLORIDE 0.9 % IV SOLN
250.0000 mL | Freq: Once | INTRAVENOUS | Status: AC
Start: 2015-09-16 — End: 2015-09-16
  Administered 2015-09-16: 250 mL via INTRAVENOUS

## 2015-09-16 MED ORDER — FUROSEMIDE 10 MG/ML IJ SOLN: 20.0000 mg | Freq: Once | INTRAMUSCULAR | Status: DC

## 2015-09-16 MED ORDER — SODIUM CHLORIDE 0.9 % IV SOLN
Freq: Once | INTRAVENOUS | Status: DC
  Administered 2015-09-16: 10:00:00 via INTRAVENOUS

## 2015-09-16 MED ORDER — ACETAMINOPHEN 325 MG PO TABS
ORAL_TABLET | ORAL | Status: AC
  Filled 2015-09-16: qty 2

## 2015-09-16 MED ORDER — DIPHENHYDRAMINE HCL 25 MG PO CAPS
ORAL_CAPSULE | ORAL | Status: AC
  Filled 2015-09-16: qty 1

## 2015-09-16 MED ORDER — DIPHENHYDRAMINE HCL 25 MG PO CAPS
25.0000 mg | ORAL_CAPSULE | Freq: Once | ORAL | Status: AC
  Administered 2015-09-16: 25 mg via ORAL

## 2015-09-16 MED ORDER — ACETAMINOPHEN 325 MG PO TABS
650.0000 mg | ORAL_TABLET | Freq: Once | ORAL | Status: AC
  Administered 2015-09-16: 650 mg via ORAL

## 2015-09-16 NOTE — Telephone Encounter (Signed)
Critical Value HGB 6.1 Laverna Peace NP notified. Orders placed

## 2015-09-16 NOTE — Patient Instructions (Signed)

## 2015-09-16 NOTE — Progress Notes (Signed)
Hematology and Oncology Follow Up Visit  Monica Riggs [id number] 22-May-1919 81 y.o. 09/16/2015   Principle Diagnosis:  Refractory anemia Myelodysplasia   Current Therapy:   Supportive care with transfusions    Interim History: Monica Riggs is here today with her daughter for labs and follow-up. She was hospitalized last week with a [place] lobe [place]. She is currently taking Levaquin and starting to feel a little better. She is coughing up clear sputum at times. Her lungs are clear on auscultation.  She has had no fever, n/v, rash, chest pain, palpitations, abdominal pain or changes in bowel or bladder habits. She has SOB with exertion.  She is still sleeping a great deal of the time. He daughter states that she is getting more forgetful. She is also quite HOH.  She is not eating much at home. She does drink 1 Boost a day. She is not drinking much fluid. Her weight is unchanged.  No episodes of bleeding or bruising.   No c/o tenderness, numbness or tingling in her extremities.   Medications:    Medication List       This list is accurate as of: 09/16/15  9:48 AM.  Always use your most recent med list.               aspirin EC 81 MG tablet  Take 81 mg by mouth daily.     brimonidine 0.2 % ophthalmic solution  Commonly known as:  ALPHAGAN  Place 1 drop into both eyes 2 (two) times daily.     cholecalciferol 1000 UNITS tablet  Commonly known as:  VITAMIN D  Take 1,000 Units by mouth daily.     diltiazem 240 MG 24 hr capsule  Commonly known as:  CARDIZEM CD  TAKE ONE (1) CAPSULE EACH DAY     folic acid [id number] MCG tablet  Commonly known as:  FOLVITE  Take 400 mcg by mouth 2 (two) times daily.     hydrochlorothiazide 25 MG tablet  Commonly known as:  HYDRODIURIL  TAKE 1 TABLET BY MOUTH EVERY DAY     levothyroxine 50 MCG tablet  Commonly known as:  SYNTHROID, LEVOTHROID  TAKE 1 TABLET EVERY MORNING     MUCINEX DM PO  Take by mouth.     naproxen  sodium 220 MG tablet  Commonly known as:  ANAPROX  Take 220 mg by mouth daily as needed. For pain     olmesartan 40 MG tablet  Commonly known as:  BENICAR  TAKE ONE (1) TABLET EACH DAY     polyethylene glycol packet  Commonly known as:  MIRALAX / GLYCOLAX  Take 17 g by mouth daily as needed for mild constipation.        Allergies:  Allergies  Allergen Reactions  . Sulfa Antibiotics Rash    Past Medical History, Surgical history, Social history, and Family History were reviewed and updated.  Review of Systems: All other 10 point review of systems is negative.   Physical Exam:  height is 5\' 1"  (1.549 m) and weight is 107 lb 12.8 oz (48.898 kg). Her oral temperature is 97.3 F (36.3 C). Her blood pressure is 89/36 and her pulse is 92. Her respiration is 18 and oxygen saturation is 95%.   Wt Readings from Last 3 Encounters:  09/16/15 107 lb 12.8 oz (48.898 kg)  08/17/15 107 lb (48.535 kg)  07/20/15 108 lb (48.988 kg)   Ocular: Sclerae unicteric, pupils equal, round and reactive  to light Ear-nose-throat: Oropharynx clear, dentition fair Lymphatic: No cervical supraclavicular or axillary adenopathy Lungs no rales or rhonchi, good excursion bilaterally Heart controlled atrial fib Abd soft, nontender, positive bowel sounds MSK no focal spinal tenderness, no joint edema Neuro: non-focal, well-oriented, appropriate affect Breasts: Deferred  Lab Results  Component Value Date   WBC 5.1 08/17/2015   HGB 6.1* 08/17/2015   HCT 19.3* 08/17/2015   MCV 96 08/17/2015   PLT 155 08/17/2015   Lab Results  Component Value Date   FERRITIN 3,053* 01/04/2015   IRON 169* 01/04/2015   TIBC 161* 01/04/2015   UIBC <1.0 01/04/2015   IRONPCTSAT >100 01/04/2015   Lab Results  Component Value Date   RETICCTPCT 1.0 08/17/2015   RBC 2.07* 08/17/2015   RETICCTABS 20.7 08/17/2015   No results found for: KPAFRELGTCHN, LAMBDASER, KAPLAMBRATIO No results found for: Kandis Cocking  [facility] Lab Results  Component Value Date   TOTALPROTELP 6.3 09/22/2012   ALBUMINELP 59.8 09/22/2012   A1GS 6.4* 09/22/2012   A2GS 10.7 09/22/2012   BETS 5.7 09/22/2012   BETA2SER 4.6 09/22/2012   GAMS 12.8 09/22/2012   MSPIKE NOT DET 09/22/2012   SPEI * 09/22/2012     Chemistry      Component Value Date/Time   NA 140 08/17/2015 0845   NA 135 06/23/2015 0858   NA 135 05/30/2015 1051   NA 139 01/11/2015 1614   K 4.2 08/17/2015 0845   K 3.6 06/23/2015 0858   K 4.0 05/30/2015 1051   CL 101 06/23/2015 0858   CL 97* 05/30/2015 1051   CO2 30* 08/17/2015 0845   CO2 26 06/23/2015 0858   CO2 29 05/30/2015 1051   BUN 44.5* 08/17/2015 0845   BUN 27* 06/23/2015 0858   BUN 17 05/30/2015 1051   BUN 14 01/11/2015 1614   CREATININE 1.2* 08/17/2015 0845   CREATININE 1.17* 06/23/2015 0858   CREATININE 1.0 05/30/2015 1051      Component Value Date/Time   CALCIUM 10.1 08/17/2015 0845   CALCIUM 9.1 06/23/2015 0858   CALCIUM 9.9 05/30/2015 1051   ALKPHOS 96 08/17/2015 0845   ALKPHOS 88 06/23/2015 0858   ALKPHOS 74 05/30/2015 1051   AST 21 08/17/2015 0845   AST 27 06/23/2015 0858   AST 29 05/30/2015 1051   ALT 29 08/17/2015 0845   ALT 37* 06/23/2015 0858   ALT 35 05/30/2015 1051   BILITOT 0.39 08/17/2015 0845   BILITOT 0.5 06/23/2015 0858   BILITOT 0.80 05/30/2015 1051   BILITOT 0.8 01/11/2015 1614     Impression and Plan: Monica Riggs is a 81 year old white female with myelodysplasia and refractory anemia. She was treated in the hospital last week for a lower lobe pneumonia and is on Levaquin at home at this time.  She is weak and fatigued.  He last transfusion was on 11/2. Her Hgb today is 6.1.  We will give her 2 units of blood today. She follows up with Dr. Hassell Done (PCP) on Monday.  We will plan to see her back in 4 weeks for lab work and follow-up.  Both she and her daughter know to contact us with any questions or concerns. We can certainly see her sooner if need  be.  Eliezer Bottom, NP 12/2/20169:48 AM

## 2015-09-17 LAB — TYPE AND SCREEN
ABO/RH(D): O NEG
Antibody Screen: NEGATIVE
UNIT DIVISION: 0
Unit division: 0

## 2015-09-19 ENCOUNTER — Ambulatory Visit (INDEPENDENT_AMBULATORY_CARE_PROVIDER_SITE_OTHER): Payer: Medicare Other | Admitting: Physician Assistant

## 2015-09-19 ENCOUNTER — Encounter: Payer: Self-pay | Admitting: Physician Assistant

## 2015-09-19 VITALS — BP 106/70 | HR 100 | Temp 97.7°F | Ht 61.0 in | Wt 110.0 lb

## 2015-09-19 DIAGNOSIS — R413 Other amnesia: Secondary | ICD-10-CM

## 2015-09-19 DIAGNOSIS — I10 Essential (primary) hypertension: Secondary | ICD-10-CM

## 2015-09-19 DIAGNOSIS — E039 Hypothyroidism, unspecified: Secondary | ICD-10-CM | POA: Diagnosis not present

## 2015-09-19 DIAGNOSIS — I4891 Unspecified atrial fibrillation: Secondary | ICD-10-CM | POA: Diagnosis not present

## 2015-09-19 DIAGNOSIS — D6189 Other specified aplastic anemias and other bone marrow failure syndromes: Secondary | ICD-10-CM

## 2015-09-19 MED ORDER — DONEPEZIL HCL 5 MG PO TABS: 5.0000 mg | ORAL_TABLET | Freq: Every day | ORAL | Status: AC

## 2015-09-19 NOTE — Assessment & Plan Note (Signed)
Strong family history of dementia. Will begin Aricept 5 mg daily. Follow-up 1 month.

## 2015-09-19 NOTE — Progress Notes (Signed)
Pre visit review using our clinic review tool, if applicable. No additional management support is needed unless otherwise documented below in the visit note. 

## 2015-09-19 NOTE — Assessment & Plan Note (Signed)
Rate-controlled. Asymptomatic. Continue chronic regimen. Follow-up with Cardiology.

## 2015-09-19 NOTE — Progress Notes (Signed)
Patient presents to clinic today to establish care.  Acute Concerns: Patient and caregiver note a decline in memory over the past year. Patient endorses forgetting where she is sometimes. Caregiver (daughter) states patient had a few instances where she left the oven/stove on. Is now living with them. Patient denies getting lost. Problem is mainly with recent memory but there is some long-term memory loss noted.   Chronic Issues: Myelodysplastic Syndrome -- Followed by Hematology/Oncology Myna Hidalgo). Last appointment 09/16/2015. Doing well overall. Is receiving transfusions for anemia every 4-6 weeks.  Hypertension -- with chronic A. Fib. Is taking Cardizem, Benicar and HCTZ as directed. Patient denies chest pain, palpitations, lightheadedness, dizziness, vision changes or frequent headaches.  BP Readings from Last 3 Encounters:  09/19/15 106/70  09/16/15 102/54  09/16/15 89/36   Hypothyroidism -- taking 50 mcg levothyroxine daily as directed. Endorses TSH recently checked by previous PCP and was normal. Will obtain records.  Past Medical History  Diagnosis Date  . Hyperlipidemia   . Hypertension   . Myelodysplasia   . Arthritis   . Chronic atrial fibrillation (HCC)   . Left knee injury   . Blood transfusion without reported diagnosis     Past Surgical History  Procedure Laterality Date  . Abdominal hysterectomy    . Tonsillectomy      Current Outpatient Prescriptions on File Prior to Visit  Medication Sig Dispense Refill  . aspirin EC 81 MG tablet Take 81 mg by mouth daily.    . brimonidine (ALPHAGAN) 0.2 % ophthalmic solution Place 1 drop into both eyes 2 (two) times daily.    . cholecalciferol (VITAMIN D) 1000 UNITS tablet Take 1,000 Units by mouth daily.    Marland Kitchen Dextromethorphan-Guaifenesin (MUCINEX DM PO) Take by mouth.    . diltiazem (CARDIZEM CD) 240 MG 24 hr capsule TAKE ONE (1) CAPSULE EACH DAY 30 capsule 1  . folic acid (FOLVITE) 400 MCG tablet Take 400 mcg by  mouth 2 (two) times daily.    . hydrochlorothiazide (HYDRODIURIL) 25 MG tablet TAKE 1 TABLET BY MOUTH EVERY DAY 30 tablet 0  . levothyroxine (SYNTHROID, LEVOTHROID) 50 MCG tablet TAKE 1 TABLET EVERY MORNING 30 tablet 4  . naproxen sodium (ANAPROX) 220 MG tablet Take 220 mg by mouth daily as needed. For pain    . olmesartan (BENICAR) 40 MG tablet TAKE ONE (1) TABLET EACH DAY (Patient taking differently: TAKE ONE 1/2  TABLET EACH DAY) 30 tablet 2  . polyethylene glycol (MIRALAX / GLYCOLAX) packet Take 17 g by mouth daily as needed for mild constipation.     No current facility-administered medications on file prior to visit.    Allergies  Allergen Reactions  . Sulfa Antibiotics Rash    Family History  Problem Relation Age of Onset  . Stroke      Parents  . Breast cancer      Female members on the maternal side  . Breast cancer      Father    Social History   Social History  . Marital Status: Widowed    Spouse Name: N/A  . Number of Children: N/A  . Years of Education: N/A   Occupational History  . Not on file.   Social History Main Topics  . Smoking status: Never Smoker   . Smokeless tobacco: Never Used     Comment: never used tobacco  . Alcohol Use: No  . Drug Use: No  . Sexual Activity: Not on file   Other Topics  Concern  . Not on file   Social History Narrative   Lives currently alone in [place]   Never smoker never drinker   Used to work in Designer, fashion/clothing and in a factory   Has 2 twins, 5 grandchildren, 8 great-grandchildren    Review of Systems  Constitutional: Negative for fever and weight loss.  HENT: Negative for ear discharge, ear pain, hearing loss and tinnitus.   Eyes: Negative for blurred vision, double vision, photophobia and pain.  Respiratory: Negative for cough and shortness of breath.   Cardiovascular: Negative for chest pain and palpitations.  Gastrointestinal: Negative for heartburn, nausea, vomiting, abdominal pain, diarrhea, constipation,  blood in stool and melena.  Genitourinary: Negative for dysuria, urgency, frequency, hematuria and flank pain.  Musculoskeletal: Negative for falls.  Neurological: Negative for dizziness, loss of consciousness and headaches.  Endo/Heme/Allergies: Negative for environmental allergies.  Psychiatric/Behavioral: Positive for memory loss. Negative for depression, suicidal ideas, hallucinations and substance abuse. The patient is not nervous/anxious and does not have insomnia.     BP 106/70 mmHg  Pulse 100  Temp(Src) 97.7 F (36.5 C) (Oral)  Ht 5\' 1"  (1.549 m)  Wt 110 lb (49.896 kg)  BMI 20.80 kg/m2  SpO2 98%  Physical Exam  Constitutional: She is oriented to person, place, and time and well-developed, well-nourished, and in no distress.  HENT:  Head: Normocephalic and atraumatic.  Cardiovascular: Normal heart sounds and intact distal pulses.   Chronic a. Fib rhythm auscultated. Asymptomatic.  Pulmonary/Chest: Effort normal and breath sounds normal. No respiratory distress. She has no wheezes. She has no rales. She exhibits no tenderness.  Neurological: She is alert and oriented to person, place, and time.  Skin: Skin is warm and dry. No rash noted.  Psychiatric: Affect normal.  Vitals reviewed.   Recent Results (from the past 2160 hour(s))  Hold Tube, Blood Bank     Status: None   Collection Time: 06/23/15  8:58 AM  Result Value Ref Range   Hold Tube, Blood Bank Type and Crossmatch Added   CBC with Differential [facility])     Status: Abnormal   Collection Time: 06/23/15  8:58 AM  Result Value Ref Range   WBC 1.9 (L) 3.9 - 10.0 10e3/uL   RBC 1.70 (L) 3.70 - 5.32 10e6/uL   HGB 5.2 (LL) 11.6 - 15.9 g/dL   HCT 08/23/15 (L) 62.4 - 42.4 %   MCV 97 81 - 101 fL   MCH 30.6 26.0 - 34.0 pg   MCHC 31.7 (L) 32.0 - 36.0 g/dL   RDW 58.2 (H) 00.3 - 43.4 %   Platelets 164 145 - 400 10e3/uL  Manual Differential ([facility])     Status: Abnormal   Collection Time: 06/23/15  8:58 AM    Result Value Ref Range   ANC ([facility] HP manual diff) 1.0 (L) 1.5 - 6.7 10e3/uL   ALC 0.7 0.6 - 2.2 10e3/uL   SEG 47 40 - 75 %   Band Neutrophils 5 0 - 10 %   LYMPH 38 14 - 48 %   MONO 2 0 - 13 %   Eos 4 0 - 7 %   BASO 4 (H) 0 - 2 %   Polychromasia Slight Slight   Ovalocyte Few Negative   Shistocyte Few Negative   PLT EST [place] Adequate Adequate   Platelet Morphology Large Platelets Within Normal Limits  Comprehensive metabolic panel     Status: Abnormal   Collection Time: 06/23/15  8:58 AM  Result Value Ref Range   Sodium 135 135 - 146 mmol/L   Potassium 3.6 3.5 - 5.3 mmol/L   Chloride 101 98 - 110 mmol/L   CO2 26 20 - 31 mmol/L   Glucose, Bld 140 (H) 65 - 99 mg/dL   BUN 27 (H) 7 - 25 mg/dL   Creatinine, Ser 5.01 (H) 0.60 - 0.88 mg/dL   Total Bilirubin 0.5 0.2 - 1.2 mg/dL   Alkaline Phosphatase 88 33 - 130 U/L   AST 27 10 - 35 U/L   ALT 37 (H) 6 - 29 U/L   Total Protein 5.5 (L) 6.1 - 8.1 g/dL   Albumin 2.9 (L) 3.6 - 5.1 g/dL   Calcium 9.1 8.6 - 15.6 mg/dL    Comment: ** Please note change in unit of measure and reference range(s). **   Reticulocyte Count (SLN)     Status: Abnormal   Collection Time: 06/23/15  8:58 AM  Result Value Ref Range   Retic Ct Pct 1.3 0.4 - 2.3 %   RBC. 1.71 (L) 3.87 - 5.11 MIL/uL   ABS Retic 22.2 19.0 - 186.0 K/uL  Type and screen     Status: None   Collection Time: 06/23/15 11:01 AM  Result Value Ref Range   ABO/RH(D) O NEG    Antibody Screen NEG    Sample Expiration 06/26/2015    Unit Number [id number]    Blood Component Type RBC LR PHER2    Unit division 00    Status of Unit ISSUED,FINAL    Transfusion Status OK TO TRANSFUSE    Crossmatch Result Compatible    Unit Number [id number]    Blood Component Type RBC LR PHER1    Unit division 00    Status of Unit ISSUED,FINAL    Transfusion Status OK TO TRANSFUSE    Crossmatch Result Compatible   Prepare RBC     Status: None   Collection Time: 06/23/15 11:30 AM  Result Value Ref Range    Order Confirmation ORDER PROCESSED BY BLOOD BANK   CBC with Differential ([facility])     Status: Abnormal   Collection Time: 07/20/15  8:41 AM  Result Value Ref Range   WBC 3.2 (L) 3.9 - 10.0 10e3/uL   RBC 1.81 (L) 3.70 - 5.32 10e6/uL   HGB 5.2 (LL) 11.6 - 15.9 g/dL   HCT 16.0 (L) 76.0 - 66.7 %   MCV 90 81 - 101 fL   MCH 28.7 26.0 - 34.0 pg   MCHC 31.9 (L) 32.0 - 36.0 g/dL   RDW 85.5 (H) 47.6 - 89.1 %   Platelets 174 145 - 400 10e3/uL  Hold Tube, Blood Bank - [facility]     Status: None   Collection Time: 07/20/15  8:41 AM  Result Value Ref Range   Hold Tube,Blood Bank [place] CROSSMATCH ADDED   Manual Differential ([facility])     Status: Abnormal   Collection Time: 07/20/15  8:41 AM  Result Value Ref Range   ANC ([facility] HP manual diff) 2.2 1.5 - 6.7 10e3/uL   ALC 0.8 0.6 - 2.2 10e3/uL   SEG 64 40 - 75 %   Band Neutrophils 4 0 - 10 %   LYMPH 24 14 - 48 %   Eos 6 0 - 7 %   Metamyelocytes 2 (H) 0 - 0 %   Ovalocyte Few Negative   Shistocyte Few Negative   Burr Cells Few Negative   PLT EST [place] Adequate Adequate  Platelet Morphology Large Platelets Within Normal Limits  Comprehensive metabolic panel     Status: Abnormal   Collection Time: 07/20/15  8:42 AM  Result Value Ref Range   Sodium 137 136 - 145 mEq/L   Potassium 4.0 3.5 - 5.1 mEq/L   Chloride 102 98 - 109 mEq/L   CO2 30 (H) 22 - 29 mEq/L   Glucose 102 70 - 140 mg/dl    Comment: Glucose reference range is for nonfasting patients. Fasting glucose reference range is 70- 100.   BUN 27.8 (H) 7.0 - 26.0 mg/dL   Creatinine 1.0 0.6 - 1.1 mg/dL   Total Bilirubin 0.63 0.20 - 1.20 mg/dL   Alkaline Phosphatase 85 40 - 150 U/L   AST 28 5 - 34 U/L   ALT 48 0 - 55 U/L   Total Protein 5.6 (L) 6.4 - 8.3 g/dL   Albumin 2.8 (L) 3.5 - 5.0 g/dL   Calcium 9.4 8.4 - 10.4 mg/dL   Anion Gap 5 3 - 11 mEq/L   EGFR 51 (L) >90 ml/min/1.73 m2    Comment: eGFR is calculated using the CKD-EPI Creatinine Equation (2009)  Reticulocyte  Count (SLN)     Status: Abnormal   Collection Time: 07/20/15  8:42 AM  Result Value Ref Range   Retic Ct Pct 1.1 0.4 - 2.3 %   RBC. 1.87 (L) 3.87 - 5.11 MIL/uL   ABS Retic 20.6 19.0 - 186.0 K/uL  Prepare RBC     Status: None   Collection Time: 07/20/15  8:43 AM  Result Value Ref Range   Order Confirmation ORDER PROCESSED BY BLOOD BANK   Type and screen     Status: None   Collection Time: 07/20/15  8:43 AM  Result Value Ref Range   ABO/RH(D) O NEG    Antibody Screen NEG    Sample Expiration 07/23/2015    Unit Number [id number]    Blood Component Type RED CELLS,LR    Unit division 00    Status of Unit ISSUED,FINAL    Transfusion Status OK TO TRANSFUSE    Crossmatch Result Compatible    Unit Number [id number]    Blood Component Type RED CELLS,LR    Unit division 00    Status of Unit ISSUED,FINAL    Transfusion Status OK TO TRANSFUSE    Crossmatch Result Compatible   CBC with Differential [facility])     Status: Abnormal   Collection Time: 08/17/15  8:45 AM  Result Value Ref Range   WBC 5.1 3.9 - 10.0 10e3/uL   RBC 2.02 (L) 3.70 - 5.32 10e6/uL   HGB 6.1 (LL) 11.6 - 15.9 g/dL   HCT 19.3 (L) 34.8 - 46.6 %   MCV 96 81 - 101 fL   MCH 30.2 26.0 - 34.0 pg   MCHC 31.6 (L) 32.0 - 36.0 g/dL   RDW 22.4 (H) 11.1 - 15.7 %   Platelets 155 145 - 400 10e3/uL  Hold Tube, Blood Bank - [facility]     Status: None   Collection Time: 08/17/15  8:45 AM  Result Value Ref Range   Hold Tube,Blood Bank [place] CROSSMATCH ADDED   Manual Differential ([facility])     Status: Abnormal   Collection Time: 08/17/15  8:45 AM  Result Value Ref Range   ANC ([facility] HP manual diff) 3.8 1.5 - 6.7 10e3/uL   ALC 1.0 0.6 - 2.2 10e3/uL   SEG 68 40 - 75 %   Band Neutrophils 6 0 -  10 %   LYMPH 19 14 - 48 %   MONO 2 0 - 13 %   Eos 4 0 - 7 %   Myelocytes 1 (H) 0 - 0 %   Polychromasia Slight Slight   Tear Drop Cell Few Negative   Ovalocyte Few Negative   PLT EST [place] Decreased Adequate   Platelet  Morphology Within Normal Limits Within Normal Limits  Comprehensive metabolic panel     Status: Abnormal   Collection Time: 08/17/15  8:45 AM  Result Value Ref Range   Sodium 140 136 - 145 mEq/L   Potassium 4.2 3.5 - 5.1 mEq/L   Chloride 105 98 - 109 mEq/L   CO2 30 (H) 22 - 29 mEq/L   Glucose 153 (H) 70 - 140 mg/dl    Comment: Glucose reference range is for nonfasting patients. Fasting glucose reference range is 70- 100.   BUN 44.5 (H) 7.0 - 26.0 mg/dL   Creatinine 1.2 (H) 0.6 - 1.1 mg/dL   Total Bilirubin 0.39 0.20 - 1.20 mg/dL   Alkaline Phosphatase 96 40 - 150 U/L   AST 21 5 - 34 U/L   ALT 29 0 - 55 U/L   Total Protein 6.1 (L) 6.4 - 8.3 g/dL   Albumin 2.9 (L) 3.5 - 5.0 g/dL   Calcium 10.1 8.4 - 10.4 mg/dL   Anion Gap 5 3 - 11 mEq/L   EGFR 37 (L) >90 ml/min/1.73 m2    Comment: eGFR is calculated using the CKD-EPI Creatinine Equation (2009)  Reticulocyte Count (SLN)     Status: Abnormal   Collection Time: 08/17/15  8:45 AM  Result Value Ref Range   Retic Ct Pct 1.0 0.4 - 2.3 %   RBC. 2.07 (L) 3.87 - 5.11 MIL/uL   ABS Retic 20.7 19.0 - 186.0 K/uL  Type and screen     Status: None   Collection Time: 08/17/15  8:50 AM  Result Value Ref Range   ABO/RH(D) O NEG    Antibody Screen NEG    Sample Expiration 08/20/2015    Unit Number [id number]    Blood Component Type RBC LR PHER1    Unit division 00    Status of Unit ISSUED,FINAL    Transfusion Status OK TO TRANSFUSE    Crossmatch Result Compatible    Unit Number [id number]    Blood Component Type RBC LR PHER2    Unit division 00    Status of Unit ISSUED,FINAL    Transfusion Status OK TO TRANSFUSE    Crossmatch Result Compatible   Prepare RBC     Status: None   Collection Time: 08/17/15  8:50 AM  Result Value Ref Range   Order Confirmation ORDER PROCESSED BY BLOOD BANK   CBC with Differential [facility])     Status: Abnormal   Collection Time: 09/16/15  8:57 AM  Result Value Ref Range   WBC 4.8 3.9 - 10.0  10e3/uL   RBC 2.10 (L) 3.70 - 5.32 10e6/uL   HGB 6.1 (LL) 11.6 - 15.9 g/dL   HCT 19.2 (L) 34.8 - 46.6 %   MCV 91 81 - 101 fL   MCH 29.0 26.0 - 34.0 pg   MCHC 31.8 (L) 32.0 - 36.0 g/dL   RDW 26.7 (H) 11.1 - 15.7 %   Platelets 176 145 - 400 10e3/uL  Hold Tube, Blood Bank - [facility]     Status: None   Collection Time: 09/16/15  8:57 AM  Result Value Ref Range  Hold Tube,Blood Bank [place] CROSSMATCH ADDED   Manual Differential ([facility])     Status: None   Collection Time: 09/16/15  8:57 AM  Result Value Ref Range   ANC ([facility] manual diff) 3.3 1.5 - 6.7 10e3/uL   ALC 0.9 0.6 - 2.2 10e3/uL   SEG 67 40 - 75 %   Band Neutrophils 2 0 - 10 %   LYMPH 18 14 - 48 %   MONO 8 0 - 13 %   Eos 4 0 - 7 %   BASO 1 0 - 2 %   Polychromasia Slight Slight   Ovalocyte Few Negative   Burr Cells Few Negative   PLT EST [place] Adequate Adequate   Platelet Morphology Large Platelets Within Normal Limits  Reticulocytes     Status: Abnormal   Collection Time: 09/16/15  8:57 AM  Result Value Ref Range   Retic Ct Pct 1.3 0.4 - 2.3 %   RBC. 2.15 (L) 3.87 - 5.11 MIL/uL   ABS Retic 28.0 19.0 - 186.0 K/uL  Comprehensive metabolic panel     Status: Abnormal   Collection Time: 09/16/15  8:58 AM  Result Value Ref Range   Sodium 138 136 - 145 mEq/L   Potassium 3.1 (L) 3.5 - 5.1 mEq/L   Chloride 101 98 - 109 mEq/L   CO2 28 22 - 29 mEq/L   Glucose 155 (H) 70 - 140 mg/dl    Comment: Glucose reference range is for nonfasting patients. Fasting glucose reference range is 70- 100.   BUN 26.8 (H) 7.0 - 26.0 mg/dL   Creatinine 1.0 0.6 - 1.1 mg/dL   Total Bilirubin 0.62 0.20 - 1.20 mg/dL   Alkaline Phosphatase 82 40 - 150 U/L   AST 23 5 - 34 U/L   ALT 32 0 - 55 U/L   Total Protein 6.1 (L) 6.4 - 8.3 g/dL   Albumin 2.7 (L) 3.5 - 5.0 g/dL   Calcium 10.1 8.4 - 10.4 mg/dL   Anion Gap 9 3 - 11 mEq/L   EGFR 49 (L) >90 ml/min/1.73 m2    Comment: eGFR is calculated using the CKD-EPI Creatinine Equation (2009)    Type and screen     Status: None   Collection Time: 09/16/15  9:06 AM  Result Value Ref Range   ABO/RH(D) O NEG    Antibody Screen NEG    Sample Expiration 09/19/2015    Unit Number [id number]    Blood Component Type RED CELLS,LR    Unit division 00    Status of Unit ISSUED,FINAL    Transfusion Status OK TO TRANSFUSE    Crossmatch Result Compatible    Unit Number [id number]    Blood Component Type RED CELLS,LR    Unit division 00    Status of Unit ISSUED,FINAL    Transfusion Status OK TO TRANSFUSE    Crossmatch Result Compatible   Prepare RBC     Status: None   Collection Time: 09/16/15  9:06 AM  Result Value Ref Range   Order Confirmation ORDER PROCESSED BY BLOOD BANK     Assessment/Plan: Atrial fibrillation, controlled Rate-controlled. Asymptomatic. Continue chronic regimen. Follow-up with Cardiology.  Hypertension BP stable. Asymptomatic. Continue current regimen. Will monitor at each visit.  Hypothyroid Continue current regimen. Will obtain records.  Memory loss Strong family history of dementia. Will begin Aricept 5 mg daily. Follow-up 1 month.

## 2015-09-19 NOTE — Assessment & Plan Note (Signed)
BP stable. Asymptomatic. Continue current regimen. Will monitor at each visit.

## 2015-09-19 NOTE — Patient Instructions (Signed)
Please finish the antibiotic given. [name] lungs sound great. Infection is clinically resolved. Continue Mucinex and chronic medications.  Please start the Aricept taking daily. I want to see her in 1 month for follow-up.  Follow-up with Dr. Marin Olp as scheduled.

## 2015-09-19 NOTE — Assessment & Plan Note (Signed)
Continue current regimen. Will obtain records.

## 2015-09-27 ENCOUNTER — Encounter: Payer: Self-pay | Admitting: Hematology & Oncology

## 2015-09-28 ENCOUNTER — Telehealth: Payer: Self-pay | Admitting: Physician Assistant

## 2015-09-28 NOTE — Telephone Encounter (Signed)
Caller name: Erasmo Downer  Relationship to patient: Granddaughter  Can be reached: [phone]  Pharmacy: Would like to get it from [place]  Reason for call: Granddaughter called to request a rx for a walker. States that patient has a cane in the house but when they go out she gets tired and they would like to have a walker with a seat.

## 2015-09-29 NOTE — Telephone Encounter (Signed)
We can certainly write a prescription for the walker. I can handwrite this tomorrow when I am back on office. The trick part now-a-days is Medicare may require she have a face-to-face visit discussing need for walker to pay for this. I think this should be covered by her recent visit with me as we discussed weakness. I will work on this first thing in the morning.

## 2015-09-29 NOTE — Telephone Encounter (Signed)
Please advise.//AB/CMA 

## 2015-09-30 NOTE — Telephone Encounter (Signed)
Called and spoke with the pt's granddaughter and informed her of the note below.  Pt verbalized understanding.  Pt stated that she has seen some decline in the pt this week.  She stated that they will wait and see how the pt does before bring her in.//AB/CMA

## 2015-09-30 NOTE — Telephone Encounter (Signed)
I looked at the entire note and we mainly discussed her chronic medical issues and memory loss. Unfortunately I will need to see them in office for a face-to-face visit (required by Medicare) so that they will pay for the walker. Otherwise the insurance will not pay.

## 2015-10-04 ENCOUNTER — Other Ambulatory Visit: Payer: Self-pay | Admitting: Family Medicine

## 2015-10-04 NOTE — Telephone Encounter (Signed)
last seen 01/11/15  Dr Laurance Flatten

## 2015-10-18 ENCOUNTER — Ambulatory Visit (HOSPITAL_COMMUNITY)
Admission: RE | Admit: 2015-10-18 | Discharge: 2015-10-18 | Disposition: A | Discharge status: Home / Self Care | Payer: Medicare Other | Source: Ambulatory Visit | Attending: Hematology & Oncology | Admitting: Hematology & Oncology

## 2015-10-18 DIAGNOSIS — D4620 Refractory anemia with excess of blasts, unspecified: Secondary | ICD-10-CM | POA: Insufficient documentation

## 2015-10-18 DIAGNOSIS — D464 Refractory anemia, unspecified: Secondary | ICD-10-CM

## 2015-10-18 DIAGNOSIS — D46Z Other myelodysplastic syndromes: Secondary | ICD-10-CM

## 2015-10-21 ENCOUNTER — Encounter: Payer: Self-pay | Admitting: Hematology & Oncology

## 2015-10-21 ENCOUNTER — Telehealth: Payer: Self-pay | Admitting: *Deleted

## 2015-10-21 ENCOUNTER — Other Ambulatory Visit (HOSPITAL_BASED_OUTPATIENT_CLINIC_OR_DEPARTMENT_OTHER): Payer: Medicare Other

## 2015-10-21 ENCOUNTER — Ambulatory Visit (HOSPITAL_BASED_OUTPATIENT_CLINIC_OR_DEPARTMENT_OTHER): Payer: Medicare Other

## 2015-10-21 ENCOUNTER — Other Ambulatory Visit: Payer: Self-pay | Admitting: *Deleted

## 2015-10-21 ENCOUNTER — Ambulatory Visit (HOSPITAL_BASED_OUTPATIENT_CLINIC_OR_DEPARTMENT_OTHER): Payer: Medicare Other | Admitting: Hematology & Oncology

## 2015-10-21 VITALS — BP 79/36 | HR 47 | Resp 14 | Ht 61.0 in | Wt 100.0 lb

## 2015-10-21 VITALS — BP 82/46 | HR 52 | Temp 98.2°F | Resp 14

## 2015-10-21 DIAGNOSIS — D46Z Other myelodysplastic syndromes: Secondary | ICD-10-CM

## 2015-10-21 DIAGNOSIS — D4620 Refractory anemia with excess of blasts, unspecified: Secondary | ICD-10-CM

## 2015-10-21 DIAGNOSIS — D464 Refractory anemia, unspecified: Secondary | ICD-10-CM

## 2015-10-21 LAB — MANUAL DIFFERENTIAL (CHCC SATELLITE)
ALC: 0.8 10*3/uL (ref 0.6–2.2)
ANC (CHCC MAN DIFF): 2.6 10*3/uL (ref 1.5–6.7)
Band Neutrophils: 4 % (ref 0–10)
EOS: 5 % (ref 0–7)
LYMPH: 20 % (ref 14–48)
MONO: 7 % (ref 0–13)
PLT EST ~~LOC~~: ADEQUATE
SEG: 64 % (ref 40–75)

## 2015-10-21 LAB — COMPREHENSIVE METABOLIC PANEL
ALBUMIN: 2.9 g/dL — AB (ref 3.5–5.0)
ALK PHOS: 94 U/L (ref 40–150)
ALT: 62 U/L — AB (ref 0–55)
AST: 39 U/L — AB (ref 5–34)
Anion Gap: 10 mEq/L (ref 3–11)
BUN: 25.7 mg/dL (ref 7.0–26.0)
CO2: 26 mEq/L (ref 22–29)
CREATININE: 1.1 mg/dL (ref 0.6–1.1)
Calcium: 10.2 mg/dL (ref 8.4–10.4)
Chloride: 100 mEq/L (ref 98–109)
EGFR: 41 mL/min/{1.73_m2} — ABNORMAL LOW (ref >90)
GLUCOSE: 161 mg/dL — AB (ref 70–140)
POTASSIUM: 3.4 meq/L — AB (ref 3.5–5.1)
SODIUM: 136 meq/L (ref 136–145)
Total Bilirubin: 0.73 mg/dL (ref 0.20–1.20)
Total Protein: 6.4 g/dL (ref 6.4–8.3)

## 2015-10-21 LAB — CBC WITH DIFFERENTIAL (CANCER CENTER ONLY)
HEMATOCRIT: 16.9 % — AB (ref 34.8–46.6)
HEMOGLOBIN: 5.5 g/dL — AB (ref 11.6–15.9)
MCH: 30.4 pg (ref 26.0–34.0)
MCHC: 32.5 g/dL (ref 32.0–36.0)
MCV: 93 fL (ref 81–101)
Platelets: 234 10*3/uL (ref 145–400)
RBC: 1.81 10*6/uL — AB (ref 3.70–5.32)
RDW: 23.1 % — AB (ref 11.1–15.7)
WBC: 3.8 10*3/uL — AB (ref 3.9–10.0)

## 2015-10-21 LAB — RETICULOCYTES
ABS Retic: 20.5 10*3/uL (ref 19.0–186.0)
RBC.: 1.86 MIL/uL — ABNORMAL LOW (ref 3.87–5.11)
Retic Ct Pct: 1.1 % (ref 0.4–2.3)

## 2015-10-21 LAB — PREPARE RBC (CROSSMATCH)

## 2015-10-21 LAB — HOLD TUBE, BLOOD BANK - CHCC SATELLITE

## 2015-10-21 MED ORDER — FUROSEMIDE 10 MG/ML IJ SOLN: 10.0000 mg | Freq: Once | INTRAMUSCULAR | Status: DC

## 2015-10-21 MED ORDER — SODIUM CHLORIDE 0.9 % IV SOLN
250.0000 mL | Freq: Once | INTRAVENOUS | Status: AC
  Administered 2015-10-21: 250 mL via INTRAVENOUS

## 2015-10-21 MED ORDER — ACETAMINOPHEN 325 MG PO TABS
650.0000 mg | ORAL_TABLET | Freq: Once | ORAL | Status: AC
  Administered 2015-10-21: 650 mg via ORAL

## 2015-10-21 MED ORDER — ACETAMINOPHEN 325 MG PO TABS
ORAL_TABLET | ORAL | Status: AC
  Filled 2015-10-21: qty 2

## 2015-10-21 NOTE — Telephone Encounter (Signed)
Critical Value HGB 5.5 Dr Ennever notified. No orders at this time 

## 2015-10-21 NOTE — Patient Instructions (Signed)

## 2015-10-21 NOTE — Progress Notes (Signed)
Hematology and Oncology Follow Up Visit  Monica Riggs [id number] 1918/12/18 82 y.o. 10/21/2015   Principle Diagnosis:  Refractory anemia  Current Therapy:   Supportive care with transfusions      Interim History:  Ms.  Riggs is back for followup.  Her decline is noticeable. She's lost weight. She is not eating much according to her daughter. Her weight is now down to 100 pounds. She sleeps most the time. She really is not eating.  Her daughter, unfortunately, had an accident while on a trip down to [place]. She fell while she was in an RV and fractured her right forearm, pelvis, and took stitches in her head.  Surprisingly,she still  was able to drive her mom down to see [place].    I really think that giving her further transfusions will probably be of minimal help. I think that a transfusion today might be the last one that we give her. Again, her quality of life I don't think is going to be improving that much with transfusion.   She is not hurting.    she's had no cough or shortness of breath. She's had no bleeding. She's had no chest pain.   Her overall, her performance status is ECOG 3.  Medications:  Current outpatient prescriptions:  .  aspirin EC 81 MG tablet, Take 81 mg by mouth daily., Disp: , Rfl:  .  brimonidine (ALPHAGAN) 0.2 % ophthalmic solution, Place 1 drop into both eyes 2 (two) times daily., Disp: , Rfl:  .  cholecalciferol (VITAMIN D) 1000 UNITS tablet, Take 1,000 Units by mouth daily., Disp: , Rfl:  .  Dextromethorphan-Guaifenesin (MUCINEX DM PO), Take by mouth., Disp: , Rfl:  .  diltiazem (CARDIZEM CD) 240 MG 24 hr capsule, TAKE ONE (1) CAPSULE EACH DAY, Disp: 30 capsule, Rfl: 1 .  donepezil (ARICEPT) 5 MG tablet, Take 1 tablet (5 mg total) by mouth at bedtime., Disp: 30 tablet, Rfl: 1 .  folic acid (FOLVITE) A999333 MCG tablet, Take 400 mcg by mouth 2 (two) times daily., Disp: , Rfl:  .  hydrochlorothiazide (HYDRODIURIL) 25 MG tablet, TAKE 1 TABLET BY MOUTH EVERY  DAY, Disp: 30 tablet, Rfl: 0 .  levothyroxine (SYNTHROID, LEVOTHROID) 50 MCG tablet, TAKE 1 TABLET EVERY MORNING, Disp: 30 tablet, Rfl: 4 .  naproxen sodium (ANAPROX) 220 MG tablet, Take 220 mg by mouth daily as needed. For pain, Disp: , Rfl:  .  olmesartan (BENICAR) 40 MG tablet, TAKE ONE (1) TABLET EACH DAY (Patient taking differently: TAKE ONE 1/2  TABLET EACH DAY), Disp: 30 tablet, Rfl: 2 .  polyethylene glycol (MIRALAX / GLYCOLAX) packet, Take 17 g by mouth daily as needed for mild constipation., Disp: , Rfl:   Allergies:  Allergies  Allergen Reactions  . Sulfa Antibiotics Rash    Past Medical History, Surgical history, Social history, and Family History were reviewed and updated.  Review of Systems: As above  Physical Exam:  height is 5\' 1"  (1.549 m) and weight is 100 lb (45.36 kg). Her blood pressure is 79/36 and her pulse is 47. Her respiration is 14.   Elderly, petite white female. She is somewhat pale. Lungs are clear. Cardiac exam irregular rate and rhythm consistent with atrial fibrillation. . She has a 2/6 systolic ejection murmur. Abdomen is soft. She has good bowel sounds. There is no fluid wave is no palpable liver or spleen . Back exam shows no tenderness over the spine ribs or hips. There is some slight kyphosis. Extremities  shows minimal edema in her lower legs. Skin exam shows no rashes or ecchymosis. Neurological exam is nonfocal. Her affect is a little bit flat  Lab Results  Component Value Date   WBC 3.8* 10/21/2015   HGB 5.5* 10/21/2015   HCT 16.9* 10/21/2015   MCV 93 10/21/2015   PLT 234 10/21/2015     Chemistry      Component Value Date/Time   NA 138 09/16/2015 0858   NA 135 06/23/2015 0858   NA 135 05/30/2015 1051   NA 139 01/11/2015 1614   K 3.1* 09/16/2015 0858   K 3.6 06/23/2015 0858   K 4.0 05/30/2015 1051   CL 101 06/23/2015 0858   CL 97* 05/30/2015 1051   CO2 28 09/16/2015 0858   CO2 26 06/23/2015 0858   CO2 29 05/30/2015 1051   BUN 26.8*  09/16/2015 0858   BUN 27* 06/23/2015 0858   BUN 17 05/30/2015 1051   BUN 14 01/11/2015 1614   CREATININE 1.0 09/16/2015 0858   CREATININE 1.17* 06/23/2015 0858   CREATININE 1.0 05/30/2015 1051      Component Value Date/Time   CALCIUM 10.1 09/16/2015 0858   CALCIUM 9.1 06/23/2015 0858   CALCIUM 9.9 05/30/2015 1051   ALKPHOS 82 09/16/2015 0858   ALKPHOS 88 06/23/2015 0858   ALKPHOS 74 05/30/2015 1051   AST 23 09/16/2015 0858   AST 27 06/23/2015 0858   AST 29 05/30/2015 1051   ALT 32 09/16/2015 0858   ALT 37* 06/23/2015 0858   ALT 35 05/30/2015 1051   BILITOT 0.62 09/16/2015 0858   BILITOT 0.5 06/23/2015 0858   BILITOT 0.80 05/30/2015 1051   BILITOT 0.8 01/11/2015 1614         Impression and Plan: Monica Riggs is a 82 year old white female. She  has myelodysplasia.   it is obvious that her decline is quite significant.   I really think this probably will be last transfusion that we give her. I'm not sure that we are really benefiting her quality of life. Hopefully, this will last transfusion will allow her to have some comfort.    I'm unsure really have to see her back. I think hospice is following her along.    she has done incredible he well. I just want to make sure that  She has comfort and dignity.  It has been a true pleasure to take care of her for all these years.  Volanda Napoleon, MD 1/6/201710:20 AM

## 2015-10-24 LAB — TYPE AND SCREEN
ABO/RH(D): O NEG
Antibody Screen: NEGATIVE
Unit division: 0
Unit division: 0

## 2015-10-25 ENCOUNTER — Telehealth: Payer: Self-pay | Admitting: Physician Assistant

## 2015-10-25 ENCOUNTER — Ambulatory Visit: Payer: Medicare Other | Admitting: Physician Assistant

## 2015-10-26 NOTE — Telephone Encounter (Signed)
No charge due to inclement weather

## 2015-10-26 NOTE — Telephone Encounter (Signed)
Pt was no show 10/25/15 10:15am for follow up appt, pt has not rescheduled, 1st no show, charge or no charge?

## 2015-10-28 ENCOUNTER — Telehealth: Payer: Self-pay | Admitting: Physician Assistant

## 2015-11-01 ENCOUNTER — Other Ambulatory Visit: Payer: Self-pay | Admitting: Emergency Medicine

## 2015-11-02 ENCOUNTER — Encounter: Payer: Self-pay | Admitting: Hematology & Oncology

## 2015-11-08 ENCOUNTER — Other Ambulatory Visit: Payer: Self-pay | Admitting: Family Medicine

## 2015-11-08 NOTE — Telephone Encounter (Signed)
Last seen 01/11/15 [facility] 

## 2015-11-14 ENCOUNTER — Other Ambulatory Visit: Payer: Self-pay | Admitting: Family Medicine

## 2015-11-14 NOTE — Telephone Encounter (Signed)
Last seen 01/11/15 [facility] 

## 2015-11-23 ENCOUNTER — Other Ambulatory Visit: Payer: Self-pay | Admitting: Family Medicine

## 2015-11-23 NOTE — Telephone Encounter (Signed)
Last seen 01/11/15 [facility] 

## 2015-12-03 ENCOUNTER — Other Ambulatory Visit: Payer: Self-pay | Admitting: Family Medicine

## 2015-12-05 ENCOUNTER — Telehealth: Payer: Self-pay

## 2015-12-05 ENCOUNTER — Other Ambulatory Visit: Payer: Self-pay | Admitting: Hematology & Oncology

## 2015-12-05 ENCOUNTER — Other Ambulatory Visit: Payer: Self-pay

## 2015-12-05 DIAGNOSIS — D469 Myelodysplastic syndrome, unspecified: Secondary | ICD-10-CM

## 2015-12-05 DIAGNOSIS — D464 Refractory anemia, unspecified: Secondary | ICD-10-CM

## 2015-12-05 NOTE — Telephone Encounter (Signed)
New patient referral made to [facility] with [facility] per Dr Marin Olp. dph

## 2015-12-09 ENCOUNTER — Other Ambulatory Visit: Payer: Medicare Other

## 2015-12-09 ENCOUNTER — Encounter: Payer: Self-pay | Admitting: Hematology & Oncology

## 2015-12-09 ENCOUNTER — Telehealth: Payer: Self-pay | Admitting: Hematology & Oncology

## 2015-12-09 NOTE — Telephone Encounter (Signed)
Patient's grand daughter called and stated that her grandmother would not be coming in this morning for treatment. I requested patient's DOB. Patient's grand daughter gave 03/15/1921. I advised her that the patient's name did not appear under that DOB. Patient's Grand daughter then stated "Try 03/14/1920". I advised her that the patient's name did not appear with that DOB either. I asked her was anyone with her who could identify the patient's accurate DOB. She stated that she was at work. Then patient's grand daughter asked me to look for the patient by name. I asked the patient's grand daughter if she is listed on patient's HIPAA/Release form. 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 You do not know what people are going through". I advised patient's grand daughter that I apologize if I seemed uncompassionate, but that we require an accurate DOB as identifying information. I advised patient's grandaughter that we would cancel her appts for today, Friday 12/09/2015.       [facility].

## 2015-12-19 ENCOUNTER — Telehealth: Payer: Self-pay | Admitting: *Deleted

## 2015-12-20 ENCOUNTER — Other Ambulatory Visit: Payer: Self-pay | Admitting: Family Medicine

## 2015-12-24 ENCOUNTER — Other Ambulatory Visit: Payer: Self-pay | Admitting: Physician Assistant

## 2015-12-24 ENCOUNTER — Other Ambulatory Visit: Payer: Self-pay | Admitting: Family Medicine

## 2016-01-14 NOTE — Telephone Encounter (Signed)
Received notification from hospice that patient passed away today at 5:50am.  Dr Marin Olp notified.

## 2016-01-14 DEATH — deceased

## 2016-02-15 IMAGING — DX DG CHEST 2V
2 series · 2 of 2 positions shown · non-contrast
Comparison: 03/08/2015

CLINICAL DATA: Cough, runny nose, right ear ache

EXAM:
CHEST  2 VIEW

[chest pa]
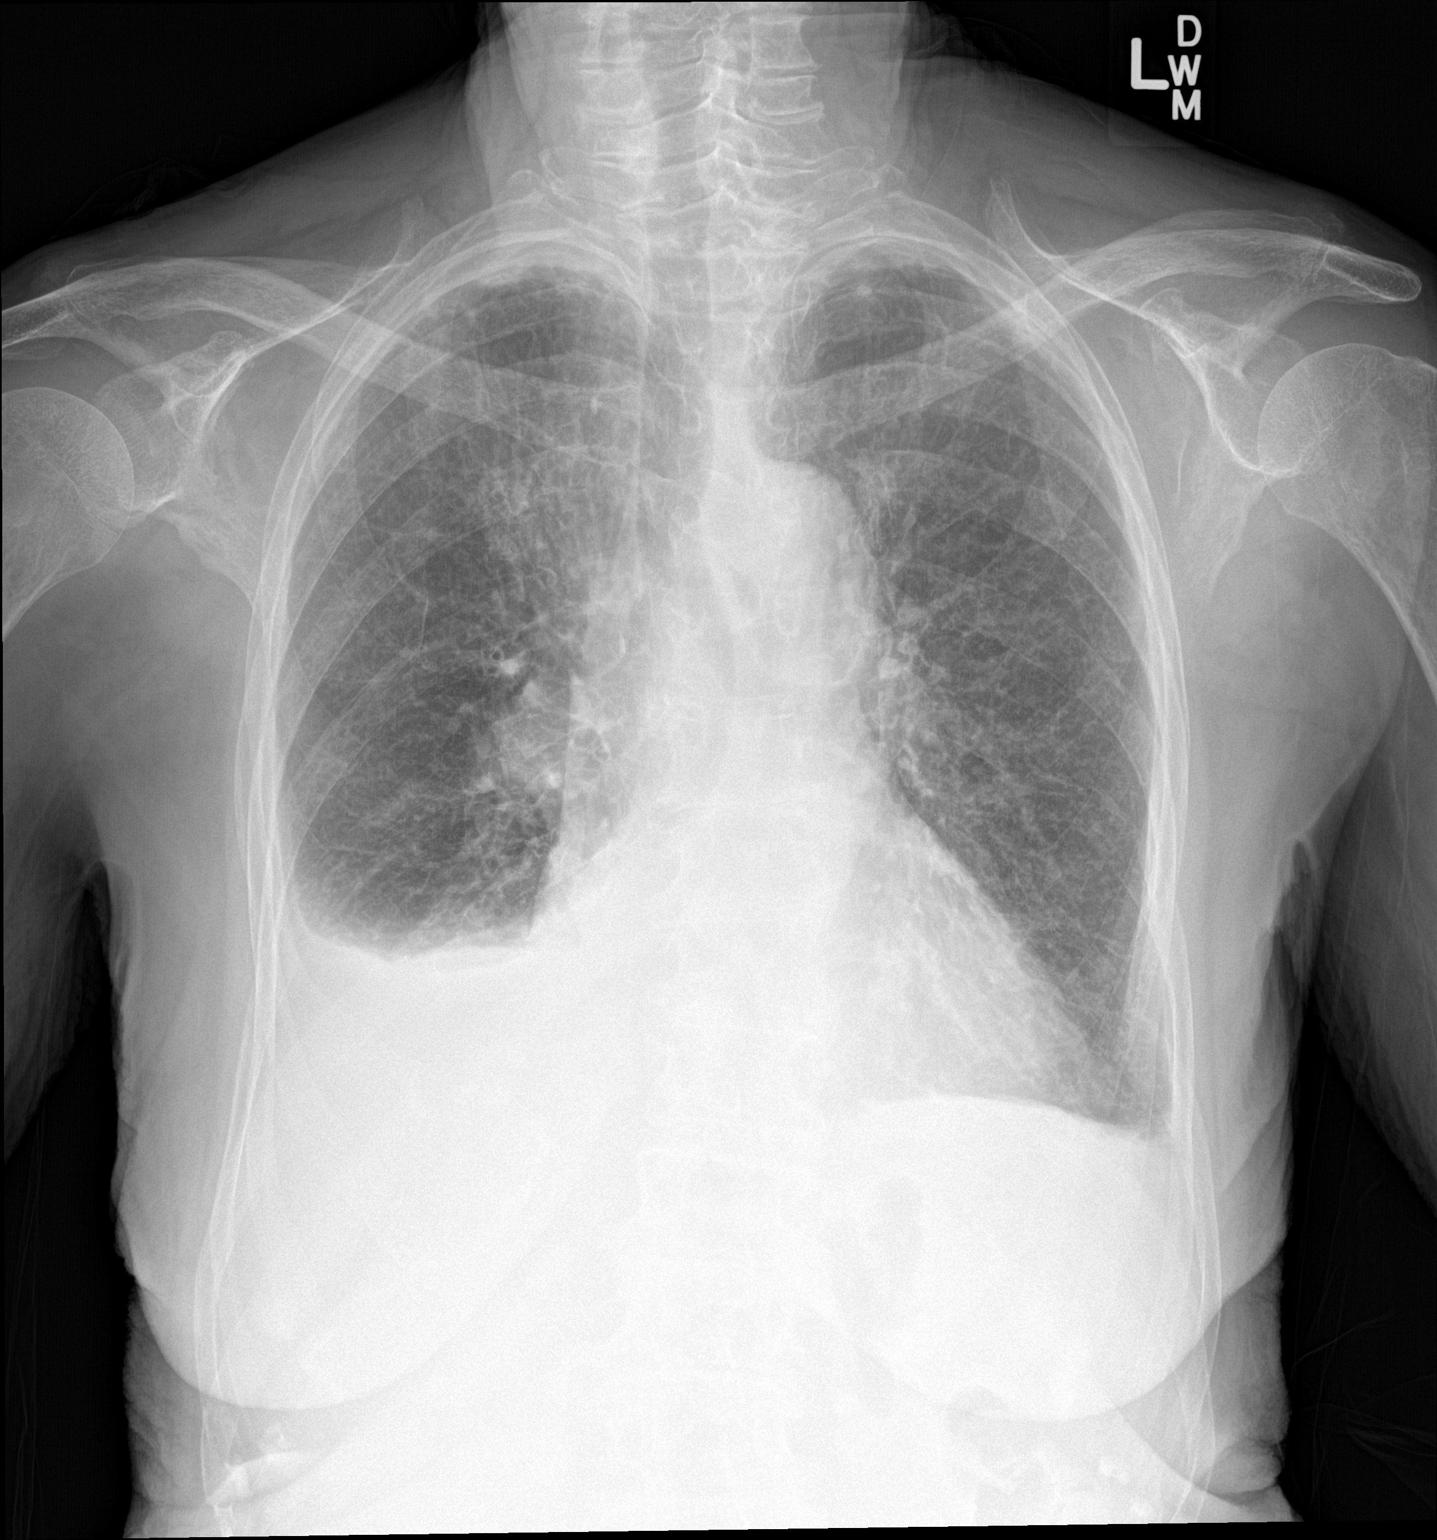

[chest lat]
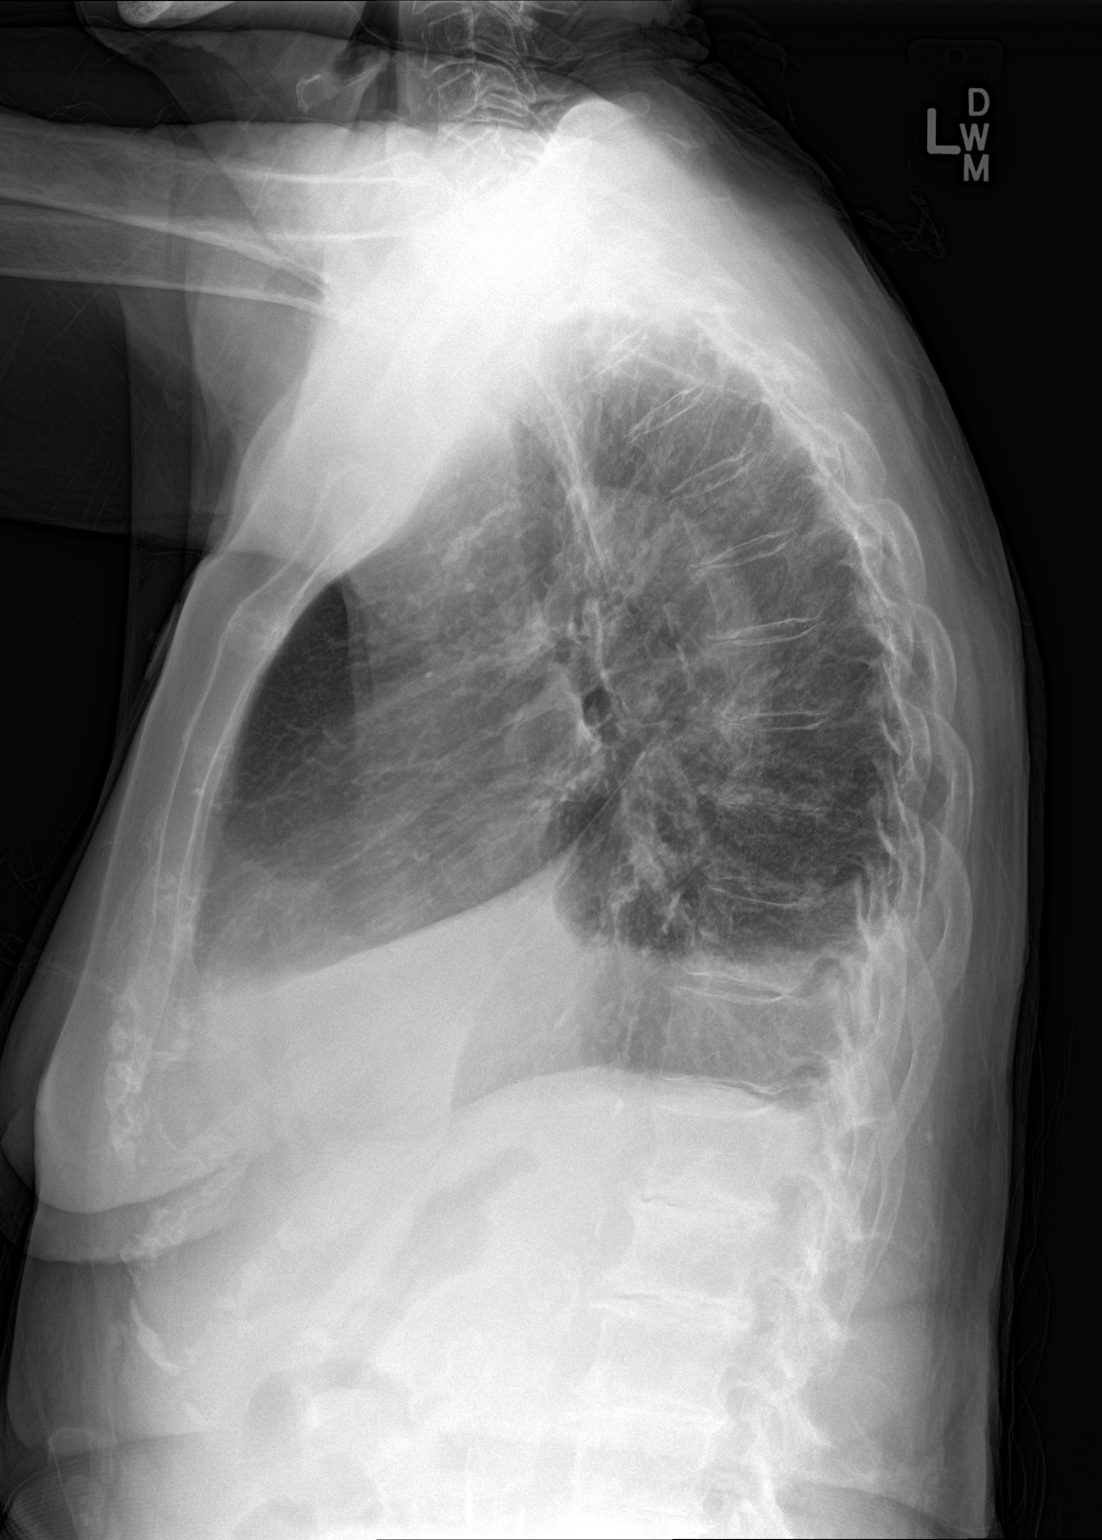

[2 of 2 positions shown; findings below may reference images not displayed]

FINDINGS: There is bilateral diffuse interstitial thickening. There is a small
right pleural effusion. There is a trace left pleural effusion.
There is no focal consolidation or pneumothorax. The heart size is
stable. There is no acute osseous abnormality.
IMPRESSION: Small right and trace left pleural effusions.

## 2016-02-15 IMAGING — CT CT NECK W/ CM
2 of 3 series · 9 of 14 positions shown, 11 images · IV contrast (omnipaque)
Comparison: None.

CLINICAL DATA: Right jaw swelling and pain

EXAM:
CT NECK WITH CONTRAST
TECHNIQUE: Multidetector CT imaging of the neck was performed using the
standard protocol following the bolus administration of intravenous
contrast.
CONTRAST:  75mL OMNIPAQUE IOHEXOL 300 MG/ML  SOLN

[Series 2: neck 2.0 b31s · axial · 0.36mm/px · z∈[-259,-129]mm · 4 of 109 slices shown]
[im 22/109  bone]
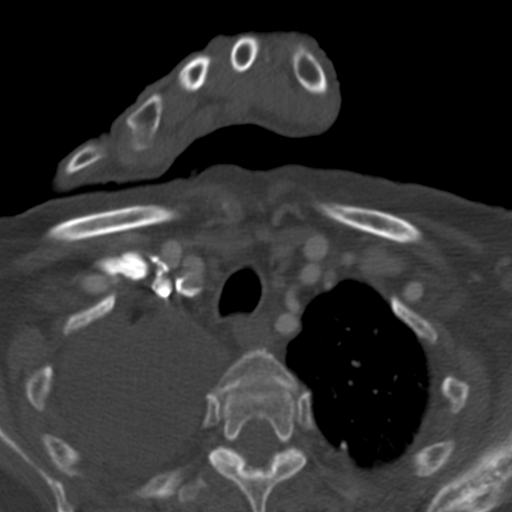
[im 44/109  bone]
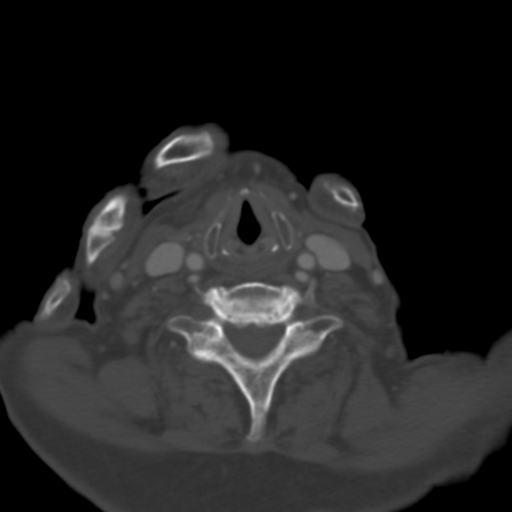
[im 65/109  bone]
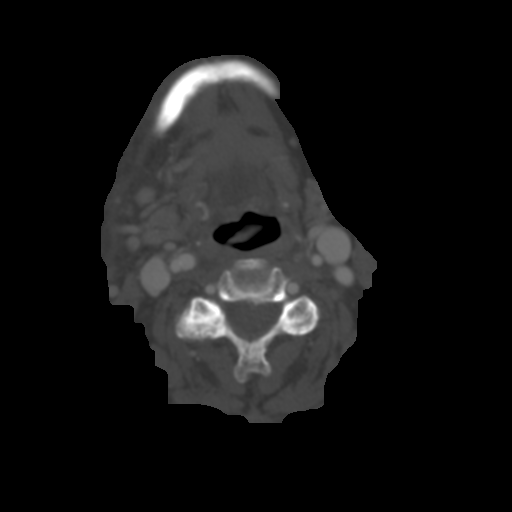
[im 87/109  bone]
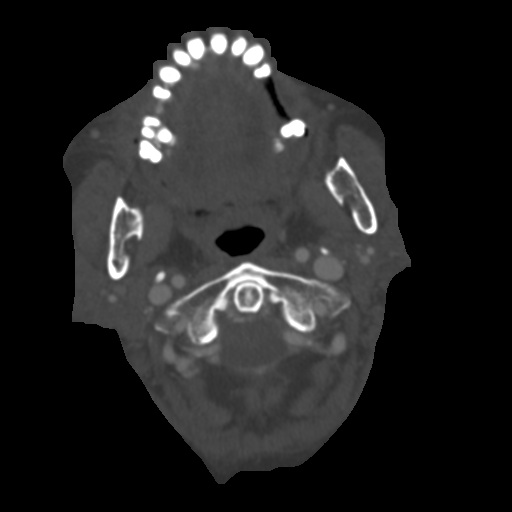

[Series 7: neck 2.0 orth axial to hyoid · axial · 0.27mm/px · z∈[-304,-153]mm · 5 of 121 slices shown, 7 images]
[im 21/121  soft-tissue]
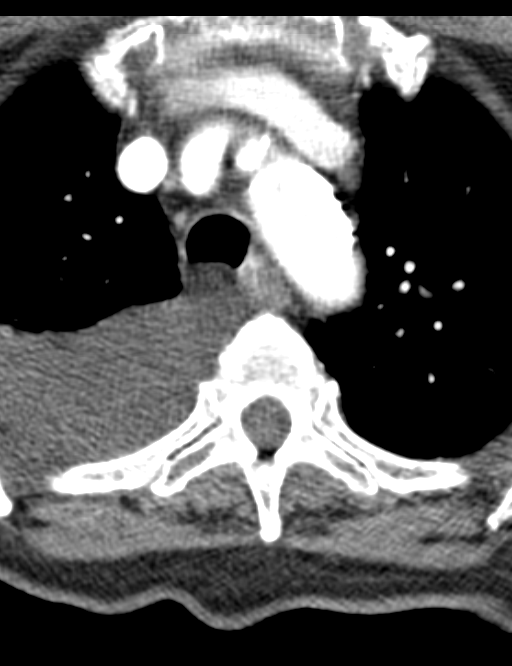
[im 21/121  bone]
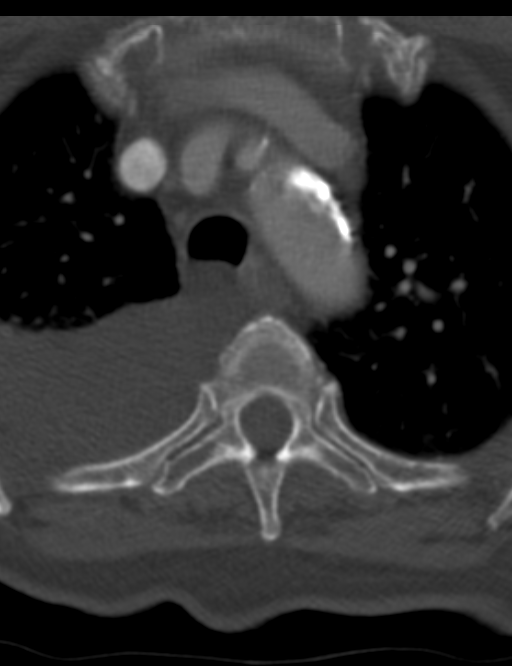
[im 41/121  bone]
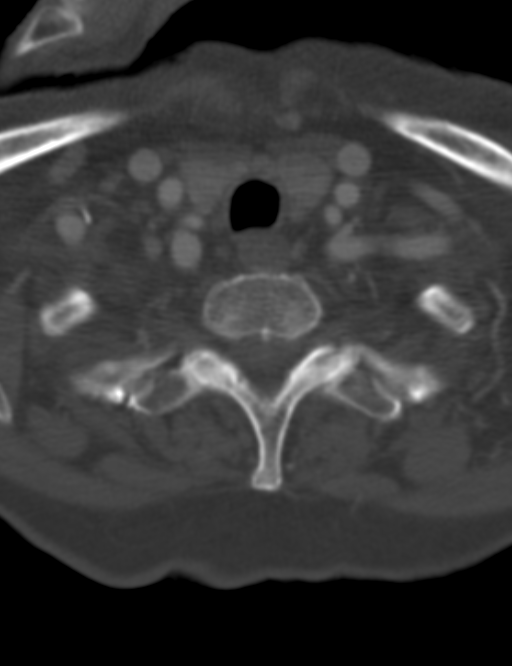
[im 61/121  bone]
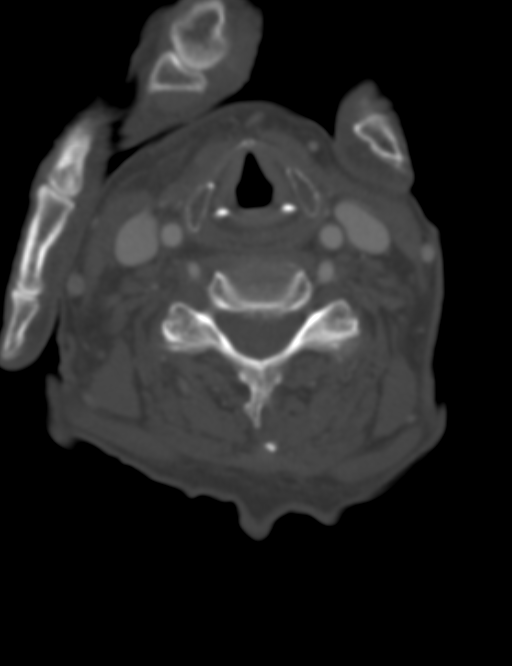
[im 81/121  bone]
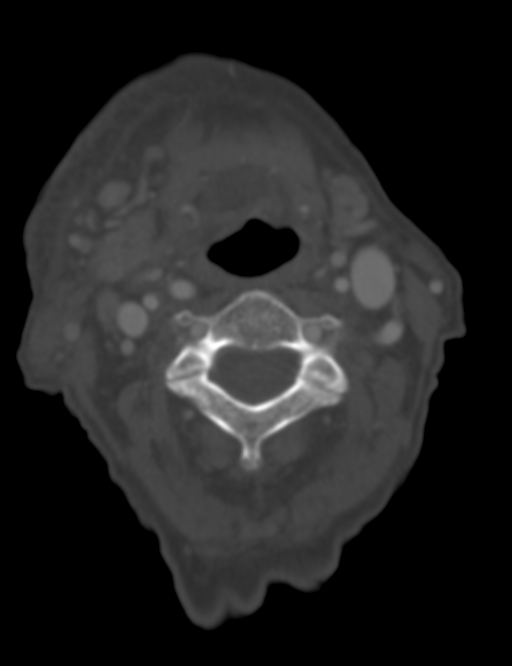
[im 101/121  soft-tissue]
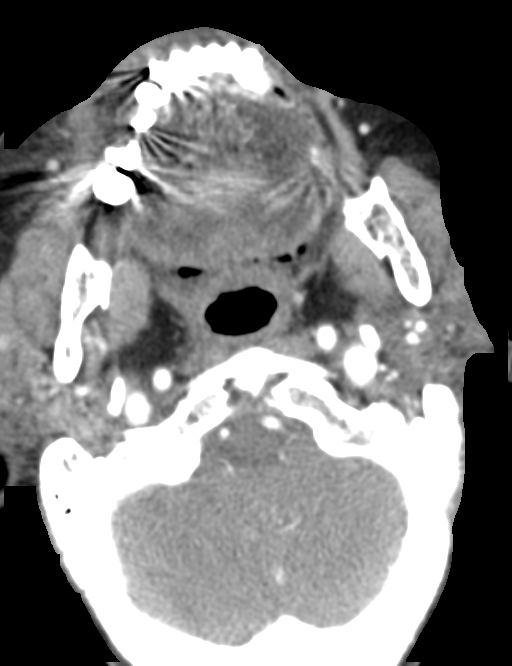
[im 101/121  bone]
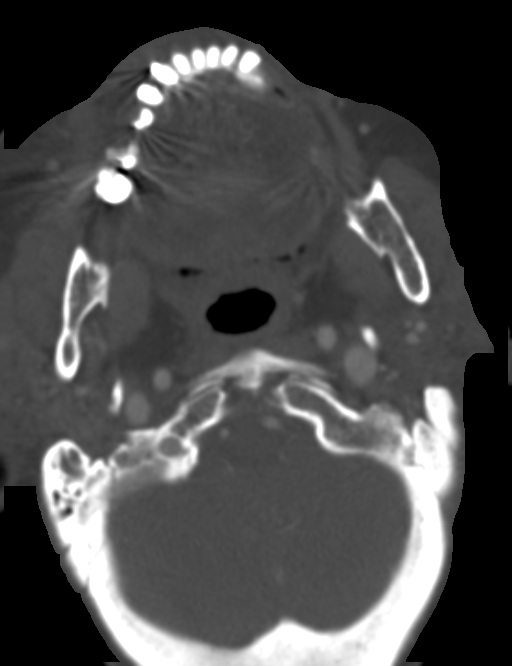

[9 of 14 positions shown; findings below may reference images not displayed]

FINDINGS: Pharynx and larynx: Negative for pharyngeal edema or mass. Larynx
normal.

Soft tissue swelling lateral to the mandible on the right. There is
diffuse edema present without soft tissue abscess. There is a
periapical abscess around the right lower premolar, the most
posterior of the premolars, with fistula through the lateral cortex
the mandible. Findings are consistent with dental infection
extending into the soft tissues with cellulitis but no abscess. No
abscess in the floor of the mouth.

Salivary glands: Parotid and submandibular glands normal
bilaterally.

Thyroid: Negative

Lymph nodes: Negative for adenopathy.

Vascular: Carotid artery and jugular vein patent bilaterally. No
significant carotid stenosis.

Limited intracranial: Negative

Visualized orbits: Negative

Mastoids and visualized paranasal sinuses: Air-fluid level in the
sphenoid sinus. Maxillary sinus clear bilaterally. Mild mastoid
sinus effusion bilaterally.

Skeleton: Cervical spondylosis.  No acute fracture or mass lesion.

Upper chest: Moderately large right pleural effusion with right
lower lobe atelectasis. Left apex clear.
IMPRESSION: Periapical abscess around the right lower pre molar with fistula
through the lateral cortex the mandible. There is cellulitis lateral
to the mandible without soft tissue abscess.

Moderately large right pleural effusion.
